# Patient Record
Sex: Female | Born: 1950 | Race: White | Hispanic: No | Marital: Married | Smoking: Former smoker
Health system: Southern US, Community
[De-identification: ages and names within clinical notes are randomized; demographics above are authoritative.]

## PROBLEM LIST (undated history)

## (undated) DIAGNOSIS — K219 Gastro-esophageal reflux disease without esophagitis: Secondary | ICD-10-CM

## (undated) DIAGNOSIS — K900 Celiac disease: Secondary | ICD-10-CM

## (undated) DIAGNOSIS — J479 Bronchiectasis, uncomplicated: Secondary | ICD-10-CM

## (undated) DIAGNOSIS — M810 Age-related osteoporosis without current pathological fracture: Secondary | ICD-10-CM

## (undated) DIAGNOSIS — K589 Irritable bowel syndrome without diarrhea: Secondary | ICD-10-CM

## (undated) DIAGNOSIS — T7840XA Allergy, unspecified, initial encounter: Secondary | ICD-10-CM

## (undated) HISTORY — DX: Irritable bowel syndrome, unspecified: K58.9

## (undated) HISTORY — PX: TONSILLECTOMY: SUR1361

## (undated) HISTORY — DX: Bronchiectasis, uncomplicated: J47.9

## (undated) HISTORY — PX: BREAST BIOPSY: SHX20

## (undated) HISTORY — DX: Age-related osteoporosis without current pathological fracture: M81.0

## (undated) HISTORY — DX: Celiac disease: K90.0

## (undated) HISTORY — DX: Allergy, unspecified, initial encounter: T78.40XA

## (undated) HISTORY — DX: Gastro-esophageal reflux disease without esophagitis: K21.9

---

## 1998-01-04 ENCOUNTER — Other Ambulatory Visit: Admission: RE | Admit: 1998-01-04 | Discharge: 1998-01-04 | Payer: Self-pay | Admitting: *Deleted

## 1998-01-09 ENCOUNTER — Other Ambulatory Visit: Admission: RE | Admit: 1998-01-09 | Discharge: 1998-01-09 | Payer: Self-pay | Admitting: *Deleted

## 1998-07-05 ENCOUNTER — Other Ambulatory Visit: Admission: RE | Admit: 1998-07-05 | Discharge: 1998-07-05 | Payer: Self-pay | Admitting: *Deleted

## 1999-05-29 ENCOUNTER — Other Ambulatory Visit: Admission: RE | Admit: 1999-05-29 | Discharge: 1999-05-29 | Payer: Self-pay | Admitting: *Deleted

## 2000-06-30 ENCOUNTER — Other Ambulatory Visit: Admission: RE | Admit: 2000-06-30 | Discharge: 2000-06-30 | Payer: Self-pay | Admitting: *Deleted

## 2002-07-14 ENCOUNTER — Other Ambulatory Visit: Admission: RE | Admit: 2002-07-14 | Discharge: 2002-07-14 | Payer: Self-pay | Admitting: *Deleted

## 2002-09-01 HISTORY — PX: COLONOSCOPY: SHX174

## 2010-08-02 ENCOUNTER — Ambulatory Visit: Payer: Self-pay | Admitting: Family Medicine

## 2010-08-04 ENCOUNTER — Ambulatory Visit: Payer: Self-pay | Admitting: Internal Medicine

## 2010-10-16 ENCOUNTER — Ambulatory Visit: Payer: Self-pay | Admitting: Internal Medicine

## 2010-10-17 ENCOUNTER — Ambulatory Visit: Payer: Self-pay | Admitting: Internal Medicine

## 2010-11-23 ENCOUNTER — Ambulatory Visit: Payer: Self-pay | Admitting: Internal Medicine

## 2010-11-24 ENCOUNTER — Emergency Department: Payer: Self-pay | Admitting: Internal Medicine

## 2013-04-01 ENCOUNTER — Ambulatory Visit: Payer: Self-pay | Admitting: Internal Medicine

## 2013-12-05 ENCOUNTER — Emergency Department: Payer: Self-pay | Admitting: Emergency Medicine

## 2013-12-05 LAB — URINALYSIS, COMPLETE
Bacteria: NONE SEEN
Bilirubin,UR: NEGATIVE
Glucose,UR: NEGATIVE mg/dL (ref 0–75)
Leukocyte Esterase: NEGATIVE
Nitrite: NEGATIVE
PROTEIN: NEGATIVE
Ph: 7 (ref 4.5–8.0)
RBC,UR: 13 /HPF (ref 0–5)
Specific Gravity: 1.011 (ref 1.003–1.030)
Squamous Epithelial: <1
WBC UR: 2 /HPF (ref 0–5)

## 2013-12-05 LAB — CBC
HCT: 40.8 % (ref 35.0–47.0)
HGB: 13.7 g/dL (ref 12.0–16.0)
MCH: 30.6 pg (ref 26.0–34.0)
MCHC: 33.6 g/dL (ref 32.0–36.0)
MCV: 91 fL (ref 80–100)
Platelet: 159 10*3/uL (ref 150–440)
RBC: 4.47 10*6/uL (ref 3.80–5.20)
RDW: 12.8 % (ref 11.5–14.5)
WBC: 12.3 10*3/uL — AB (ref 3.6–11.0)

## 2013-12-05 LAB — COMPREHENSIVE METABOLIC PANEL
ALBUMIN: 3.4 g/dL (ref 3.4–5.0)
ANION GAP: 5 — AB (ref 7–16)
Alkaline Phosphatase: 66 U/L
BILIRUBIN TOTAL: 1.4 mg/dL — AB (ref 0.2–1.0)
BUN: 9 mg/dL (ref 7–18)
CREATININE: 0.82 mg/dL (ref 0.60–1.30)
Calcium, Total: 8.5 mg/dL (ref 8.5–10.1)
Chloride: 105 mmol/L (ref 98–107)
Co2: 28 mmol/L (ref 21–32)
Glucose: 111 mg/dL — ABNORMAL HIGH (ref 65–99)
Osmolality: 275 (ref 275–301)
Potassium: 3.6 mmol/L (ref 3.5–5.1)
SGOT(AST): 29 U/L (ref 15–37)
SGPT (ALT): 27 U/L (ref 12–78)
SODIUM: 138 mmol/L (ref 136–145)
TOTAL PROTEIN: 6.6 g/dL (ref 6.4–8.2)

## 2013-12-05 LAB — TROPONIN I: Troponin-I: <0.02 ng/mL

## 2013-12-13 ENCOUNTER — Emergency Department: Payer: Self-pay | Admitting: Emergency Medicine

## 2014-06-28 ENCOUNTER — Ambulatory Visit: Payer: Self-pay | Admitting: Internal Medicine

## 2014-07-05 ENCOUNTER — Ambulatory Visit: Payer: Self-pay | Admitting: Internal Medicine

## 2014-08-03 ENCOUNTER — Ambulatory Visit: Payer: Self-pay | Admitting: Specialist

## 2014-08-14 DIAGNOSIS — R053 Chronic cough: Secondary | ICD-10-CM | POA: Insufficient documentation

## 2014-08-14 DIAGNOSIS — R05 Cough: Secondary | ICD-10-CM | POA: Insufficient documentation

## 2014-09-18 ENCOUNTER — Encounter: Payer: Self-pay | Admitting: Podiatry

## 2014-09-18 ENCOUNTER — Ambulatory Visit (INDEPENDENT_AMBULATORY_CARE_PROVIDER_SITE_OTHER): Payer: BLUE CROSS/BLUE SHIELD | Admitting: Podiatry

## 2014-09-18 VITALS — BP 131/78 | HR 68 | Resp 16 | Ht 68.0 in | Wt 128.0 lb

## 2014-09-18 DIAGNOSIS — M2040 Other hammer toe(s) (acquired), unspecified foot: Secondary | ICD-10-CM

## 2014-09-18 DIAGNOSIS — M7750 Other enthesopathy of unspecified foot: Secondary | ICD-10-CM

## 2014-09-18 DIAGNOSIS — M778 Other enthesopathies, not elsewhere classified: Secondary | ICD-10-CM

## 2014-09-18 DIAGNOSIS — Q6650 Congenital pes planus, unspecified foot: Secondary | ICD-10-CM

## 2014-09-18 DIAGNOSIS — M779 Enthesopathy, unspecified: Secondary | ICD-10-CM

## 2014-09-18 NOTE — Progress Notes (Signed)
   Subjective:    Patient ID: Audrey Hayden, female    DOB: [birth year], 64 y.o.   MRN: [id number]  HPI Comments: Need a new pair of orthotics, have a pair since 2013 from the chiropractor, got them for my back. Not having any foot pain. Just lower back pain     Review of Systems  All other systems reviewed and are negative.      Objective:   Physical Exam: I have reviewed her past medical history medications allergy surgery social history and review of systems. Pulses are palpable bilateral. Neurologic sensorium is intact bilateral. Deep tendon reflexes are intact bilaterally muscle strength +5 over 5 dorsiflexion plantar flexors and inverters everters onto the musculature is intact. Orthopedic evaluation demonstrates mild pes planus bilateral with hammertoe deformities and plantar flexed second metatarsals bilateral. Pain on palpation and range of motion of the second metatarsophalangeal joint bilaterally.        Assessment & Plan:  Assessment: Plantar fasciitis hammertoe deformity with capsulitis second metatarsophalangeal joint bilateral.  Plan: She was scanned for set of orthotics will follow up with her with those come in.

## 2014-10-09 ENCOUNTER — Ambulatory Visit (INDEPENDENT_AMBULATORY_CARE_PROVIDER_SITE_OTHER): Payer: BLUE CROSS/BLUE SHIELD | Admitting: *Deleted

## 2014-10-09 DIAGNOSIS — M778 Other enthesopathies, not elsewhere classified: Secondary | ICD-10-CM

## 2014-10-09 DIAGNOSIS — M7750 Other enthesopathy of unspecified foot: Secondary | ICD-10-CM

## 2014-10-09 DIAGNOSIS — Q6650 Congenital pes planus, unspecified foot: Secondary | ICD-10-CM

## 2014-10-09 DIAGNOSIS — M779 Enthesopathy, unspecified: Secondary | ICD-10-CM

## 2014-10-09 NOTE — Patient Instructions (Signed)

## 2014-10-16 ENCOUNTER — Ambulatory Visit: Payer: Self-pay | Admitting: Unknown Physician Specialty

## 2014-10-16 LAB — HM COLONOSCOPY

## 2014-11-06 ENCOUNTER — Ambulatory Visit: Payer: BLUE CROSS/BLUE SHIELD | Admitting: Podiatry

## 2014-11-08 DIAGNOSIS — Z8719 Personal history of other diseases of the digestive system: Secondary | ICD-10-CM | POA: Insufficient documentation

## 2014-11-13 DIAGNOSIS — IMO0001 Reserved for inherently not codable concepts without codable children: Secondary | ICD-10-CM | POA: Insufficient documentation

## 2014-12-25 LAB — SURGICAL PATHOLOGY

## 2015-02-16 ENCOUNTER — Other Ambulatory Visit: Payer: Self-pay | Admitting: Obstetrics and Gynecology

## 2015-02-16 DIAGNOSIS — N63 Unspecified lump in unspecified breast: Secondary | ICD-10-CM

## 2015-02-19 ENCOUNTER — Other Ambulatory Visit: Payer: Self-pay | Admitting: Obstetrics and Gynecology

## 2015-02-19 DIAGNOSIS — N6315 Unspecified lump in the right breast, overlapping quadrants: Secondary | ICD-10-CM

## 2015-02-19 DIAGNOSIS — N6310 Unspecified lump in the right breast, unspecified quadrant: Principal | ICD-10-CM

## 2015-02-23 ENCOUNTER — Ambulatory Visit
Admission: RE | Admit: 2015-02-23 | Discharge: 2015-02-23 | Disposition: A | Discharge status: Home / Self Care | Payer: BLUE CROSS/BLUE SHIELD | Source: Ambulatory Visit | Attending: Obstetrics and Gynecology | Admitting: Obstetrics and Gynecology

## 2015-02-23 DIAGNOSIS — R928 Other abnormal and inconclusive findings on diagnostic imaging of breast: Secondary | ICD-10-CM | POA: Insufficient documentation

## 2015-02-23 DIAGNOSIS — N6315 Unspecified lump in the right breast, overlapping quadrants: Secondary | ICD-10-CM

## 2015-02-23 DIAGNOSIS — N63 Unspecified lump in unspecified breast: Secondary | ICD-10-CM

## 2015-02-23 DIAGNOSIS — N6310 Unspecified lump in the right breast, unspecified quadrant: Secondary | ICD-10-CM

## 2015-06-19 ENCOUNTER — Other Ambulatory Visit: Payer: Self-pay | Admitting: Internal Medicine

## 2015-06-19 ENCOUNTER — Encounter: Payer: Self-pay | Admitting: Internal Medicine

## 2015-06-19 DIAGNOSIS — R87611 Atypical squamous cells cannot exclude high grade squamous intraepithelial lesion on cytologic smear of cervix (ASC-H): Secondary | ICD-10-CM

## 2015-06-19 DIAGNOSIS — E7889 Other lipoprotein metabolism disorders: Secondary | ICD-10-CM

## 2015-06-19 DIAGNOSIS — J3089 Other allergic rhinitis: Secondary | ICD-10-CM | POA: Insufficient documentation

## 2015-06-19 DIAGNOSIS — R87619 Unspecified abnormal cytological findings in specimens from cervix uteri: Secondary | ICD-10-CM | POA: Insufficient documentation

## 2015-06-19 DIAGNOSIS — M810 Age-related osteoporosis without current pathological fracture: Secondary | ICD-10-CM

## 2015-06-19 DIAGNOSIS — I83893 Varicose veins of bilateral lower extremities with other complications: Secondary | ICD-10-CM | POA: Insufficient documentation

## 2015-11-14 ENCOUNTER — Ambulatory Visit
Admission: RE | Admit: 2015-11-14 | Discharge: 2015-11-14 | Disposition: A | Discharge status: Home / Self Care | Payer: Medicare Other | Source: Ambulatory Visit | Attending: Family Medicine | Admitting: Family Medicine

## 2015-11-14 ENCOUNTER — Encounter: Payer: Self-pay | Admitting: Family Medicine

## 2015-11-14 ENCOUNTER — Telehealth: Payer: Self-pay

## 2015-11-14 ENCOUNTER — Ambulatory Visit (INDEPENDENT_AMBULATORY_CARE_PROVIDER_SITE_OTHER): Payer: Medicare Other | Admitting: Family Medicine

## 2015-11-14 VITALS — BP 112/64 | HR 78 | Temp 98.4°F | Ht 68.0 in | Wt 135.0 lb

## 2015-11-14 DIAGNOSIS — J0100 Acute maxillary sinusitis, unspecified: Secondary | ICD-10-CM | POA: Diagnosis not present

## 2015-11-14 DIAGNOSIS — R059 Cough, unspecified: Secondary | ICD-10-CM

## 2015-11-14 DIAGNOSIS — R05 Cough: Secondary | ICD-10-CM | POA: Insufficient documentation

## 2015-11-14 MED ORDER — DOXYCYCLINE HYCLATE 100 MG PO TABS: 100.0000 mg | ORAL_TABLET | Freq: Two times a day (BID) | ORAL | Status: DC

## 2015-11-14 MED ORDER — BENZONATATE 100 MG PO CAPS: 100.0000 mg | ORAL_CAPSULE | Freq: Two times a day (BID) | ORAL | Status: DC | PRN

## 2015-11-14 NOTE — Progress Notes (Signed)
Name: Audrey Hayden   MRN: [id number]    DOB: 06-28-1951   Date:11/14/2015       Progress Note  Subjective  Chief Complaint  Chief Complaint  Patient presents with  . Cough    Patient states that cough has been constant since Sunday, she states that she has been having nose running. States that she is not sleeping well due to cough. Patient reports husband is an Therapist, sports and heard "Scratching" in Right lower lobe.    Cough This is a new problem. The current episode started in the past 7 days. The problem has been gradually worsening. The cough is productive of blood-tinged sputum. Associated symptoms include chills, ear congestion, headaches, hemoptysis, nasal congestion, postnasal drip, rhinorrhea and wheezing. Pertinent negatives include no chest pain, ear pain, fever, heartburn, myalgias, rash, sore throat, shortness of breath, sweats or weight loss. There is no history of asthma, bronchiectasis, bronchitis, COPD, emphysema, environmental allergies or pneumonia.  Sinusitis This is a new problem. The problem has been gradually worsening since onset. There has been no fever. Associated symptoms include chills, congestion, coughing, headaches and sinus pressure. Pertinent negatives include no ear pain, neck pain, shortness of breath or sore throat. (Maxillary)    No problem-specific assessment & plan notes found for this encounter.   Past Medical History  Diagnosis Date  . Allergy   . Osteoporosis     Past Surgical History  Procedure Laterality Date  . Breast biopsy Left     neg  . Tonsillectomy      Family History  Problem Relation Age of Onset  . CAD Father   . Hypertension Mother     Social History   Social History  . Marital Status: Married    Spouse Name: N/A  . Number of Children: N/A  . Years of Education: N/A   Occupational History  . Not on file.   Social History Main Topics  . Smoking status: Former Research scientist (life sciences)  . Smokeless tobacco: Not on file  . Alcohol  Use: No  . Drug Use: No  . Sexual Activity: Not on file   Other Topics Concern  . Not on file   Social History Narrative    Allergies  Allergen Reactions  . Amoxicillin Other (See Comments)  . Cefdinir     Other reaction(s): Other (See Comments) Bright red face and ankle swelling: possible reaction to the medicine.   . Clarithromycin Other (See Comments)    Causes her to faint.  . Codeine Other (See Comments)    Causes her GI upset.  Marland Kitchen Hydromorphone Hcl Other (See Comments)  . Meperidine Other (See Comments)  . Moxifloxacin Other (See Comments)  . Nsaids     Other reaction(s): Unknown  . Omeprazole Other (See Comments)    Ears ringing with more than 20mg /day  . Other Other (See Comments)    All profins cause her to faint. All the mycins cause her to faint.  . Prednisone Other (See Comments)    Bright red face and ankle swelling: possible reaction to the medicine.  . Pseudoephedrine Hcl     Other reaction(s): Syncope  . Quinolones Other (See Comments)  . Ranitidine     Other reaction(s): Other (See Comments) SE noted with high dosage Dk urine and stools, HA  . Aspirin Hives and Rash  . Montelukast Sodium Palpitations     Review of Systems  Constitutional: Positive for chills. Negative for fever, weight loss and malaise/fatigue.  HENT: Positive for congestion,  postnasal drip, rhinorrhea and sinus pressure. Negative for ear discharge, ear pain and sore throat.   Eyes: Negative for blurred vision.  Respiratory: Positive for cough, hemoptysis and wheezing. Negative for sputum production and shortness of breath.   Cardiovascular: Negative for chest pain, palpitations and leg swelling.  Gastrointestinal: Negative for heartburn, nausea, abdominal pain, diarrhea, constipation, blood in stool and melena.  Genitourinary: Negative for dysuria, urgency, frequency and hematuria.  Musculoskeletal: Negative for myalgias, back pain, joint pain and neck pain.  Skin: Negative for  rash.  Neurological: Positive for headaches. Negative for dizziness, tingling, sensory change and focal weakness.  Endo/Heme/Allergies: Negative for environmental allergies and polydipsia. Does not bruise/bleed easily.  Psychiatric/Behavioral: Negative for depression and suicidal ideas. The patient is not nervous/anxious and does not have insomnia.      Objective  Filed Vitals:   11/14/15 1043  BP: 112/64  Pulse: 78  Temp: 98.4 F (36.9 C)  TempSrc: Oral  Height: 5\' 8"  (1.727 m)  Weight: 135 lb (61.236 kg)  SpO2: 97%    Physical Exam  Constitutional: She is well-developed, well-nourished, and in no distress. No distress.  HENT:  Head: Normocephalic and atraumatic.  Right Ear: External ear normal.  Left Ear: External ear normal.  Nose: Right sinus exhibits maxillary sinus tenderness. Left sinus exhibits maxillary sinus tenderness.  Mouth/Throat: Posterior oropharyngeal erythema present.  Eyes: Conjunctivae and EOM are normal. Pupils are equal, round, and reactive to light. Right eye exhibits no discharge. Left eye exhibits no discharge.  Neck: Normal range of motion. Neck supple. No JVD present. No thyromegaly present.  Cardiovascular: Normal rate, regular rhythm, normal heart sounds and intact distal pulses.  Exam reveals no gallop and no friction rub.   No murmur heard. Pulmonary/Chest: Effort normal. No accessory muscle usage. No tachypnea. No respiratory distress. She has no wheezes. She has rhonchi in the right lower field. She has rales in the right lower field.  Abdominal: Soft. Bowel sounds are normal. She exhibits no mass. There is no tenderness. There is no guarding.  Musculoskeletal: Normal range of motion. She exhibits no edema.  Lymphadenopathy:    She has no cervical adenopathy.  Neurological: She is alert. She has normal reflexes.  Skin: Skin is warm and dry. She is not diaphoretic.  Psychiatric: Mood and affect normal.  Nursing note and vitals  reviewed.     Assessment & Plan  Problem List Items Addressed This Visit    None    Visit Diagnoses    Acute maxillary sinusitis, recurrence not specified    -  Primary    Relevant Medications    doxycycline (VIBRA-TABS) 100 MG tablet    benzonatate (TESSALON) 100 MG capsule    Cough        Relevant Orders    DG Chest 2 View         Dr. Otilio Miu [facility]  11/14/2015

## 2015-11-14 NOTE — Telephone Encounter (Signed)
Spoke with patient. Patient advised of all results and verbalized understanding. Will call back with any future questions or concerns. MAH  

## 2015-11-14 NOTE — Telephone Encounter (Signed)
-----   Message from Juline Patch, MD sent at 11/14/2015  4:21 PM EDT ----- No cardiopulmonary disease

## 2015-11-14 NOTE — Telephone Encounter (Signed)
Left message for patient to call back  

## 2016-02-11 ENCOUNTER — Ambulatory Visit (INDEPENDENT_AMBULATORY_CARE_PROVIDER_SITE_OTHER): Payer: Medicare Other | Admitting: Podiatry

## 2016-02-11 ENCOUNTER — Encounter: Payer: Self-pay | Admitting: Podiatry

## 2016-02-11 VITALS — BP 123/76 | HR 74 | Resp 12

## 2016-02-11 DIAGNOSIS — M722 Plantar fascial fibromatosis: Secondary | ICD-10-CM

## 2016-02-11 DIAGNOSIS — Q6650 Congenital pes planus, unspecified foot: Secondary | ICD-10-CM

## 2016-02-11 NOTE — Progress Notes (Signed)
She presents today for chief complaint of pain to the feet. She states that recently she feels that she has had a weight loss which resulted in a change in structure of her foot which is made her previous orthotics less effective. No change in her past medical history medications or allergies.  Objective: Vital signs are stable alert and oriented 3. Pulses are palpable. No reproducible pain on palpation today.  Assessment: History of plantar fascitis.  Plan: We will have her back for scan of the orthotics Friday.

## 2016-02-15 ENCOUNTER — Ambulatory Visit (INDEPENDENT_AMBULATORY_CARE_PROVIDER_SITE_OTHER): Payer: Medicare Other | Admitting: *Deleted

## 2016-02-15 DIAGNOSIS — M722 Plantar fascial fibromatosis: Secondary | ICD-10-CM | POA: Diagnosis not present

## 2016-02-15 DIAGNOSIS — Q6650 Congenital pes planus, unspecified foot: Secondary | ICD-10-CM

## 2016-02-15 NOTE — Progress Notes (Signed)
Scanned pt for orthotics today

## 2016-03-07 ENCOUNTER — Ambulatory Visit: Payer: Medicare Other

## 2016-04-04 ENCOUNTER — Encounter: Payer: Self-pay | Admitting: *Deleted

## 2016-06-02 DIAGNOSIS — Z23 Encounter for immunization: Secondary | ICD-10-CM | POA: Diagnosis not present

## 2017-03-20 ENCOUNTER — Other Ambulatory Visit: Payer: Self-pay

## 2017-04-20 ENCOUNTER — Ambulatory Visit (INDEPENDENT_AMBULATORY_CARE_PROVIDER_SITE_OTHER): Payer: Medicare Other

## 2017-04-20 ENCOUNTER — Other Ambulatory Visit: Payer: Self-pay | Admitting: Internal Medicine

## 2017-04-20 VITALS — BP 118/68 | HR 70 | Temp 98.3°F | Resp 16 | Ht 68.0 in | Wt 127.4 lb

## 2017-04-20 DIAGNOSIS — Z1211 Encounter for screening for malignant neoplasm of colon: Secondary | ICD-10-CM

## 2017-04-20 DIAGNOSIS — Z0000 Encounter for general adult medical examination without abnormal findings: Secondary | ICD-10-CM | POA: Diagnosis not present

## 2017-04-20 DIAGNOSIS — Z1231 Encounter for screening mammogram for malignant neoplasm of breast: Secondary | ICD-10-CM

## 2017-04-20 NOTE — Progress Notes (Signed)
Subjective:   Audrey Hayden is a 66 y.o. female who presents for Medicare Annual (Subsequent) preventive examination.  Review of Systems:  Cardiac Risk Factors include: advanced age (>59men, >21 women)     Objective:     Vitals: BP 118/68 (BP Location: Left Arm, Patient Position: Sitting)   Pulse 70   Temp 98.3 F (36.8 C)   Resp 16   Ht 5\' 8"  (1.727 m)   Wt 127 lb 6.4 oz (57.8 kg)   BMI 19.37 kg/m   Body mass index is 19.37 kg/m.   Tobacco History  Smoking Status  . Former Smoker  . Quit date: 04/02/1975  Smokeless Tobacco  . Never Used     Counseling given: Not Answered   Past Medical History:  Diagnosis Date  . Allergy   . Osteoporosis    Past Surgical History:  Procedure Laterality Date  . BREAST BIOPSY Left    neg  . TONSILLECTOMY     Family History  Problem Relation Age of Onset  . Hypertension Mother   . CAD Father    History  Sexual Activity  . Sexual activity: Not on file    Outpatient Encounter Prescriptions as of 04/20/2017  Medication Sig  . B Complex-C (B-COMPLEX WITH VITAMIN C) tablet Take 1 tablet by mouth daily.  . Calcium-Phosphorus-Vitamin D (CITRACAL +D3 PO) Take by mouth 2 (two) times daily.  . cetirizine (ZYRTEC) 10 MG tablet Take by mouth.  . Magnesium Citrate 100 MG TABS Take 100 mg by mouth 2 (two) times daily.  . Manganese 10 MG TABS Take 10 mg by mouth daily.  . Misc Natural Products (OSTEO BI-FLEX ADV TRIPLE ST PO) Take by mouth.  . Multiple Vitamin (MULTI-VITAMINS) TABS Take 1 tablet by mouth daily.  Marland Kitchen PANCREATIN PO Take 200 mg by mouth 2 (two) times daily.  Marland Kitchen QUERCETIN PO Take 500 mg by mouth 2 (two) times daily.  . Saccharomyces boulardii (PROBIOTIC) 250 MG CAPS Take 1 tablet by mouth daily.  . vitamin C (ASCORBIC ACID) 500 MG tablet Take 500 mg by mouth daily.  . [DISCONTINUED] benzonatate (TESSALON) 100 MG capsule Take 1 capsule (100 mg total) by mouth 2 (two) times daily as needed for cough.  . [DISCONTINUED]  Calcium-Vitamin D 600-200 MG-UNIT tablet Take 1 tablet by mouth daily.  . [DISCONTINUED] doxycycline (VIBRA-TABS) 100 MG tablet Take 1 tablet (100 mg total) by mouth 2 (two) times daily.  . [DISCONTINUED] esomeprazole (NEXIUM) 20 MG capsule Take 1 capsule by mouth daily.   No facility-administered encounter medications on file as of 04/20/2017.     Activities of Daily Living In your present state of health, do you have any difficulty performing the following activities: 04/20/2017  Hearing? N  Vision? N  Difficulty concentrating or making decisions? N  Walking or climbing stairs? N  Dressing or bathing? N  Doing errands, shopping? N  Preparing Food and eating ? N  Using the Toilet? N  In the past six months, have you accidently leaked urine? N  Do you have problems with loss of bowel control? N  Managing your Medications? N  Managing your Finances? N  Housekeeping or managing your Housekeeping? N  Some recent data might be hidden    Patient Care Team: Glean Hess, MD as PCP - General (Family Medicine) Benjaman Kindler, MD as Consulting Physician (Obstetrics and Gynecology)    Assessment:     Exercise Activities and Dietary recommendations Current Exercise Habits: Home exercise routine,  Type of exercise: walking;stretching, Time (Minutes): 45, Frequency (Times/Week): 7, Weekly Exercise (Minutes/Week): 315, Intensity: Mild, Exercise limited by: None identified  Goals    None     Fall Risk Fall Risk  04/20/2017 11/14/2015  Falls in the past year? No No   Depression Screen PHQ 2/9 Scores 04/20/2017 11/14/2015  PHQ - 2 Score 0 0     Cognitive Function     6CIT Screen 04/20/2017  What Year? 0 points  What month? 0 points  What time? 0 points  Count back from 20 0 points  Months in reverse 0 points  Repeat phrase 0 points  Total Score 0    Immunization History  Administered Date(s) Administered  . Influenza-Unspecified 07/06/2015   Screening Tests Health  Maintenance  Topic Date Due  . INFLUENZA VACCINE  04/01/2017  . MAMMOGRAM  07/02/2017 (Originally 02/22/2017)  . Hepatitis C Screening  08/01/2017 (Originally [birth year])  . PNA vac Low Risk Adult (1 of 2 - PCV13) 08/01/2017 (Originally 07/28/2015)  . TETANUS/TDAP  05/02/2018 (Originally 07/27/1969)  . COLONOSCOPY  10/16/2024  . DEXA SCAN  Completed      Plan:     I have personally reviewed and addressed the Medicare Annual Wellness questionnaire and have noted the following in the patient's chart:  A. Medical and social history B. Use of alcohol, tobacco or illicit drugs  C. Current medications and supplements D. Functional ability and status E.  Nutritional status F.  Physical activity G. Advance directives H. List of other physicians I.  Hospitalizations, surgeries, and ER visits in previous 12 months J.  [place] such as hearing and vision if needed, cognitive and depression L. Referrals and appointments   In addition, I have reviewed and discussed with patient certain preventive protocols, quality metrics, and best practice recommendations. A written personalized care plan for preventive services as well as general preventive health recommendations were provided to patient.   Signed,  Tyler Aas, LPN Nurse Health Advisor   MD Recommendations:

## 2017-04-20 NOTE — Patient Instructions (Signed)
Audrey Hayden , Thank you for taking time to come for your Medicare Wellness Visit. I appreciate your ongoing commitment to your health goals. Please review the following plan we discussed and let me know if I can assist you in the future.   Screening recommendations/referrals: Colonoscopy: completed 10/16/2014 Mammogram: completed 02/23/2015- due now, call to schedule  Bone Density: completed 10/17/2010 Recommended yearly ophthalmology/optometry visit for glaucoma screening and checkup Recommended yearly dental visit for hygiene and checkup  Vaccinations: Influenza vaccine: due 05/2017 Pneumococcal vaccine: due now Tdap vaccine: due, check with your insurance company for coverage Shingles vaccine: due, check with your insurance company for coverage  Advanced directives: Please bring a copy of your health care power of attorney and living will to the office at your convenience.  Conditions/risks identified: none   Next appointment:  Follow up in one year for your annual wellness exam.   Preventive Care 65 Years and Older, Female Preventive care refers to lifestyle choices and visits with your health care provider that can promote health and wellness. What does preventive care include?  A yearly physical exam. This is also called an annual well check.  Dental exams once or twice a year.  Routine eye exams. Ask your health care provider how often you should have your eyes checked.  Personal lifestyle choices, including:  Daily care of your teeth and gums.  Regular physical activity.  Eating a healthy diet.  Avoiding tobacco and drug use.  Limiting alcohol use.  Practicing safe sex.  Taking low-dose aspirin every day.  Taking vitamin and mineral supplements as recommended by your health care provider. What happens during an annual well check? The services and screenings done by your health care provider during your annual well check will depend on your age, overall health,  lifestyle risk factors, and family history of disease. Counseling  Your health care provider may ask you questions about your:  Alcohol use.  Tobacco use.  Drug use.  Emotional well-being.  Home and relationship well-being.  Sexual activity.  Eating habits.  History of falls.  Memory and ability to understand (cognition).  Work and work Statistician.  Reproductive health. Screening  You may have the following tests or measurements:  Height, weight, and BMI.  Blood pressure.  Lipid and cholesterol levels. These may be checked every 5 years, or more frequently if you are over 66 years old.  Skin check.  Lung cancer screening. You may have this screening every year starting at age 16 if you have a 30-pack-year history of smoking and currently smoke or have quit within the past 15 years.  Fecal occult blood test (FOBT) of the stool. You may have this test every year starting at age 22.  Flexible sigmoidoscopy or colonoscopy. You may have a sigmoidoscopy every 5 years or a colonoscopy every 10 years starting at age 55.  Hepatitis C blood test.  Hepatitis B blood test.  Sexually transmitted disease (STD) testing.  Diabetes screening. This is done by checking your blood sugar (glucose) after you have not eaten for a while (fasting). You may have this done every 1-3 years.  Bone density scan. This is done to screen for osteoporosis. You may have this done starting at age 54.  Mammogram. This may be done every 1-2 years. Talk to your health care provider about how often you should have regular mammograms. Talk with your health care provider about your test results, treatment options, and if necessary, the need for more tests. Vaccines  Your health care provider may recommend certain vaccines, such as:  Influenza vaccine. This is recommended every year.  Tetanus, diphtheria, and acellular pertussis (Tdap, Td) vaccine. You may need a Td booster every 10 years.  Zoster  vaccine. You may need this after age 66.  Pneumococcal 13-valent conjugate (PCV13) vaccine. One dose is recommended after age 21.  Pneumococcal polysaccharide (PPSV23) vaccine. One dose is recommended after age 70. Talk to your health care provider about which screenings and vaccines you need and how often you need them. This information is not intended to replace advice given to you by your health care provider. Make sure you discuss any questions you have with your health care provider. Document Released: 09/14/2015 Document Revised: 05/07/2016 Document Reviewed: 06/19/2015 Elsevier Interactive Patient Education  2017 [place] Prevention in the Home Falls can cause injuries. They can happen to people of all ages. There are many things you can do to make your home safe and to help prevent falls. What can I do on the outside of my home?  Regularly fix the edges of walkways and driveways and fix any cracks.  Remove anything that might make you trip as you walk through a door, such as a raised step or threshold.  Trim any bushes or trees on the path to your home.  Use bright outdoor lighting.  Clear any walking paths of anything that might make someone trip, such as rocks or tools.  Regularly check to see if handrails are loose or broken. Make sure that both sides of any steps have handrails.  Any raised decks and porches should have guardrails on the edges.  Have any leaves, snow, or ice cleared regularly.  Use sand or salt on walking paths during winter.  Clean up any spills in your garage right away. This includes oil or grease spills. What can I do in the bathroom?  Use night lights.  Install grab bars by the toilet and in the tub and shower. Do not use towel bars as grab bars.  Use non-skid mats or decals in the tub or shower.  If you need to sit down in the shower, use a plastic, non-slip stool.  Keep the floor dry. Clean up any water that spills on the  floor as soon as it happens.  Remove soap buildup in the tub or shower regularly.  Attach bath mats securely with double-sided non-slip rug tape.  Do not have throw rugs and other things on the floor that can make you trip. What can I do in the bedroom?  Use night lights.  Make sure that you have a light by your bed that is easy to reach.  Do not use any sheets or blankets that are too big for your bed. They should not hang down onto the floor.  Have a firm chair that has side arms. You can use this for support while you get dressed.  Do not have throw rugs and other things on the floor that can make you trip. What can I do in the kitchen?  Clean up any spills right away.  Avoid walking on wet floors.  Keep items that you use a lot in easy-to-reach places.  If you need to reach something above you, use a strong step stool that has a grab bar.  Keep electrical cords out of the way.  Do not use floor polish or wax that makes floors slippery. If you must use wax, use non-skid floor wax.  Do  not have throw rugs and other things on the floor that can make you trip. What can I do with my stairs?  Do not leave any items on the stairs.  Make sure that there are handrails on both sides of the stairs and use them. Fix handrails that are broken or loose. Make sure that handrails are as long as the stairways.  Check any carpeting to make sure that it is firmly attached to the stairs. Fix any carpet that is loose or worn.  Avoid having throw rugs at the top or bottom of the stairs. If you do have throw rugs, attach them to the floor with carpet tape.  Make sure that you have a light switch at the top of the stairs and the bottom of the stairs. If you do not have them, ask someone to add them for you. What else can I do to help prevent falls?  Wear shoes that:  Do not have high heels.  Have rubber bottoms.  Are comfortable and fit you well.  Are closed at the toe. Do not wear  sandals.  If you use a stepladder:  Make sure that it is fully opened. Do not climb a closed stepladder.  Make sure that both sides of the stepladder are locked into place.  Ask someone to hold it for you, if possible.  Clearly mark and make sure that you can see:  Any grab bars or handrails.  First and last steps.  Where the edge of each step is.  Use tools that help you move around (mobility aids) if they are needed. These include:  Canes.  Walkers.  Scooters.  Crutches.  Turn on the lights when you go into a dark area. Replace any light bulbs as soon as they burn out.  Set up your furniture so you have a clear path. Avoid moving your furniture around.  If any of your floors are uneven, fix them.  If there are any pets around you, be aware of where they are.  Review your medicines with your doctor. Some medicines can make you feel dizzy. This can increase your chance of falling. Ask your doctor what other things that you can do to help prevent falls. This information is not intended to replace advice given to you by your health care provider. Make sure you discuss any questions you have with your health care provider. Document Released: 06/14/2009 Document Revised: 01/24/2016 Document Reviewed: 09/22/2014 Elsevier Interactive Patient Education  2017 [employer].

## 2017-05-14 ENCOUNTER — Other Ambulatory Visit: Payer: Self-pay | Admitting: Obstetrics and Gynecology

## 2017-05-14 DIAGNOSIS — Z0000 Encounter for general adult medical examination without abnormal findings: Secondary | ICD-10-CM | POA: Diagnosis not present

## 2017-05-14 DIAGNOSIS — Z01419 Encounter for gynecological examination (general) (routine) without abnormal findings: Secondary | ICD-10-CM | POA: Diagnosis not present

## 2017-05-14 DIAGNOSIS — Z1231 Encounter for screening mammogram for malignant neoplasm of breast: Secondary | ICD-10-CM

## 2017-06-10 ENCOUNTER — Ambulatory Visit
Admission: RE | Admit: 2017-06-10 | Discharge: 2017-06-10 | Disposition: A | Discharge status: Home / Self Care | Payer: Medicare Other | Source: Ambulatory Visit | Attending: Internal Medicine | Admitting: Internal Medicine

## 2017-06-10 ENCOUNTER — Ambulatory Visit (INDEPENDENT_AMBULATORY_CARE_PROVIDER_SITE_OTHER): Payer: Medicare Other | Admitting: Internal Medicine

## 2017-06-10 ENCOUNTER — Encounter: Payer: Self-pay | Admitting: Internal Medicine

## 2017-06-10 ENCOUNTER — Other Ambulatory Visit: Payer: Self-pay | Admitting: Internal Medicine

## 2017-06-10 VITALS — BP 116/64 | HR 60 | Ht 68.0 in | Wt 128.0 lb

## 2017-06-10 DIAGNOSIS — M810 Age-related osteoporosis without current pathological fracture: Secondary | ICD-10-CM | POA: Diagnosis not present

## 2017-06-10 DIAGNOSIS — M4854XA Collapsed vertebra, not elsewhere classified, thoracic region, initial encounter for fracture: Secondary | ICD-10-CM | POA: Diagnosis not present

## 2017-06-10 DIAGNOSIS — M546 Pain in thoracic spine: Secondary | ICD-10-CM | POA: Diagnosis not present

## 2017-06-10 NOTE — Progress Notes (Signed)
Date:  06/10/2017   Name:  Audrey Hayden   DOB:  26-Sep-1950   MRN:  [id number]   Chief Complaint: Back Pain (Hurt back on 05/10/2017. Reached in the back seat of the car to get water without turning body. Thought would get better. Pain is located in upper back and directly on spine. Cannot wear bra at times because elastic bothers her. Pain spreads accross back and having stinging sensation.  )  Back Pain  This is a new problem. The current episode started 1 to 4 weeks ago. The problem occurs constantly. The problem is unchanged. The pain is present in the thoracic spine. The pain is moderate. The symptoms are aggravated by twisting, coughing and bending. Associated symptoms include numbness (tingling). Pertinent negatives include no chest pain, fever or weakness. Risk factors include history of osteoporosis. She has tried analgesics and heat for the symptoms.  She is able to sleep at night with no pain. Feels pretty good in the morning but if she moves around things began to become more uncomfortable. It does not hurt to breathe or move. She is able do her usual household activities without difficulty. She has a diagnosis of osteoporosis, last bone density done in 2012. She was treated with bisphosphonates but did not tolerate them so discontinued.    Review of Systems  Constitutional: Negative for chills, fatigue and fever.  Respiratory: Negative for chest tightness, shortness of breath and wheezing.   Cardiovascular: Negative for chest pain and palpitations.  Musculoskeletal: Positive for back pain.  Neurological: Positive for numbness (tingling). Negative for dizziness and weakness.    Patient Active Problem List   Diagnosis Date Noted  . Environmental and seasonal allergies 06/19/2015  . 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 Osteoporosis 06/19/2015  . Abnormal Pap smear of cervix 06/19/2015  . Elevated HDL 06/19/2015  . Can't get food down 11/13/2014  .  History of gastritis 11/08/2014  . Cough, persistent 08/14/2014    Prior to Admission medications   Medication Sig Start Date End Date Taking? Authorizing Provider  B Complex-C (B-COMPLEX WITH VITAMIN C) tablet Take 1 tablet by mouth 2 (two) times daily.    Yes [provider]  Calcium-Phosphorus-Vitamin D (CITRACAL +D3 PO) Take by mouth 2 (two) times daily.   Yes [provider]  cetirizine (ZYRTEC) 10 MG tablet Take by mouth.   Yes [provider]  Magnesium Citrate 100 MG TABS Take 100 mg by mouth 2 (two) times daily.   Yes [provider]  Manganese 10 MG TABS Take 10 mg by mouth daily.   Yes [provider]  Misc Natural Products (OSTEO BI-FLEX ADV TRIPLE ST PO) Take by mouth.   Yes [provider]  Multiple Vitamin (MULTI-VITAMINS) TABS Take 1 tablet by mouth daily.   Yes [provider]  PANCREATIN PO Take 200 mg by mouth 2 (two) times daily.   Yes [provider]  QUERCETIN PO Take 500 mg by mouth 2 (two) times daily.   Yes [provider]  Saccharomyces boulardii (PROBIOTIC) 250 MG CAPS Take 1 tablet by mouth daily.   Yes [provider]  vitamin C (ASCORBIC ACID) 500 MG tablet Take 500 mg by mouth daily.   Yes [provider]    Allergies  Allergen Reactions  . Amoxicillin Other (See Comments)  . Cefdinir     Other reaction(s): Other (See Comments) Bright red face and ankle swelling: possible reaction to  the medicine.   . Clarithromycin Other (See Comments)    Causes her to faint.  . Codeine Other (See Comments)    Causes her GI upset.  Marland Kitchen Hydromorphone Hcl Other (See Comments)  . Meperidine Other (See Comments)  . Moxifloxacin Other (See Comments)  . Nsaids     Other reaction(s): Unknown  . Omeprazole Other (See Comments)    Ears ringing with more than 20mg /day  . Other Other (See Comments)    All profins cause her to faint. All the mycins cause her to faint.  .  Prednisone Other (See Comments)    Bright red face and ankle swelling: possible reaction to the medicine.  . Pseudoephedrine Hcl     Other reaction(s): Syncope  . Quinolones Other (See Comments)  . Ranitidine     Other reaction(s): Other (See Comments) SE noted with high dosage Dk urine and stools, HA  . Aspirin Hives and Rash  . Montelukast Sodium Palpitations    Past Surgical History:  Procedure Laterality Date  . BREAST BIOPSY Left    neg  . TONSILLECTOMY      Social History  Substance Use Topics  . Smoking status: Former Smoker    Quit date: 04/02/1975  . Smokeless tobacco: Never Used  . Alcohol use No     Medication list has been reviewed and updated.  PHQ 2/9 Scores 04/20/2017 11/14/2015  PHQ - 2 Score 0 0    Physical Exam  Constitutional: She is oriented to person, place, and time. She appears well-developed. No distress.  HENT:  Head: Normocephalic and atraumatic.  Neck: Normal range of motion. Neck supple. No thyromegaly present.  Cardiovascular: Normal rate, regular rhythm and normal heart sounds.   Pulmonary/Chest: Effort normal and breath sounds normal. No respiratory distress.  Musculoskeletal: She exhibits no edema.       Cervical back: Normal.       Thoracic back: She exhibits bony tenderness. She exhibits no spasm.  Neurological: She is alert and oriented to person, place, and time.  Skin: Skin is warm and dry. No rash noted.  Psychiatric: She has a normal mood and affect. Her behavior is normal. Thought content normal.  Nursing note and vitals reviewed.   BP 116/64   Pulse 60   Ht 5\' 8"  (1.727 m)   Wt 128 lb (58.1 kg)   SpO2 100%   BMI 19.46 kg/m   Assessment and Plan: 1. Acute midline thoracic back pain Suspect compression fracture Continue tylenol as needed with heat Consider kyphoplasty if pain is persistent - DG Thoracic Spine 4V; Future  2. Osteoporosis, unspecified osteoporosis type, unspecified pathological fracture presence Did  not tolerated oral bisphosphonates - DG Bone Density; Future   No orders of the defined types were placed in this encounter.   Partially dictated using Editor, commissioning. Any errors are unintentional.  Halina Maidens, MD [facility]  06/10/2017

## 2017-06-10 NOTE — Patient Instructions (Signed)
Spinal Compression Fracture A spinal compression fracture is a collapse of the bones that form the spine (vertebrae). With this type of fracture, the vertebrae become squashed (compressed) into a wedge shape. Most compression fractures happen in the middle or lower part of the spine. What are the causes? This condition may be caused by:  Thinning and loss of density in the bones (osteoporosis). This is the most common cause.  A fall.  A car or motorcycle accident.  Cancer.  Trauma, such as a heavy, direct hit to the head.  What increases the risk? You may be at greater risk for a spinal compression fracture if you:  Are 31 years old or older.  Have osteoporosis.  Have certain types of cancer, including: ? Multiple myeloma. ? Lymphoma. ? Prostate cancer. ? Lung cancer. ? Breast cancer.  What are the signs or symptoms? Symptoms of this condition include:  Severe pain.  Pain that gets worse over time.  Pain that is worse when you stand, walk, sit, or bend.  Sudden pain that is so bad that it is hard for you to move.  Bending or humping of the spine.  Gradual loss of height.  Numbness, tingling, or weakness in the back and legs.  Trouble walking.  Your symptoms will depend on the cause of the fracture and how quickly it develops. For example, fractures that are caused by osteoporosis can cause few symptoms, no symptoms, or symptoms that develop slowly over time. How is this diagnosed? This condition may be diagnosed based on symptoms, medical history, and a physical exam. During the physical exam, your health care provider may tap along the length of your spine to check for tenderness. Tests may be done to confirm the diagnosis. They may include:  A bone density test to check for osteoporosis.  Imaging tests, such as a spine X-ray, a CT scan, or MRI.  How is this treated? Treatment for this condition depends on the cause and severity of the condition.Some  fractures, such as those that are caused by osteoporosis, may heal on their own with supportive care. This may include:  Pain medicine.  Rest.  A back brace.  Physical therapy exercises.  Medicine that reduces bone pain.  Calcium and vitamin D supplements.  Fractures that cause the back to become misshapen, cause nerve pain or weakness, or do not respond to other treatment may be treated with a surgical procedure, such as:  Vertebroplasty. In this procedure, bone cement is injected into the collapsed vertebrae to stabilize them.  Balloon kyphoplasty. In this procedure, the collapsed vertebrae are expanded with a balloon and then bone cement is injected into them.  Spinal fusion. In this procedure, the collapsed vertebrae are connected (fused) to normal vertebrae.  Follow these instructions at home: General instructions  Take medicines only as directed by your health care provider.  Do not drive or operate heavy machinery while taking pain medicine.  If directed, apply ice to the injured area: ? Put ice in a plastic bag. ? Place a towel between your skin and the bag. ? Leave the ice on for 30 minutes every two hours at first. Then apply the ice as needed.  Wear your neck brace or back brace as directed by your health care provider.  Do not drink alcohol. Alcohol can interfere with your treatment.  Keep all follow-up visits as directed by your health care provider. This is important. It can help to prevent permanent injury, disability, and long-lasting (chronic) pain.  Activity  Stay in bed (on bed rest) only as directed by your health care provider. Being on bed rest for too long can make your condition worse.  Return to your normal activities as directed by your health care provider. Ask what activities are safe for you.  Do exercises to improve motion and strength in your back (physical therapy), as recommended by your health care provider.  Exercise regularly as  directed by your health care provider. Contact a health care provider if:  You have a fever.  You develop a cough that makes your pain worse.  Your pain medicine is not helping.  Your pain does not get better over time.  You cannot return to your normal activities as planned or expected. Get help right away if:  Your pain is very bad and it suddenly gets worse.  You are unable to move any body part (paralysis) that is below the level of your injury.  You have numbness, tingling, or weakness in any body part that is below the level of your injury.  You cannot control your bladder or bowels. This information is not intended to replace advice given to you by your health care provider. Make sure you discuss any questions you have with your health care provider. Document Released: 08/18/2005 Document Revised: 04/15/2016 Document Reviewed: 08/22/2014 Elsevier Interactive Patient Education  [employer].

## 2017-06-16 ENCOUNTER — Telehealth: Payer: Self-pay

## 2017-06-16 ENCOUNTER — Other Ambulatory Visit: Payer: Self-pay | Admitting: Internal Medicine

## 2017-06-16 DIAGNOSIS — Z23 Encounter for immunization: Secondary | ICD-10-CM | POA: Diagnosis not present

## 2017-06-16 DIAGNOSIS — M546 Pain in thoracic spine: Secondary | ICD-10-CM

## 2017-06-16 NOTE — Telephone Encounter (Signed)
Called patient and informed her MRI is scheduled for 06/30/2017 At 10:15AM here in [place]. Needs to Arrive at 9:45AM.

## 2017-06-16 NOTE — Telephone Encounter (Signed)
I have ordered an MRI of thoracic spine.

## 2017-06-16 NOTE — Telephone Encounter (Signed)
Patient informed of MRI order placed. Told someone will contact her with scheduling.

## 2017-06-16 NOTE — Telephone Encounter (Signed)
Patient called stating back pain is getting no better. Called to see if she could get a cervical, thoracic, and lumbar MRI. Her son is a doctor and she stated he wanted to know if she could have this ordered. Please Advise.

## 2017-06-26 ENCOUNTER — Ambulatory Visit
Admission: RE | Admit: 2017-06-26 | Discharge: 2017-06-26 | Disposition: A | Discharge status: Home / Self Care | Payer: Medicare Other | Source: Ambulatory Visit | Attending: Internal Medicine | Admitting: Internal Medicine

## 2017-06-26 DIAGNOSIS — M5134 Other intervertebral disc degeneration, thoracic region: Secondary | ICD-10-CM | POA: Diagnosis not present

## 2017-06-26 DIAGNOSIS — M47814 Spondylosis without myelopathy or radiculopathy, thoracic region: Secondary | ICD-10-CM | POA: Diagnosis not present

## 2017-06-26 DIAGNOSIS — D759 Disease of blood and blood-forming organs, unspecified: Secondary | ICD-10-CM | POA: Diagnosis not present

## 2017-06-26 DIAGNOSIS — M438X4 Other specified deforming dorsopathies, thoracic region: Secondary | ICD-10-CM | POA: Diagnosis not present

## 2017-06-26 DIAGNOSIS — M546 Pain in thoracic spine: Secondary | ICD-10-CM | POA: Diagnosis not present

## 2017-06-29 ENCOUNTER — Other Ambulatory Visit: Payer: Self-pay | Admitting: Internal Medicine

## 2017-06-29 ENCOUNTER — Ambulatory Visit
Admission: RE | Admit: 2017-06-29 | Discharge: 2017-06-29 | Disposition: A | Discharge status: Home / Self Care | Payer: Medicare Other | Source: Ambulatory Visit | Attending: Internal Medicine | Admitting: Internal Medicine

## 2017-06-29 DIAGNOSIS — Z780 Asymptomatic menopausal state: Secondary | ICD-10-CM | POA: Diagnosis not present

## 2017-06-29 DIAGNOSIS — R937 Abnormal findings on diagnostic imaging of other parts of musculoskeletal system: Secondary | ICD-10-CM

## 2017-06-29 DIAGNOSIS — M810 Age-related osteoporosis without current pathological fracture: Secondary | ICD-10-CM | POA: Insufficient documentation

## 2017-06-29 DIAGNOSIS — E2839 Other primary ovarian failure: Secondary | ICD-10-CM | POA: Diagnosis not present

## 2017-06-29 NOTE — Progress Notes (Signed)
Patient informed of MRI. Agrees to see Neurosurgery for consultation.

## 2017-06-30 ENCOUNTER — Ambulatory Visit: Admission: RE | Admit: 2017-06-30 | Payer: Medicare Other | Source: Ambulatory Visit

## 2017-07-08 DIAGNOSIS — Z0000 Encounter for general adult medical examination without abnormal findings: Secondary | ICD-10-CM | POA: Diagnosis not present

## 2017-07-09 DIAGNOSIS — M4850XA Collapsed vertebra, not elsewhere classified, site unspecified, initial encounter for fracture: Secondary | ICD-10-CM | POA: Diagnosis not present

## 2017-07-09 DIAGNOSIS — M899 Disorder of bone, unspecified: Secondary | ICD-10-CM | POA: Diagnosis not present

## 2017-07-09 DIAGNOSIS — S22060A Wedge compression fracture of T7-T8 vertebra, initial encounter for closed fracture: Secondary | ICD-10-CM | POA: Diagnosis not present

## 2017-07-10 ENCOUNTER — Ambulatory Visit
Admission: RE | Admit: 2017-07-10 | Discharge: 2017-07-10 | Disposition: A | Discharge status: Home / Self Care | Payer: Medicare Other | Source: Ambulatory Visit | Attending: Obstetrics and Gynecology | Admitting: Obstetrics and Gynecology

## 2017-07-10 DIAGNOSIS — Z1231 Encounter for screening mammogram for malignant neoplasm of breast: Secondary | ICD-10-CM | POA: Insufficient documentation

## 2017-07-15 ENCOUNTER — Ambulatory Visit (INDEPENDENT_AMBULATORY_CARE_PROVIDER_SITE_OTHER): Payer: Medicare Other | Admitting: Internal Medicine

## 2017-07-15 ENCOUNTER — Encounter: Payer: Self-pay | Admitting: Internal Medicine

## 2017-07-15 VITALS — BP 120/72 | HR 68 | Ht 68.0 in | Wt 128.0 lb

## 2017-07-15 DIAGNOSIS — M8000XD Age-related osteoporosis with current pathological fracture, unspecified site, subsequent encounter for fracture with routine healing: Secondary | ICD-10-CM | POA: Diagnosis not present

## 2017-07-15 MED ORDER — ALENDRONATE SODIUM 70 MG PO TABS: 70.0000 mg | ORAL_TABLET | ORAL | 11 refills | Status: DC

## 2017-07-15 NOTE — Patient Instructions (Addendum)
Calcium 1200 mg per day Vitamin D 800 IU per day Plan to repeat DEXA in 2 years

## 2017-07-15 NOTE — Progress Notes (Signed)
Date:  07/15/2017   Name:  Audrey Hayden   DOB:  21-Mar-1951   MRN:  [id number]   Chief Complaint: Osteoporosis (Wants to discuss possibility to be put on bone medicine. ) Recent thoracic compression fracture and bone density showing osteoporosis.  She takes calcium and vitamin D daily. She took Evista in the past but had sinus congestion and severe hot flashes. She has never taken Fosamax.  She does not have symptomatic reflux currently but had issues in the past.    Review of Systems  Constitutional: Negative for chills, fatigue and fever.  Respiratory: Negative for chest tightness and shortness of breath.   Cardiovascular: Negative for chest pain.  Gastrointestinal: Negative for abdominal pain, blood in stool, constipation and diarrhea.  Musculoskeletal: Positive for back pain.  Neurological: Negative for dizziness and headaches.    Patient Active Problem List   Diagnosis Date Noted  . Environmental and seasonal allergies 06/19/2015  . 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 Osteoporosis 06/19/2015  . Abnormal Pap smear of cervix 06/19/2015  . Elevated HDL 06/19/2015  . Can't get food down 11/13/2014  . History of gastritis 11/08/2014  . Cough, persistent 08/14/2014    Prior to Admission medications   Medication Sig Start Date End Date Taking? Authorizing Provider  B Complex-C (B-COMPLEX WITH VITAMIN C) tablet Take 1 tablet by mouth 2 (two) times daily.    Yes [provider]  Calcium-Phosphorus-Vitamin D (CITRACAL +D3 PO) Take by mouth 2 (two) times daily.   Yes [provider]  cetirizine (ZYRTEC) 10 MG tablet Take by mouth.   Yes [provider]  Magnesium Citrate 100 MG TABS Take 100 mg by mouth 2 (two) times daily.   Yes [provider]  Manganese 10 MG TABS Take 10 mg by mouth daily.   Yes [provider]  Misc Natural Products (OSTEO BI-FLEX ADV TRIPLE ST PO) Take by mouth.   Yes [provider]  Multiple Vitamin (MULTI-VITAMINS) TABS Take 1 tablet by mouth daily.   Yes [provider]  Omega-3 Fatty Acids (FISH OIL) 1000 MG CAPS Take by mouth.   Yes [provider]  PANCREATIN PO Take 200 mg by mouth 2 (two) times daily.   Yes [provider]  QUERCETIN PO Take 500 mg by mouth 2 (two) times daily.   Yes [provider]  Saccharomyces boulardii (PROBIOTIC) 250 MG CAPS Take 1 tablet by mouth daily.   Yes [provider]  vitamin C (ASCORBIC ACID) 500 MG tablet Take 500 mg by mouth daily.   Yes [provider]    Allergies  Allergen Reactions  . Amoxicillin Other (See Comments)  . Cefdinir     Other reaction(s): Other (See Comments) Bright red face and ankle swelling: possible reaction to the medicine.   . Clarithromycin Other (See Comments)    Causes her to faint.  . Codeine Other (See Comments)    Causes her GI upset.  Marland Kitchen Hydromorphone Hcl Other (See Comments)  . Meperidine Other (See Comments)  . Moxifloxacin Other (See Comments)  . Nsaids     Other reaction(s): Unknown  . Omeprazole Other (See Comments)    Ears ringing with more than 20mg /day  . Other Other (See Comments)    All profins cause her to faint. All the mycins cause her to faint.  . Prednisone Other (See Comments)    Bright red face and ankle swelling: possible reaction to the  medicine.  . Pseudoephedrine Hcl     Other reaction(s): Syncope  . Quinolones Other (See Comments)  . Ranitidine     Other reaction(s): Other (See Comments) SE noted with high dosage Dk urine and stools, HA  . Aspirin Hives and Rash  . Montelukast Sodium Palpitations    Past Surgical History:  Procedure Laterality Date  . BREAST BIOPSY Left    neg  . TONSILLECTOMY      Social History   Tobacco Use  . Smoking status: Former Smoker    Last attempt to quit: 04/02/1975    Years since quitting: 42.3  . Smokeless tobacco: Never Used  Substance Use Topics  . Alcohol  use: No    Alcohol/week: 0.0 oz  . Drug use: No     Medication list has been reviewed and updated.  PHQ 2/9 Scores 04/20/2017 11/14/2015  PHQ - 2 Score 0 0    Physical Exam  Constitutional: She is oriented to person, place, and time. She appears well-developed. No distress.  HENT:  Head: Normocephalic and atraumatic.  Cardiovascular: Normal rate, regular rhythm and normal heart sounds.  Pulmonary/Chest: Effort normal and breath sounds normal. No respiratory distress.  Musculoskeletal: Normal range of motion.  Neurological: She is alert and oriented to person, place, and time.  Skin: Skin is warm and dry. No rash noted.  Psychiatric: She has a normal mood and affect. Her behavior is normal. Thought content normal.  Nursing note and vitals reviewed.   BP 120/72   Pulse 68   Ht 5\' 8"  (1.727 m)   Wt 128 lb (58.1 kg)   SpO2 99%   BMI 19.46 kg/m   Assessment and Plan: 1. Age-related osteoporosis with current pathological fracture with routine healing, subsequent encounter Continue calcium and vitamin - alendronate (FOSAMAX) 70 MG tablet; Take 1 tablet (70 mg total) every 7 (seven) days by mouth. Take with a full glass of water on an empty stomach.  Dispense: 4 tablet; Refill: 11   Meds ordered this encounter  Medications  . alendronate (FOSAMAX) 70 MG tablet    Sig: Take 1 tablet (70 mg total) every 7 (seven) days by mouth. Take with a full glass of water on an empty stomach.    Dispense:  4 tablet    Refill:  11    Partially dictated using Editor, commissioning. Any errors are unintentional.  Halina Maidens, MD [facility]  07/15/2017

## 2017-08-06 DIAGNOSIS — M4850XA Collapsed vertebra, not elsewhere classified, site unspecified, initial encounter for fracture: Secondary | ICD-10-CM | POA: Diagnosis not present

## 2017-08-06 DIAGNOSIS — S22060A Wedge compression fracture of T7-T8 vertebra, initial encounter for closed fracture: Secondary | ICD-10-CM | POA: Diagnosis not present

## 2017-08-07 DIAGNOSIS — Z1211 Encounter for screening for malignant neoplasm of colon: Secondary | ICD-10-CM | POA: Diagnosis not present

## 2017-08-18 ENCOUNTER — Telehealth: Payer: Self-pay

## 2017-08-18 NOTE — Telephone Encounter (Signed)
Patient left VM- stating she has been taking Fosomax for 4 weeks now. Taking it as directed and following all instructions but having major heartburn from the medication. Informed pt it is okay to stop the medication per Dr Army Melia since she is having these affects from it.  Patient stated she would like Vit D levels checked- informed her Dr Army Melia does not typically do labs without an office visit to discuss first. She verbalized understanding and stated she will schedule appt.

## 2017-08-21 ENCOUNTER — Other Ambulatory Visit: Payer: Self-pay | Admitting: Internal Medicine

## 2017-08-21 ENCOUNTER — Ambulatory Visit (INDEPENDENT_AMBULATORY_CARE_PROVIDER_SITE_OTHER): Payer: Medicare Other | Admitting: Internal Medicine

## 2017-08-21 ENCOUNTER — Encounter: Payer: Self-pay | Admitting: Internal Medicine

## 2017-08-21 VITALS — BP 98/60 | HR 69 | Ht 68.0 in | Wt 128.0 lb

## 2017-08-21 DIAGNOSIS — E559 Vitamin D deficiency, unspecified: Secondary | ICD-10-CM

## 2017-08-21 DIAGNOSIS — M8000XD Age-related osteoporosis with current pathological fracture, unspecified site, subsequent encounter for fracture with routine healing: Secondary | ICD-10-CM | POA: Diagnosis not present

## 2017-08-21 NOTE — Patient Instructions (Signed)
Consider  IV Reclast, Prolia - self injection and IV Boniva

## 2017-08-21 NOTE — Progress Notes (Signed)
Date:  08/21/2017   Name:  Audrey Hayden   DOB:  July 05, 1951   MRN:  [id number]   Chief Complaint: Labs Only (Wants Vitamin D levels checked. ) and Osteoporosis (Took fosomax for 4 weeks and acid reflux was too strong to continue taking. Did not want to have to nexium again.)  Osteoporosis - started Fosamax last month but could not tolerate it due to reflux, cough and some difficulty swallowing.  She will consider alternative therapy.  Meanwhile, continue calcium and vitamin D daily.  Will check vitamin D level.  Review of Systems  Constitutional: Negative for chills, fatigue and fever.  Respiratory: Negative for chest tightness and shortness of breath.   Cardiovascular: Negative for chest pain and palpitations.  Musculoskeletal: Positive for back pain.    Patient Active Problem List   Diagnosis Date Noted  . Environmental and seasonal allergies 06/19/2015  . 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 Osteoporosis 06/19/2015  . Abnormal Pap smear of cervix 06/19/2015  . Elevated HDL 06/19/2015  . History of gastritis 11/08/2014  . Cough, persistent 08/14/2014    Prior to Admission medications   Medication Sig Start Date End Date Taking? Authorizing Provider  B Complex-C (B-COMPLEX WITH VITAMIN C) tablet Take 1 tablet by mouth 2 (two) times daily.    Yes [provider]  Calcium-Phosphorus-Vitamin D (CITRACAL +D3 PO) Take by mouth 2 (two) times daily.   Yes [provider]  cetirizine (ZYRTEC) 10 MG tablet Take by mouth.   Yes [provider]  Magnesium Citrate 100 MG TABS Take 100 mg by mouth 2 (two) times daily.   Yes [provider]  Misc Natural Products (OSTEO BI-FLEX ADV TRIPLE ST PO) Take by mouth.   Yes [provider]  Multiple Vitamin (MULTI-VITAMINS) TABS Take 1 tablet by mouth daily.   Yes [provider]  Omega-3 Fatty Acids (FISH OIL) 1000 MG CAPS Take by mouth.   Yes [provider]    PANCREATIN PO Take 200 mg by mouth 2 (two) times daily.   Yes [provider]  QUERCETIN PO Take 500 mg by mouth 2 (two) times daily.   Yes [provider]  Saccharomyces boulardii (PROBIOTIC) 250 MG CAPS Take 1 tablet by mouth daily.   Yes [provider]  vitamin C (ASCORBIC ACID) 500 MG tablet Take 500 mg by mouth daily.   Yes [provider]    Allergies  Allergen Reactions  . Amoxicillin Other (See Comments)  . Cefdinir     Other reaction(s): Other (See Comments) Bright red face and ankle swelling: possible reaction to the medicine.   . Clarithromycin Other (See Comments)    Causes her to faint.  . Codeine Other (See Comments)    Causes her GI upset.  Marland Kitchen Hydromorphone Hcl Other (See Comments)  . Meperidine Other (See Comments)  . Moxifloxacin Other (See Comments)  . Nsaids     Other reaction(s): Unknown  . Omeprazole Other (See Comments)    Ears ringing with more than 20mg /day  . Other Other (See Comments)    All profins cause her to faint. All the mycins cause her to faint.  . Prednisone Other (See Comments)    Bright red face and ankle swelling: possible reaction to the medicine.  . Pseudoephedrine Hcl     Other reaction(s): Syncope  . Quinolones Other (See Comments)  . Ranitidine     Other reaction(s): Other (See Comments) SE noted  with high dosage Dk urine and stools, HA  . Aspirin Hives and Rash  . Montelukast Sodium Palpitations    Past Surgical History:  Procedure Laterality Date  . BREAST BIOPSY Left    neg  . TONSILLECTOMY      Social History   Tobacco Use  . Smoking status: Former Smoker    Last attempt to quit: 04/02/1975    Years since quitting: 42.4  . Smokeless tobacco: Never Used  Substance Use Topics  . Alcohol use: No    Alcohol/week: 0.0 oz  . Drug use: No     Medication list has been reviewed and updated.  PHQ 2/9 Scores 04/20/2017 11/14/2015  PHQ - 2 Score 0 0    Physical Exam  Constitutional:  She is oriented to person, place, and time. She appears well-developed. No distress.  HENT:  Head: Normocephalic and atraumatic.  Cardiovascular: Normal rate, regular rhythm and normal heart sounds.  Pulmonary/Chest: Effort normal and breath sounds normal. No respiratory distress. She has no wheezes.  Musculoskeletal: She exhibits no edema.  Neurological: She is alert and oriented to person, place, and time.  Skin: Skin is warm and dry. No rash noted.  Psychiatric: She has a normal mood and affect. Her behavior is normal. Thought content normal.  Nursing note and vitals reviewed.   BP 98/60   Pulse 69   Ht 5\' 8"  (1.727 m)   Wt 128 lb (58.1 kg)   SpO2 98%   BMI 19.46 kg/m   Assessment and Plan: 1. Age-related osteoporosis with current pathological fracture with routine healing, subsequent encounter Will order Vitamin D level Continue current supplement 1000 IU daily  2. Vitamin D deficiency disease    No orders of the defined types were placed in this encounter.   Partially dictated using Editor, commissioning. Any errors are unintentional.  Halina Maidens, MD [facility]  08/21/2017

## 2017-08-22 LAB — VITAMIN D 25 HYDROXY (VIT D DEFICIENCY, FRACTURES): VIT D 25 HYDROXY: 63.4 ng/mL (ref 30.0–100.0)

## 2017-08-26 ENCOUNTER — Telehealth: Payer: Self-pay

## 2017-08-26 NOTE — Telephone Encounter (Signed)
Patient informed of normal vit D levels on recent lab- informed to continue same supplements.

## 2017-08-28 ENCOUNTER — Other Ambulatory Visit: Payer: Self-pay | Admitting: Neurosurgery

## 2017-08-28 DIAGNOSIS — M899 Disorder of bone, unspecified: Secondary | ICD-10-CM

## 2017-09-10 ENCOUNTER — Ambulatory Visit (INDEPENDENT_AMBULATORY_CARE_PROVIDER_SITE_OTHER): Payer: Medicare Other | Admitting: Internal Medicine

## 2017-09-10 ENCOUNTER — Encounter: Payer: Self-pay | Admitting: Internal Medicine

## 2017-09-10 VITALS — BP 104/62 | HR 78 | Temp 98.2°F | Ht 68.0 in | Wt 128.0 lb

## 2017-09-10 DIAGNOSIS — J019 Acute sinusitis, unspecified: Secondary | ICD-10-CM | POA: Diagnosis not present

## 2017-09-10 MED ORDER — DOXYCYCLINE HYCLATE 100 MG PO TABS
100.0000 mg | ORAL_TABLET | Freq: Two times a day (BID) | ORAL | 0 refills | Status: DC
Start: 2017-09-10 — End: 2017-09-14

## 2017-09-10 NOTE — Progress Notes (Signed)
Date:  09/10/2017   Name:  Audrey Hayden   DOB:  December 10, 1950   MRN:  [id number]   Chief Complaint: Cough (Started 3 days ago- coughing up brown mucous. Now its clear. Facial congestion. Felt colder than usual like has a fever. Extreme fatigue yesterday. Sore throat- red in back of throat and hurts to swallow. )  Sinusitis  This is a new problem. The current episode started in the past 7 days. The problem has been gradually worsening since onset. There has been no fever. The pain is moderate. Associated symptoms include congestion, coughing, diaphoresis, headaches, sinus pressure and a sore throat.     Review of Systems  Constitutional: Positive for diaphoresis.  HENT: Positive for congestion, sinus pressure, sore throat and voice change. Negative for trouble swallowing.   Eyes: Negative for visual disturbance.  Respiratory: Positive for cough.   Cardiovascular: Negative for chest pain and palpitations.  Neurological: Positive for headaches. Negative for dizziness, tremors and numbness.    Patient Active Problem List   Diagnosis Date Noted  . Environmental and seasonal allergies 06/19/2015  . 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 Osteoporosis 06/19/2015  . Abnormal Pap smear of cervix 06/19/2015  . Elevated HDL 06/19/2015  . History of gastritis 11/08/2014  . Cough, persistent 08/14/2014    Prior to Admission medications   Medication Sig Start Date End Date Taking? Authorizing Provider  B Complex-C (B-COMPLEX WITH VITAMIN C) tablet Take 1 tablet by mouth 2 (two) times daily.    Yes [provider]  Calcium-Phosphorus-Vitamin D (CITRACAL +D3 PO) Take by mouth 2 (two) times daily.   Yes [provider]  Magnesium Citrate 100 MG TABS Take 100 mg by mouth 2 (two) times daily.   Yes [provider]  Misc Natural Products (OSTEO BI-FLEX ADV TRIPLE ST PO) Take by mouth.   Yes [provider]  Multiple Vitamin (MULTI-VITAMINS)  TABS Take 1 tablet by mouth daily.   Yes [provider]  Omega-3 Fatty Acids (FISH OIL) 1000 MG CAPS Take by mouth.   Yes [provider]  PANCREATIN PO Take 200 mg by mouth 2 (two) times daily.   Yes [provider]  QUERCETIN PO Take 500 mg by mouth 2 (two) times daily.   Yes [provider]  Saccharomyces boulardii (PROBIOTIC) 250 MG CAPS Take 1 tablet by mouth daily.   Yes [provider]  vitamin C (ASCORBIC ACID) 500 MG tablet Take 500 mg by mouth daily.   Yes [provider]    Allergies  Allergen Reactions  . Amoxicillin Other (See Comments)  . Cefdinir     Other reaction(s): Other (See Comments) Bright red face and ankle swelling: possible reaction to the medicine.   . Clarithromycin Other (See Comments)    Causes her to faint.  . Codeine Other (See Comments)    Causes her GI upset.  Marland Kitchen Hydromorphone Hcl Other (See Comments)  . Meperidine Other (See Comments)  . Moxifloxacin Other (See Comments)  . Nsaids     Other reaction(s): Unknown  . Omeprazole Other (See Comments)    Ears ringing with more than 20mg /day  . Other Other (See Comments)    All profins cause her to faint. All the mycins cause her to faint.  . Prednisone Other (See Comments)    Bright red face and ankle swelling: possible reaction to the medicine.  . Pseudoephedrine Hcl     Other reaction(s): Syncope  .  Quinolones Other (See Comments)  . Ranitidine     Other reaction(s): Other (See Comments) SE noted with high dosage Dk urine and stools, HA  . Aspirin Hives and Rash  . Montelukast Sodium Palpitations    Past Surgical History:  Procedure Laterality Date  . BREAST BIOPSY Left    neg  . TONSILLECTOMY      Social History   Tobacco Use  . Smoking status: Former Smoker    Last attempt to quit: 04/02/1975    Years since quitting: 42.4  . Smokeless tobacco: Never Used  Substance Use Topics  . Alcohol use: No    Alcohol/week: 0.0 oz  . Drug  use: No     Medication list has been reviewed and updated.  PHQ 2/9 Scores 04/20/2017 11/14/2015  PHQ - 2 Score 0 0    Physical Exam  Constitutional: She is oriented to person, place, and time. She appears well-developed and well-nourished.  HENT:  Right Ear: External ear and ear canal normal. Tympanic membrane is not erythematous and not retracted.  Left Ear: External ear and ear canal normal. Tympanic membrane is not erythematous and not retracted.  Nose: Right sinus exhibits maxillary sinus tenderness. Right sinus exhibits no frontal sinus tenderness. Left sinus exhibits maxillary sinus tenderness. Left sinus exhibits no frontal sinus tenderness.  Mouth/Throat: Uvula is midline and mucous membranes are normal. No oral lesions. Posterior oropharyngeal erythema present. No oropharyngeal exudate or posterior oropharyngeal edema.  Cardiovascular: Normal rate, regular rhythm and normal heart sounds.  Pulmonary/Chest: Breath sounds normal. She has no wheezes. She has no rales.  Lymphadenopathy:    She has no cervical adenopathy.  Neurological: She is alert and oriented to person, place, and time.    BP 104/62   Pulse 78   Temp 98.2 F (36.8 C) (Oral)   Ht 5\' 8"  (1.727 m)   Wt 128 lb (58.1 kg)   SpO2 100%   BMI 19.46 kg/m   Assessment and Plan: 1. Acute non-recurrent sinusitis, unspecified location Mucinex and fluids - doxycycline (VIBRA-TABS) 100 MG tablet; Take 1 tablet (100 mg total) by mouth 2 (two) times daily for 10 days.  Dispense: 20 tablet; Refill: 0   Meds ordered this encounter  Medications  . doxycycline (VIBRA-TABS) 100 MG tablet    Sig: Take 1 tablet (100 mg total) by mouth 2 (two) times daily for 10 days.    Dispense:  20 tablet    Refill:  0    Partially dictated using Editor, commissioning. Any errors are unintentional.  Halina Maidens, MD [facility]  09/10/2017

## 2017-09-14 ENCOUNTER — Other Ambulatory Visit: Payer: Self-pay

## 2017-09-14 ENCOUNTER — Ambulatory Visit
Admission: RE | Admit: 2017-09-14 | Discharge: 2017-09-14 | Disposition: A | Discharge status: Home / Self Care | Payer: Medicare Other | Source: Ambulatory Visit | Attending: Internal Medicine | Admitting: Internal Medicine

## 2017-09-14 ENCOUNTER — Other Ambulatory Visit
Admission: RE | Admit: 2017-09-14 | Discharge: 2017-09-14 | Disposition: A | Discharge status: Home / Self Care | Payer: Medicare Other | Source: Ambulatory Visit | Attending: Internal Medicine | Admitting: Internal Medicine

## 2017-09-14 ENCOUNTER — Ambulatory Visit
Admission: EM | Admit: 2017-09-14 | Discharge: 2017-09-14 | Disposition: A | Discharge status: Home / Self Care | Payer: Medicare Other | Attending: Family Medicine | Admitting: Family Medicine

## 2017-09-14 ENCOUNTER — Ambulatory Visit (INDEPENDENT_AMBULATORY_CARE_PROVIDER_SITE_OTHER): Payer: Medicare Other | Admitting: Internal Medicine

## 2017-09-14 ENCOUNTER — Encounter: Payer: Self-pay | Admitting: Internal Medicine

## 2017-09-14 VITALS — BP 120/76 | HR 93 | Temp 97.6°F | Ht 68.0 in | Wt 128.0 lb

## 2017-09-14 DIAGNOSIS — J189 Pneumonia, unspecified organism: Secondary | ICD-10-CM

## 2017-09-14 DIAGNOSIS — M810 Age-related osteoporosis without current pathological fracture: Secondary | ICD-10-CM | POA: Diagnosis not present

## 2017-09-14 DIAGNOSIS — Z880 Allergy status to penicillin: Secondary | ICD-10-CM | POA: Diagnosis not present

## 2017-09-14 DIAGNOSIS — Z8249 Family history of ischemic heart disease and other diseases of the circulatory system: Secondary | ICD-10-CM | POA: Insufficient documentation

## 2017-09-14 DIAGNOSIS — E871 Hypo-osmolality and hyponatremia: Secondary | ICD-10-CM | POA: Diagnosis not present

## 2017-09-14 DIAGNOSIS — Z79899 Other long term (current) drug therapy: Secondary | ICD-10-CM | POA: Insufficient documentation

## 2017-09-14 DIAGNOSIS — Z87891 Personal history of nicotine dependence: Secondary | ICD-10-CM | POA: Insufficient documentation

## 2017-09-14 DIAGNOSIS — Z885 Allergy status to narcotic agent status: Secondary | ICD-10-CM | POA: Diagnosis not present

## 2017-09-14 DIAGNOSIS — J440 Chronic obstructive pulmonary disease with acute lower respiratory infection: Secondary | ICD-10-CM | POA: Insufficient documentation

## 2017-09-14 DIAGNOSIS — Z886 Allergy status to analgesic agent status: Secondary | ICD-10-CM | POA: Insufficient documentation

## 2017-09-14 DIAGNOSIS — J181 Lobar pneumonia, unspecified organism: Secondary | ICD-10-CM

## 2017-09-14 DIAGNOSIS — R059 Cough, unspecified: Secondary | ICD-10-CM

## 2017-09-14 DIAGNOSIS — Z888 Allergy status to other drugs, medicaments and biological substances status: Secondary | ICD-10-CM | POA: Insufficient documentation

## 2017-09-14 DIAGNOSIS — R05 Cough: Secondary | ICD-10-CM

## 2017-09-14 DIAGNOSIS — Z9889 Other specified postprocedural states: Secondary | ICD-10-CM | POA: Insufficient documentation

## 2017-09-14 DIAGNOSIS — R0602 Shortness of breath: Secondary | ICD-10-CM | POA: Diagnosis not present

## 2017-09-14 DIAGNOSIS — R5383 Other fatigue: Secondary | ICD-10-CM

## 2017-09-14 DIAGNOSIS — J0100 Acute maxillary sinusitis, unspecified: Secondary | ICD-10-CM

## 2017-09-14 LAB — CBC WITH DIFFERENTIAL/PLATELET
BASOS ABS: 0 10*3/uL (ref 0–0.1)
BASOS PCT: 0 %
EOS ABS: 0.1 10*3/uL (ref 0–0.7)
Eosinophils Relative: 1 %
HEMATOCRIT: 36.6 % (ref 35.0–47.0)
Hemoglobin: 12.6 g/dL (ref 12.0–16.0)
Lymphocytes Relative: 11 %
Lymphs Abs: 0.6 10*3/uL — ABNORMAL LOW (ref 1.0–3.6)
MCH: 31.3 pg (ref 26.0–34.0)
MCHC: 34.5 g/dL (ref 32.0–36.0)
MCV: 90.6 fL (ref 80.0–100.0)
MONO ABS: 0.5 10*3/uL (ref 0.2–0.9)
Monocytes Relative: 9 %
NEUTROS ABS: 4.3 10*3/uL (ref 1.4–6.5)
NEUTROS PCT: 79 %
Platelets: 219 10*3/uL (ref 150–440)
RBC: 4.04 MIL/uL (ref 3.80–5.20)
RDW: 12.3 % (ref 11.5–14.5)
WBC: 5.5 10*3/uL (ref 3.6–11.0)

## 2017-09-14 LAB — POC URINALYSIS WITH MICROSCOPIC (NON AUTO)MANUAL RESULT
BACTERIA UA: 0
Bilirubin, UA: NEGATIVE
Crystals: 0
EPITHELIAL CELLS, URINE PER MICROSCOPY: 0
Glucose, UA: NEGATIVE
KETONES UA: NEGATIVE
Leukocytes, UA: NEGATIVE
MUCUS UA: 0
Nitrite, UA: NEGATIVE
PH UA: 6.5 (ref 5.0–8.0)
Protein, UA: NEGATIVE
RBC: 0 M/uL — AB (ref 4.04–5.48)
Spec Grav, UA: 1.01 (ref 1.010–1.025)
Urobilinogen, UA: 0.2 E.U./dL
WBC CASTS UA: 0

## 2017-09-14 LAB — COMPREHENSIVE METABOLIC PANEL
ALT: 36 U/L (ref 14–54)
AST: 31 U/L (ref 15–41)
Albumin: 3.5 g/dL (ref 3.5–5.0)
Alkaline Phosphatase: 76 U/L (ref 38–126)
Anion gap: 9 (ref 5–15)
BILIRUBIN TOTAL: 1.1 mg/dL (ref 0.3–1.2)
BUN: 7 mg/dL (ref 6–20)
CALCIUM: 8.4 mg/dL — AB (ref 8.9–10.3)
CO2: 26 mmol/L (ref 22–32)
CREATININE: 0.64 mg/dL (ref 0.44–1.00)
Chloride: 92 mmol/L — ABNORMAL LOW (ref 101–111)
GFR calc non Af Amer: >60 mL/min (ref >60)
Glucose, Bld: 113 mg/dL — ABNORMAL HIGH (ref 65–99)
Potassium: 3.9 mmol/L (ref 3.5–5.1)
SODIUM: 127 mmol/L — AB (ref 135–145)
Total Protein: 6.9 g/dL (ref 6.5–8.1)

## 2017-09-14 MED ORDER — LEVOFLOXACIN 500 MG PO TABS
500.0000 mg | ORAL_TABLET | Freq: Once | ORAL | Status: AC
  Administered 2017-09-14: 500 mg via ORAL

## 2017-09-14 MED ORDER — LEVOFLOXACIN 500 MG PO TABS: 500.0000 mg | ORAL_TABLET | Freq: Every day | ORAL | 0 refills | Status: DC

## 2017-09-14 NOTE — ED Provider Notes (Signed)
[facility]    CSN: [id number] Arrival date & time: 09/14/17  1045     History   Chief Complaint Chief Complaint  Patient presents with  . Shortness of Breath    HPI Audrey Hayden is a 67 y.o. female.   67 yo female with a c/o shortness of breath for the past week. Seen by PCP 5 days ago and started on doxycycline. Patient states that she has not improved and is not tolerating the doxycycline well. Saw her PCP again today who ordered blood tests and chest x-ray. Patient came in to urgent care because she was "scared" and felt she needed to seek urgent treatment.    The history is provided by the patient.  Shortness of Breath    Past Medical History:  Diagnosis Date  . Allergy   . Osteoporosis     Patient Active Problem List   Diagnosis Date Noted  . Environmental and seasonal allergies 06/19/2015  . 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 Osteoporosis 06/19/2015  . Abnormal Pap smear of cervix 06/19/2015  . Elevated HDL 06/19/2015  . History of gastritis 11/08/2014  . Cough, persistent 08/14/2014    Past Surgical History:  Procedure Laterality Date  . BREAST BIOPSY Left    neg  . TONSILLECTOMY      OB History    No data available       Home Medications    Prior to Admission medications   Medication Sig Start Date End Date Taking? Authorizing Provider  B Complex-C (B-COMPLEX WITH VITAMIN C) tablet Take 1 tablet by mouth 2 (two) times daily.    Yes [provider]  Calcium-Phosphorus-Vitamin D (CITRACAL +D3 PO) Take by mouth 2 (two) times daily.   Yes [provider]  Magnesium Citrate 100 MG TABS Take 100 mg by mouth 2 (two) times daily.   Yes [provider]  Misc Natural Products (OSTEO BI-FLEX ADV TRIPLE ST PO) Take by mouth.   Yes [provider]  Multiple Vitamin (MULTI-VITAMINS) TABS Take 1 tablet by mouth daily.   Yes [provider]  Omega-3 Fatty Acids (FISH OIL) 1000 MG  CAPS Take by mouth.   Yes [provider]  PANCREATIN PO Take 200 mg by mouth 2 (two) times daily.   Yes [provider]  QUERCETIN PO Take 500 mg by mouth 2 (two) times daily.   Yes [provider]  Saccharomyces boulardii (PROBIOTIC) 250 MG CAPS Take 1 tablet by mouth daily.   Yes [provider]  vitamin C (ASCORBIC ACID) 500 MG tablet Take 500 mg by mouth daily.   Yes [provider]  levofloxacin (LEVAQUIN) 500 MG tablet Take 1 tablet (500 mg total) by mouth daily. 09/14/17   Norval Gable, MD    Family History Family History  Problem Relation Age of Onset  . Hypertension Mother   . CAD Father     Social History Social History   Tobacco Use  . Smoking status: Former Smoker    Last attempt to quit: 04/02/1975    Years since quitting: 42.4  . Smokeless tobacco: Never Used  Substance Use Topics  . Alcohol use: No    Alcohol/week: 0.0 oz  . Drug use: No     Allergies   Amoxicillin; Cefdinir; Clarithromycin; Codeine; Hydromorphone hcl; Meperidine; Moxifloxacin; Nsaids; Omeprazole; Other; Prednisone; Pseudoephedrine hcl; Ranitidine; Aspirin; Doxycycline; and Montelukast sodium   Review of Systems Review of Systems  Respiratory: Positive for  shortness of breath.      Physical Exam Triage Vital Signs ED Triage Vitals  Enc Vitals Group     BP 09/14/17 1058 (!) 145/77     Pulse Rate 09/14/17 1058 82     Resp 09/14/17 1058 18     Temp 09/14/17 1058 97.6 F (36.4 C)     Temp Source 09/14/17 1058 Oral     SpO2 09/14/17 1058 100 %     Weight 09/14/17 1056 128 lb (58.1 kg)     Height 09/14/17 1056 5\' 8"  (1.727 m)     Head Circumference --      Peak Flow --      Pain Score 09/14/17 1057 0     Pain Loc --      Pain Edu? --      Excl. in [place]? --    No data found.  Updated Vital Signs BP (!) 145/77 (BP Location: Left Arm)   Pulse 82   Temp 97.6 F (36.4 C) (Oral)   Resp 18   Ht 5\' 8"  (1.727 m)   Wt 128 lb (58.1 kg)    SpO2 100% Comment: post and during walking  BMI 19.46 kg/m   O2 sat at rest: 100% O2 sat ambulatory: 100%   Visual Acuity Right Eye Distance:   Left Eye Distance:   Bilateral Distance:    Right Eye Near:   Left Eye Near:    Bilateral Near:     Physical Exam  Constitutional: She appears well-developed and well-nourished. No distress.  HENT:  Head: Normocephalic and atraumatic.  Right Ear: Tympanic membrane, external ear and ear canal normal.  Left Ear: Tympanic membrane, external ear and ear canal normal.  Nose: No mucosal edema, rhinorrhea, nose lacerations, sinus tenderness, nasal deformity, septal deviation or nasal septal hematoma. No epistaxis.  No foreign bodies. Right sinus exhibits no maxillary sinus tenderness and no frontal sinus tenderness. Left sinus exhibits no maxillary sinus tenderness and no frontal sinus tenderness.  Mouth/Throat: Uvula is midline, oropharynx is clear and moist and mucous membranes are normal. No oropharyngeal exudate.  Eyes: Conjunctivae and EOM are normal. Pupils are equal, round, and reactive to light. Right eye exhibits no discharge. Left eye exhibits no discharge. No scleral icterus.  Neck: Normal range of motion. Neck supple. No thyromegaly present.  Cardiovascular: Normal rate, regular rhythm and normal heart sounds.  Pulmonary/Chest: Effort normal. No stridor. No respiratory distress. She has no wheezes. She has rales (left lower lung).  Lymphadenopathy:    She has no cervical adenopathy.  Skin: She is not diaphoretic.  Psychiatric: Her mood appears anxious.  Anxious appearing  Nursing note and vitals reviewed.    UC Treatments / Results  Labs (all labs ordered are listed, but only abnormal results are displayed) Labs Reviewed - No data to display  EKG  EKG Interpretation None       Radiology Dg Chest 2 View  Result Date: 09/14/2017 CLINICAL DATA:  Severe fatigue, shortness of breath, productive cough, and fever for the  past 5 days. History of previous episodes of pneumonia. Former smoker. EXAM: CHEST  2 VIEW COMPARISON:  Thoracic spine x-ray of June 10, 2017 FINDINGS: The lungs are hyperinflated with hemidiaphragm flattening. There an infiltrate in the lingula. There is a trace of pleural fluid on the left. The right lung is clear. The heart and pulmonary vascularity are normal. There is mild multilevel degenerative disc disease of the thoracic spine. IMPRESSION: COPD. Acute lingular pneumonia. Possible  involvement of the anterior aspect of the left lower lobe as well. Followup PA and lateral chest X-ray is recommended in 3-4 weeks following trial of antibiotic therapy to ensure resolution and exclude underlying malignancy. Electronically Signed   By: David  Martinique M.D.   On: 09/14/2017 10:52    Procedures ED EKG Date/Time: 09/14/2017 12:36 PM Performed by: Norval Gable, MD Authorized by: Norval Gable, MD   ECG reviewed by ED Physician in the absence of a cardiologist: yes   Previous ECG:    Previous ECG:  Unavailable Interpretation:    Interpretation: normal   Rate:    ECG rate:  79   ECG rate assessment: normal   Rhythm:    Rhythm: sinus rhythm   Ectopy:    Ectopy: none   QRS:    QRS axis:  Normal   QRS intervals:  Normal Conduction:    Conduction: normal   ST segments:    ST segments:  Normal T waves:    T waves: normal     (including critical care time)  Medications Ordered in UC Medications  levofloxacin (LEVAQUIN) tablet 500 mg (500 mg Oral Given 09/14/17 1142)     Initial Impression / Assessment and Plan / UC Course  I have reviewed the triage vital signs and the nursing notes.  Pertinent labs & imaging results that were available during my care of the patient were reviewed by me and considered in my medical decision making (see chart for details).      Final Clinical Impressions(s) / UC Diagnoses   Final diagnoses:  Community acquired pneumonia of left lower lobe of  lung ([place])  Hyponatremia    ED Discharge Orders        Ordered    levofloxacin (LEVAQUIN) 500 MG tablet  Daily     09/14/17 1157     1. Labs/x-ray results and diagnosis reviewed with patient and husband 2. Patient given Levaquin 500mg  po x 1 in clinic 3. rx as per orders above; reviewed possible side effects, interactions, risks and benefits  3. Recommend supportive treatment with rest, fluids 4. Follow up with PCP tomorrow 5. Follow-up prn if symptoms worsen or don't improve  Controlled Substance Prescriptions [place] Controlled Substance Registry consulted? Not Applicable   Norval Gable, MD 09/14/17 1239

## 2017-09-14 NOTE — ED Triage Notes (Signed)
Patient complains of shortness of breath and feels like it hard to catch her breath. Patient has been very fatigued and has been seeing Dr. Army Melia over the last week and today for possible pneumonia. Patient was here having an x-ray done and felt like she needed to seek Emergency treatment.

## 2017-09-14 NOTE — Discharge Instructions (Signed)
Rest, fluids (oral electrolyte fluids replacement) Follow up with Primary Care Provider tomorrow

## 2017-09-14 NOTE — Progress Notes (Signed)
Date:  09/14/2017   Name:  Audrey Hayden   DOB:  Jan 02, 1951   MRN:  [id number]   Chief Complaint: Fatigue HPI Seen last week with sinusitis and started on Doxycycline which she has taken in the past with no side effects.  She began to have headache, persistent back pain and worsening fatigue over the weekend.  She stopped doxy and the HA is resolved. However, she continues to be very fatigued and she has now noticed very dark urine and is concerned about kidney and liver side effects to tylenol that she has been taking regularly. Tylenol 500 mg 4 times per day. She has also had low grade fever, continues non-productive cough and sore joints.   Review of Systems  Constitutional: Positive for fatigue. Negative for fever.  HENT: Positive for congestion, sinus pressure and tinnitus. Negative for sore throat and trouble swallowing.   Respiratory: Positive for cough. Negative for chest tightness, shortness of breath and wheezing.   Cardiovascular: Negative for chest pain and palpitations.  Gastrointestinal: Negative for abdominal pain and blood in stool.  Genitourinary: Negative for difficulty urinating, dysuria and hematuria.  Musculoskeletal: Positive for arthralgias and back pain.  Allergic/Immunologic: Negative for environmental allergies.  Neurological: Negative for dizziness.    Patient Active Problem List   Diagnosis Date Noted  . Environmental and seasonal allergies 06/19/2015  . 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 Osteoporosis 06/19/2015  . Abnormal Pap smear of cervix 06/19/2015  . Elevated HDL 06/19/2015  . History of gastritis 11/08/2014  . Cough, persistent 08/14/2014    Prior to Admission medications   Medication Sig Start Date End Date Taking? Authorizing Provider  B Complex-C (B-COMPLEX WITH VITAMIN C) tablet Take 1 tablet by mouth 2 (two) times daily.     [provider]  Calcium-Phosphorus-Vitamin D (CITRACAL +D3 PO) Take by mouth 2  (two) times daily.    [provider]  Magnesium Citrate 100 MG TABS Take 100 mg by mouth 2 (two) times daily.    [provider]  Misc Natural Products (OSTEO BI-FLEX ADV TRIPLE ST PO) Take by mouth.    [provider]  Multiple Vitamin (MULTI-VITAMINS) TABS Take 1 tablet by mouth daily.    [provider]  Omega-3 Fatty Acids (FISH OIL) 1000 MG CAPS Take by mouth.    [provider]  PANCREATIN PO Take 200 mg by mouth 2 (two) times daily.    [provider]  QUERCETIN PO Take 500 mg by mouth 2 (two) times daily.    [provider]  Saccharomyces boulardii (PROBIOTIC) 250 MG CAPS Take 1 tablet by mouth daily.    [provider]  vitamin C (ASCORBIC ACID) 500 MG tablet Take 500 mg by mouth daily.    [provider]    Allergies  Allergen Reactions  . Amoxicillin Other (See Comments)  . Cefdinir     Other reaction(s): Other (See Comments) Bright red face and ankle swelling: possible reaction to the medicine.   . Clarithromycin Other (See Comments)    Causes her to faint.  . Codeine Other (See Comments)    Causes her GI upset.  Marland Kitchen Hydromorphone Hcl Other (See Comments)  . Meperidine Other (See Comments)  . Moxifloxacin Other (See Comments)  . Nsaids     Other reaction(s): Unknown  . Omeprazole Other (See Comments)    Ears ringing with more than 20mg /day  . Other Other (See Comments)  All profins cause her to faint. All the mycins cause her to faint.  . Prednisone Other (See Comments)    Bright red face and ankle swelling: possible reaction to the medicine.  . Pseudoephedrine Hcl     Other reaction(s): Syncope  . Quinolones Other (See Comments)  . Ranitidine     Other reaction(s): Other (See Comments) SE noted with high dosage Dk urine and stools, HA  . Aspirin Hives and Rash  . Doxycycline Other (See Comments)    Headache. "Pressure on the brain''  . Montelukast Sodium Palpitations    Past  Surgical History:  Procedure Laterality Date  . BREAST BIOPSY Left    neg  . TONSILLECTOMY      Social History   Tobacco Use  . Smoking status: Former Smoker    Last attempt to quit: 04/02/1975    Years since quitting: 42.4  . Smokeless tobacco: Never Used  Substance Use Topics  . Alcohol use: No    Alcohol/week: 0.0 oz  . Drug use: No     Medication list has been reviewed and updated.  PHQ 2/9 Scores 04/20/2017 11/14/2015  PHQ - 2 Score 0 0    Physical Exam  Constitutional: She is oriented to person, place, and time. Vital signs are normal. She appears well-developed. She has a sickly appearance. No distress.  HENT:  Head: Normocephalic and atraumatic.  Nose: Right sinus exhibits maxillary sinus tenderness. Right sinus exhibits no frontal sinus tenderness. Left sinus exhibits maxillary sinus tenderness. Left sinus exhibits no frontal sinus tenderness.  Mouth/Throat: No posterior oropharyngeal edema or posterior oropharyngeal erythema.  Neck: Normal range of motion. Neck supple.  Cardiovascular: Normal rate, regular rhythm and normal heart sounds.  Pulmonary/Chest: Effort normal. No respiratory distress (bronchial BS throughout). She has no wheezes.  Abdominal: Soft. Bowel sounds are normal. She exhibits no distension and no mass. There is tenderness. There is no rebound and no guarding.  Musculoskeletal: Normal range of motion. She exhibits no edema.  Neurological: She is alert and oriented to person, place, and time.  Skin: Skin is warm and dry. No rash noted. She is not diaphoretic.  Psychiatric: She has a normal mood and affect. Her behavior is normal. Thought content normal.  Nursing note and vitals reviewed.   BP 120/76   Pulse 93   Temp 97.6 F (36.4 C) (Oral)   Ht 5\' 8"  (1.727 m)   Wt 128 lb (58.1 kg)   SpO2 100%   BMI 19.46 kg/m   Assessment and Plan: 1. Acute maxillary sinusitis, recurrence not specified Remain off doxycycline - CBC with  Differential/Platelet  2. Fatigue, unspecified type - Comprehensive metabolic panel - POC urinalysis w microscopic (non auto)  3. Cough Concern for CAP Continue fluids and rest - DG Chest 2 View; Future   No orders of the defined types were placed in this encounter.   Partially dictated using Editor, commissioning. Any errors are unintentional.  Halina Maidens, MD [facility]  09/14/2017

## 2017-09-17 ENCOUNTER — Telehealth: Payer: Self-pay | Admitting: Emergency Medicine

## 2017-09-17 NOTE — Telephone Encounter (Signed)
Called to follow up after patient's recent visit. Left message to call with any concerns or questions.

## 2017-09-18 ENCOUNTER — Telehealth: Payer: Self-pay | Admitting: *Deleted

## 2017-09-18 NOTE — Telephone Encounter (Signed)
Patient called requesting information on whether she can take OTC Nexium while taking Levaquin that was prescribed to her by Dr Zenda Alpers on 09/14/17. Patient did report some resolution of symptoms since her visit. After conferring with a provider about taking Nexium with Levaquin it was determined that there is no known contraindication to taking the two medications together. I informed the patient that it would be ok to take OTC Nexium with the Levaquin. Patient confirmed understanding of information.

## 2017-09-25 ENCOUNTER — Other Ambulatory Visit: Payer: Self-pay

## 2017-09-25 ENCOUNTER — Telehealth: Payer: Self-pay

## 2017-09-25 DIAGNOSIS — J189 Pneumonia, unspecified organism: Secondary | ICD-10-CM

## 2017-09-25 NOTE — Telephone Encounter (Signed)
Patient called about getting pneumonia shot. Informed her since she just had pneumonia she will need a chest Xray in 4 weeks. Ordered the Xray and informed her to have this done and just walk in downstairs at imaging in a month. If the chest xray come back clear then we will schedule her for her first pneumovax shot in one month from xray.   She verbalized understanding.

## 2017-10-09 ENCOUNTER — Ambulatory Visit
Admission: RE | Admit: 2017-10-09 | Discharge: 2017-10-09 | Disposition: A | Discharge status: Home / Self Care | Payer: Medicare Other | Source: Ambulatory Visit | Attending: Neurosurgery | Admitting: Neurosurgery

## 2017-10-09 DIAGNOSIS — S299XXA Unspecified injury of thorax, initial encounter: Secondary | ICD-10-CM | POA: Diagnosis not present

## 2017-10-09 DIAGNOSIS — M5134 Other intervertebral disc degeneration, thoracic region: Secondary | ICD-10-CM | POA: Insufficient documentation

## 2017-10-09 DIAGNOSIS — M899 Disorder of bone, unspecified: Secondary | ICD-10-CM | POA: Diagnosis not present

## 2017-10-09 DIAGNOSIS — M4854XA Collapsed vertebra, not elsewhere classified, thoracic region, initial encounter for fracture: Secondary | ICD-10-CM | POA: Diagnosis not present

## 2017-10-09 DIAGNOSIS — M546 Pain in thoracic spine: Secondary | ICD-10-CM | POA: Diagnosis not present

## 2017-10-14 ENCOUNTER — Other Ambulatory Visit: Payer: Self-pay

## 2017-10-14 DIAGNOSIS — R5383 Other fatigue: Secondary | ICD-10-CM

## 2017-10-15 DIAGNOSIS — M4850XA Collapsed vertebra, not elsewhere classified, site unspecified, initial encounter for fracture: Secondary | ICD-10-CM | POA: Diagnosis not present

## 2017-10-15 DIAGNOSIS — S22060A Wedge compression fracture of T7-T8 vertebra, initial encounter for closed fracture: Secondary | ICD-10-CM | POA: Diagnosis not present

## 2017-10-15 DIAGNOSIS — M899 Disorder of bone, unspecified: Secondary | ICD-10-CM | POA: Diagnosis not present

## 2017-10-15 LAB — TSH: TSH: 2.44 u[IU]/mL (ref 0.450–4.500)

## 2017-10-23 ENCOUNTER — Ambulatory Visit
Admission: RE | Admit: 2017-10-23 | Discharge: 2017-10-23 | Disposition: A | Discharge status: Home / Self Care | Payer: Medicare Other | Source: Ambulatory Visit | Attending: Internal Medicine | Admitting: Internal Medicine

## 2017-10-23 DIAGNOSIS — J189 Pneumonia, unspecified organism: Secondary | ICD-10-CM | POA: Insufficient documentation

## 2017-11-04 ENCOUNTER — Encounter: Payer: Self-pay | Admitting: Internal Medicine

## 2017-11-04 ENCOUNTER — Ambulatory Visit (INDEPENDENT_AMBULATORY_CARE_PROVIDER_SITE_OTHER): Payer: Medicare Other | Admitting: Internal Medicine

## 2017-11-04 VITALS — BP 124/64 | HR 89 | Ht 68.0 in | Wt 121.2 lb

## 2017-11-04 DIAGNOSIS — R634 Abnormal weight loss: Secondary | ICD-10-CM | POA: Diagnosis not present

## 2017-11-04 NOTE — Progress Notes (Signed)
Date:  11/04/2017   Name:  Audrey Hayden   DOB:  02-Feb-1951   MRN:  [id number]   Chief Complaint: Weight Loss (Noticed weight loss about 2 weeks ago. Concerned of this. Hungry every 2 hours but still losing weight. having anxiety issues with concern of weight loss. Also, having issues with being too cold, and then too hot. GAD7- 50 )  Lab Results  Component Value Date   TSH 2.440 10/14/2017   She has been more anxious since her vertebral fracture and pneumonia.  She also has noticed that her appetite has been up and down.   She added Mag and Lysine with some improvement in her anxiety. She has been exercising more.  Her diet is limited - she describes herself as very discriminating- no dairy, very little fat.  She has not tried to increase her protein or fat intake. She does not want to take medication for anxiety but is considering counseling.   Review of Systems  Constitutional: Positive for unexpected weight change. Negative for chills, fatigue and fever.  Respiratory: Negative for cough, chest tightness, shortness of breath and wheezing.   Cardiovascular: Negative for chest pain, palpitations and leg swelling.  Gastrointestinal: Negative for nausea and vomiting.  Genitourinary: Negative for dysuria.  Musculoskeletal: Positive for back pain.  Skin: Negative for color change and rash.  Psychiatric/Behavioral: Negative for sleep disturbance. The patient is nervous/anxious.     Patient Active Problem List   Diagnosis Date Noted  . Environmental and seasonal allergies 06/19/2015  . 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 Osteoporosis 06/19/2015  . Abnormal Pap smear of cervix 06/19/2015  . Elevated HDL 06/19/2015  . History of gastritis 11/08/2014  . Cough, persistent 08/14/2014    Prior to Admission medications   Medication Sig Start Date End Date Taking? Authorizing Provider  B Complex-C (B-COMPLEX WITH VITAMIN C) tablet Take 1 tablet by mouth 2 (two)  times daily.    Yes [provider]  Calcium-Phosphorus-Vitamin D (CITRACAL +D3 PO) Take by mouth 2 (two) times daily.   Yes [provider]  Magnesium Citrate 100 MG TABS Take 250 mg by mouth daily.    Yes [provider]  PANCREATIN PO Take 200 mg by mouth 2 (two) times daily.   Yes [provider]  QUERCETIN PO Take 500 mg by mouth 2 (two) times daily.   Yes [provider]  Saccharomyces boulardii (PROBIOTIC) 250 MG CAPS Take 1 tablet by mouth daily.   Yes [provider]    Allergies  Allergen Reactions  . Amoxicillin Other (See Comments)  . Cefdinir     Other reaction(s): Other (See Comments) Bright red face and ankle swelling: possible reaction to the medicine.   . Clarithromycin Other (See Comments)    Causes her to faint.  . Codeine Other (See Comments)    Causes her GI upset.  Marland Kitchen Hydromorphone Hcl Other (See Comments)  . Meperidine Other (See Comments)  . Moxifloxacin Other (See Comments)    Dizziness   . Nsaids     Other reaction(s): Unknown  . Omeprazole Other (See Comments)    Ears ringing with more than 20mg /day  . Other Other (See Comments)    All profins cause her to faint. All the mycins cause her to faint.  . Prednisone Other (See Comments)    Bright red face and ankle swelling: possible reaction to the medicine.  . Pseudoephedrine Hcl     Other  reaction(s): Syncope  . Ranitidine     Other reaction(s): Other (See Comments) SE noted with high dosage Dk urine and stools, HA  . Aspirin Hives and Rash  . Doxycycline Other (See Comments)    Headache. "Pressure on the brain''  . Montelukast Sodium Palpitations    Past Surgical History:  Procedure Laterality Date  . BREAST BIOPSY Left    neg  . TONSILLECTOMY      Social History   Tobacco Use  . Smoking status: Former Smoker    Last attempt to quit: 04/02/1975    Years since quitting: 42.6  . Smokeless tobacco: Never Used  Substance Use Topics  .  Alcohol use: No    Alcohol/week: 0.0 oz  . Drug use: No     Medication list has been reviewed and updated.  PHQ 2/9 Scores 04/20/2017 11/14/2015  PHQ - 2 Score 0 0    Physical Exam  Constitutional: She is oriented to person, place, and time. She appears well-developed. No distress.  HENT:  Head: Normocephalic and atraumatic.  Neck: Normal range of motion. Neck supple. No thyromegaly present.  Cardiovascular: Normal rate, regular rhythm and normal heart sounds.  Pulmonary/Chest: Effort normal and breath sounds normal. No respiratory distress. She has no wheezes. She has no rales.  Abdominal: Soft. Bowel sounds are normal.  Musculoskeletal: Normal range of motion. She exhibits no edema or tenderness.  Neurological: She is alert and oriented to person, place, and time.  Skin: Skin is warm and dry. No rash noted.  Psychiatric: She has a normal mood and affect. Her behavior is normal. Thought content normal.  Nursing note and vitals reviewed.  Wt Readings from Last 3 Encounters:  11/04/17 121 lb 3.2 oz (55 kg)  09/14/17 128 lb (58.1 kg)  09/14/17 128 lb (58.1 kg)    BP 124/64   Pulse 89   Ht 5\' 8"  (1.727 m)   Wt 121 lb 3.2 oz (55 kg)   SpO2 100%   BMI 18.43 kg/m   Assessment and Plan: 1. Weight loss, unintentional Pt is counseled on high protein supplements and foods Eat three meals and three snacks per day Resume Lysine as this helped with anxiety Consider counseling for other issues contributing to anxiety Recheck in one month   No orders of the defined types were placed in this encounter.   Partially dictated using Editor, commissioning. Any errors are unintentional.  Halina Maidens, MD [facility]  11/04/2017

## 2017-11-04 NOTE — Patient Instructions (Signed)
Resume the Lysine supplements

## 2017-11-09 ENCOUNTER — Ambulatory Visit (INDEPENDENT_AMBULATORY_CARE_PROVIDER_SITE_OTHER): Payer: Medicare Other

## 2017-11-09 DIAGNOSIS — Z23 Encounter for immunization: Secondary | ICD-10-CM | POA: Diagnosis not present

## 2017-11-13 ENCOUNTER — Ambulatory Visit
Admission: EM | Admit: 2017-11-13 | Discharge: 2017-11-13 | Disposition: A | Discharge status: Home / Self Care | Payer: Medicare Other | Attending: Family Medicine | Admitting: Family Medicine

## 2017-11-13 ENCOUNTER — Other Ambulatory Visit: Payer: Self-pay

## 2017-11-13 DIAGNOSIS — F419 Anxiety disorder, unspecified: Secondary | ICD-10-CM | POA: Diagnosis not present

## 2017-11-13 MED ORDER — QUETIAPINE FUMARATE 50 MG PO TABS: 50.0000 mg | ORAL_TABLET | Freq: Every day | ORAL | 0 refills | Status: DC

## 2017-11-13 NOTE — ED Triage Notes (Signed)
Patient complains of anxiety. Patient states that she is finding herself having to eat every 2 hours because she is anxious about losing weight. Patient has been not able to sleep. Patient states that has been getting up every 2 hours at night to eat. Patient states that she breaks in to a sweat if she doesn't ear every 2 hours.

## 2017-11-13 NOTE — ED Provider Notes (Signed)
[facility]  CSN: [id number] Arrival date & time: 11/13/17  1304  History   Chief Complaint Chief Complaint  Patient presents with  . Anxiety   HPI  67 year old female presents with anxiety, weight loss.  Patient states that this has been going on for the past 2 months.  She has recently seen her primary for this.  Patient reports that she is anxious.  She states that she is very concerned about weight loss.  She states that she has went from 128-119.  She states that she feels overwhelmed.  She is eating every 2 hours.  She is even waking up every 2 hours to eat at night.  She states that if she does not eat every 2 hours, she feels weak and gets hot and sweaty.  She feels like her sugar drops.  Her husband states that she is very labile.  Her moods are up and down.  She is quite obsessive about her weight loss and eating/preparing meals.  Her husband states that she has a history of bipolar disorder which was treated with Depakote.  She is not on any current medication for this.  She has not seen a psychiatrist in years.  Patient is very anxious.  She states that she is very overwhelmed.  No reports of fever.  No reports of abdominal pain.  No other complaints or concerns at this time.  Past Medical History:  Diagnosis Date  . Allergy   . Osteoporosis    Patient Active Problem List   Diagnosis Date Noted  . Environmental and seasonal allergies 06/19/2015  . 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 Osteoporosis 06/19/2015  . Abnormal Pap smear of cervix 06/19/2015  . Elevated HDL 06/19/2015  . History of gastritis 11/08/2014  . Cough, persistent 08/14/2014    Past Surgical History:  Procedure Laterality Date  . BREAST BIOPSY Left    neg  . TONSILLECTOMY      OB History    No data available       Home Medications    Prior to Admission medications   Medication Sig Start Date End Date Taking? Authorizing Provider  B Complex-C (B-COMPLEX WITH  VITAMIN C) tablet Take 1 tablet by mouth 2 (two) times daily.    Yes [provider]  Calcium-Phosphorus-Vitamin D (CITRACAL +D3 PO) Take by mouth 2 (two) times daily.   Yes [provider]  Magnesium Citrate 100 MG TABS Take 250 mg by mouth daily.    Yes [provider]  PANCREATIN PO Take 200 mg by mouth 2 (two) times daily.   Yes [provider]  QUERCETIN PO Take 500 mg by mouth 2 (two) times daily.   Yes [provider]  Saccharomyces boulardii (PROBIOTIC) 250 MG CAPS Take 1 tablet by mouth daily.   Yes [provider]  QUEtiapine (SEROQUEL) 50 MG tablet Take 1 tablet (50 mg total) by mouth at bedtime. 11/13/17   Coral Spikes, DO    Family History Family History  Problem Relation Age of Onset  . Hypertension Mother   . CAD Father     Social History Social History   Tobacco Use  . Smoking status: Former Smoker    Last attempt to quit: 04/02/1975    Years since quitting: 42.6  . Smokeless tobacco: Never Used  Substance Use Topics  . Alcohol use: No    Alcohol/week: 0.0 oz  . Drug use: No     Allergies  Amoxicillin; Cefdinir; Clarithromycin; Codeine; Hydromorphone hcl; Meperidine; Moxifloxacin; Nsaids; Omeprazole; Other; Prednisone; Pseudoephedrine hcl; Ranitidine; Aspirin; Doxycycline; and Montelukast sodium   Review of Systems Review of Systems  Constitutional: Positive for appetite change and unexpected weight change.  Psychiatric/Behavioral: Positive for sleep disturbance. The patient is nervous/anxious.    Physical Exam Triage Vital Signs ED Triage Vitals  Enc Vitals Group     BP 11/13/17 1332 128/66     Pulse Rate 11/13/17 1332 86     Resp 11/13/17 1332 18     Temp 11/13/17 1332 98.2 F (36.8 C)     Temp Source 11/13/17 1332 Oral     SpO2 11/13/17 1332 100 %     Weight 11/13/17 1328 119 lb (54 kg)     Height 11/13/17 1328 5\' 8"  (1.727 m)     Head Circumference --      Peak Flow --      Pain Score  11/13/17 1327 0     Pain Loc --      Pain Edu? --      Excl. in [place]? --    Updated Vital Signs BP 128/66 (BP Location: Left Arm)   Pulse 86   Temp 98.2 F (36.8 C) (Oral)   Resp 18   Ht 5\' 8"  (1.727 m)   Wt 119 lb (54 kg)   SpO2 100%   BMI 18.09 kg/m     Physical Exam  Constitutional: She is oriented to person, place, and time. She appears well-developed. No distress.  HENT:  Head: Normocephalic and atraumatic.  Cardiovascular: Normal rate and regular rhythm.  Pulmonary/Chest: Effort normal and breath sounds normal.  Neurological: She is alert and oriented to person, place, and time.  Psychiatric:  Very anxious and perseverative.  Her behavior here is normal.  Her behavior at home is quite odd.  Nursing note and vitals reviewed.  UC Treatments / Results  Labs (all labs ordered are listed, but only abnormal results are displayed) Labs Reviewed - No data to display  EKG  EKG Interpretation None       Radiology No results found.  Procedures Procedures (including critical care time)  Medications Ordered in UC Medications - No data to display   Initial Impression / Assessment and Plan / UC Course  I have reviewed the triage vital signs and the nursing notes.  Pertinent labs & imaging results that were available during my care of the patient were reviewed by me and considered in my medical decision making (see chart for details).     67 year old female presents with weight loss, anxiety.  Patient appears to have obsessive-compulsive thoughts/fixation regarding weight loss and the need to eat every 2 hours.  Given her history of bipolar disorder, she may be in a slightly manic state.  We had a lengthy discussion about the lack of evidence that she needs to eat every 2 hours.  I am placing her on Seroquel.  I have advised her to follow-up with her primary care physician as well as psychiatry.  Final Clinical Impressions(s) / UC Diagnoses   Final diagnoses:    Anxiety    ED Discharge Orders        Ordered    QUEtiapine (SEROQUEL) 50 MG tablet  Daily at bedtime     11/13/17 1405     Controlled Substance Prescriptions [place] Controlled Substance Registry consulted? Not Applicable   Coral Spikes, DO 11/13/17 1425

## 2017-11-13 NOTE — Discharge Instructions (Signed)
Medication as prescribed.  Take care  Dr. Goldie Dimmer  

## 2017-11-19 ENCOUNTER — Other Ambulatory Visit: Payer: Self-pay | Admitting: Internal Medicine

## 2017-11-19 DIAGNOSIS — R634 Abnormal weight loss: Secondary | ICD-10-CM

## 2017-11-19 DIAGNOSIS — D539 Nutritional anemia, unspecified: Secondary | ICD-10-CM

## 2017-11-20 LAB — VITAMIN B12: Vitamin B-12: 1302 pg/mL — ABNORMAL HIGH (ref 232–1245)

## 2017-11-20 LAB — THYROID PANEL WITH TSH
Free Thyroxine Index: 1.6 (ref 1.2–4.9)
T3 UPTAKE RATIO: 25 % (ref 24–39)
T4, Total: 6.3 ug/dL (ref 4.5–12.0)
TSH: 2.13 u[IU]/mL (ref 0.450–4.500)

## 2017-12-09 ENCOUNTER — Encounter: Payer: Self-pay | Admitting: Internal Medicine

## 2017-12-09 ENCOUNTER — Ambulatory Visit (INDEPENDENT_AMBULATORY_CARE_PROVIDER_SITE_OTHER): Payer: Medicare Other | Admitting: Internal Medicine

## 2017-12-09 VITALS — BP 122/70 | HR 71 | Ht 68.0 in | Wt 120.6 lb

## 2017-12-09 DIAGNOSIS — G8929 Other chronic pain: Secondary | ICD-10-CM | POA: Diagnosis not present

## 2017-12-09 DIAGNOSIS — F419 Anxiety disorder, unspecified: Secondary | ICD-10-CM | POA: Diagnosis not present

## 2017-12-09 DIAGNOSIS — M546 Pain in thoracic spine: Secondary | ICD-10-CM | POA: Diagnosis not present

## 2017-12-09 MED ORDER — QUETIAPINE FUMARATE 25 MG PO TABS: 25.0000 mg | ORAL_TABLET | Freq: Every day | ORAL | 1 refills | Status: DC

## 2017-12-09 NOTE — Progress Notes (Signed)
Date:  12/09/2017   Name:  Audrey Hayden   DOB:  07-Jun-1951   MRN:  [id number]   Chief Complaint: Anxiety (States feeling better. Working on gaining weight. Walking everyday- working on diet.) and Back Pain (Hurt back in september. Feels like she is experiencing some inflammation in back. )  Anxiety  Presents for follow-up visit. Symptoms include excessive worry and nervous/anxious behavior. Patient reports no chest pain, dizziness, nausea, palpitations, shortness of breath or suicidal ideas. Symptoms occur most days. The severity of symptoms is moderate.   Compliance with medications: started taking seroquel from UC but got dizzy so she cut the dose in half (seroquel 25 mg)  Back Pain  This is a chronic problem. The current episode started more than 1 month ago (6 months ago had pain near right shoulder blade after picking up a heavy box). The problem occurs intermittently. The problem has been waxing and waning since onset. The pain is present in the thoracic spine. The quality of the pain is described as aching. The pain does not radiate. The pain is mild. The symptoms are aggravated by twisting. Pertinent negatives include no abdominal pain, chest pain, fever, numbness or weakness. Risk factors: hx of T8 compression fracture.   MRI Thoracic spine 10/2017: IMPRESSION: Unchanged appearance of the thoracic spine.  No new abnormality.  Mild, mild, remote T8 compression fracture.  Lesion in the right aspect of the T12 vertebral body and pedicle is most compatible with an atypical hemangioma and unchanged.  Mild degenerative disc disease without central canal or foraminal stenosis.  Wt Readings from Last 3 Encounters:  12/09/17 120 lb 9.6 oz (54.7 kg)  11/13/17 119 lb (54 kg)  11/04/17 121 lb 3.2 oz (55 kg)     Review of Systems  Constitutional: Positive for appetite change (improved after stopping multiple supplements). Negative for chills, fatigue and fever.    Respiratory: Negative for chest tightness and shortness of breath.   Cardiovascular: Negative for chest pain and palpitations.  Gastrointestinal: Negative for abdominal pain, constipation, diarrhea, nausea and vomiting.  Musculoskeletal: Positive for back pain. Negative for neck stiffness.  Neurological: Negative for dizziness, tremors, weakness and numbness.  Hematological: Negative for adenopathy.  Psychiatric/Behavioral: Positive for dysphoric mood. Negative for self-injury, sleep disturbance and suicidal ideas. The patient is nervous/anxious.     Patient Active Problem List   Diagnosis Date Noted  . Environmental and seasonal allergies 06/19/2015  . 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 Osteoporosis 06/19/2015  . Abnormal Pap smear of cervix 06/19/2015  . Elevated HDL 06/19/2015  . History of gastritis 11/08/2014  . Cough, persistent 08/14/2014    Prior to Admission medications   Medication Sig Start Date End Date Taking? Authorizing Provider  PANCREATIN PO Take 200 mg by mouth 2 (two) times daily.   Yes [provider]  QUEtiapine (SEROQUEL) 50 MG tablet Take 1 tablet (50 mg total) by mouth at bedtime. Patient taking differently: Take 25 mg by mouth at bedtime.  11/13/17  Yes Coral Spikes, DO    Allergies  Allergen Reactions  . Amoxicillin Other (See Comments)  . Cefdinir     Other reaction(s): Other (See Comments) Bright red face and ankle swelling: possible reaction to the medicine.   . Clarithromycin Other (See Comments)    Causes her to faint.  . Codeine Other (See Comments)    Causes her GI upset.  Marland Kitchen Hydromorphone Hcl Other (See Comments)  . Meperidine Other (  See Comments)  . Moxifloxacin Other (See Comments)    Dizziness   . Nsaids     Other reaction(s): Unknown  . Omeprazole Other (See Comments)    Ears ringing with more than 20mg /day  . Other Other (See Comments)    All profins cause her to faint. All the mycins cause her to faint.   . Prednisone Other (See Comments)    Bright red face and ankle swelling: possible reaction to the medicine.  . Pseudoephedrine Hcl     Other reaction(s): Syncope  . Ranitidine     Other reaction(s): Other (See Comments) SE noted with high dosage Dk urine and stools, HA  . Aspirin Hives and Rash  . Doxycycline Other (See Comments)    Headache. "Pressure on the brain''  . Montelukast Sodium Palpitations    Past Surgical History:  Procedure Laterality Date  . BREAST BIOPSY Left    neg  . TONSILLECTOMY      Social History   Tobacco Use  . Smoking status: Former Smoker    Last attempt to quit: 04/02/1975    Years since quitting: 42.7  . Smokeless tobacco: Never Used  Substance Use Topics  . Alcohol use: No    Alcohol/week: 0.0 oz  . Drug use: No     Medication list has been reviewed and updated.  PHQ 2/9 Scores 04/20/2017 11/14/2015  PHQ - 2 Score 0 0    Physical Exam  Constitutional: She is oriented to person, place, and time. She appears well-developed. No distress.  HENT:  Head: Normocephalic and atraumatic.  Neck: Normal range of motion. Neck supple.  Cardiovascular: Normal rate, regular rhythm and normal heart sounds.  Pulmonary/Chest: Effort normal and breath sounds normal. No respiratory distress. She has no wheezes.  Musculoskeletal: Normal range of motion.       Arms: Neurological: She is alert and oriented to person, place, and time. She has normal strength and normal reflexes.  Skin: Skin is warm and dry. No rash noted.  Psychiatric: She has a normal mood and affect. Her speech is normal and behavior is normal. Thought content normal. Cognition and memory are normal.  Nursing note and vitals reviewed.   BP 122/70   Pulse 71   Ht 5\' 8"  (1.727 m)   Wt 120 lb 9.6 oz (54.7 kg)   SpO2 100%   BMI 18.34 kg/m   Assessment and Plan: 1. Anxiety Improved with seroquel Recommend Psych evaluation and counseling Weight loss has halted - continue to pursue  healthy diet - Ambulatory referral to Psychiatry - QUEtiapine (SEROQUEL) 25 MG tablet; Take 1 tablet (25 mg total) by mouth at bedtime.  Dispense: 30 tablet; Refill: 1  2. Chronic left-sided thoracic back pain Continue heat or ice and tylenol Reassured by MRI of thoracic spine 2/219    Meds ordered this encounter  Medications  . QUEtiapine (SEROQUEL) 25 MG tablet    Sig: Take 1 tablet (25 mg total) by mouth at bedtime.    Dispense:  30 tablet    Refill:  1    Partially dictated using Editor, commissioning. Any errors are unintentional.  Halina Maidens, MD [facility]  12/09/2017

## 2018-01-04 ENCOUNTER — Telehealth: Payer: Self-pay

## 2018-01-04 NOTE — Telephone Encounter (Signed)
Patient requesting to taper off of Seroquel due to possible neuropathy and Restless Leg. Needs schedule for Taper.

## 2018-01-04 NOTE — Telephone Encounter (Signed)
She can taper and discontinue to Seroquel as follows: 1/2 tablet daily for one week then 1/2 tablet every other day for one week then stop. She should have heard from Dr. Nicolasa Ducking (psych) - please keep that appointment.

## 2018-01-11 ENCOUNTER — Ambulatory Visit (INDEPENDENT_AMBULATORY_CARE_PROVIDER_SITE_OTHER): Payer: Medicare Other | Admitting: Internal Medicine

## 2018-01-11 ENCOUNTER — Encounter: Payer: Self-pay | Admitting: Internal Medicine

## 2018-01-11 VITALS — BP 132/71 | HR 79 | Resp 16 | Ht 68.0 in | Wt 120.3 lb

## 2018-01-11 DIAGNOSIS — E162 Hypoglycemia, unspecified: Secondary | ICD-10-CM

## 2018-01-11 DIAGNOSIS — F419 Anxiety disorder, unspecified: Secondary | ICD-10-CM

## 2018-01-11 DIAGNOSIS — R636 Underweight: Secondary | ICD-10-CM | POA: Diagnosis not present

## 2018-01-11 DIAGNOSIS — E079 Disorder of thyroid, unspecified: Secondary | ICD-10-CM

## 2018-01-11 NOTE — Patient Instructions (Addendum)
Get the store brand glucometer and supplies and check BS whenever you feel the symptoms coming on.   You might also check it when you feel well to see what the baseline is.

## 2018-01-11 NOTE — Progress Notes (Signed)
Date:  01/11/2018   Name:  Audrey Hayden   DOB:  06/05/1951   MRN:  [id number]   Chief Complaint: Weight Check (feels she has hyperthyroidism with lack of gaining weight. Is adding protein and more meats every 2 hours to try to gain weight. Protein adding energy to her day but after chores and walking she is exhausted and eats agina. ) and Hypoglycemia (feels her blood sugar is dropping fast now and then and could be related to medications. Also faints on aspirin and thinks this is salicytic acid issue and drops Blood Sugar if eats pears. )  Underweight - pt had normal thyroid function tests.  Weight is unchanged despite eating regularly.  Pt also concerned about possible low blood sugar.  She has not been able to check her BS as she has no meter. If she eats anything sweet or fruit, she breaks out in a sweat, feels nauseated and almost faint.  She has started taking Cromium which has helped. She read that Levaquin can raise BS - she took this in January.  She is eating complete healthy meals and is very frustrated that she has not gained.  She feels that she really has thyroid disease and would like to be referred to Endocrinology.  Her latest thyroid panel was normal but she feels that there is still the possibility of a problem.  Anxiety - she is tapering off Seroquel.  It did not help her anxiety and she thinks it may be making her other sx worse.  She does have an appt with Psych on 02/18/18.   Review of Systems  Constitutional: Negative for chills, fatigue, fever and unexpected weight change.  Respiratory: Negative for cough, chest tightness, shortness of breath and wheezing.   Cardiovascular: Negative for chest pain and palpitations.  Gastrointestinal: Negative for abdominal pain, blood in stool and constipation.  Musculoskeletal: Negative for arthralgias.  Skin: Negative for color change and rash.  Neurological: Positive for weakness and light-headedness (after eating sweets -  thinks it is low blood sugar). Negative for dizziness, tremors and headaches.  Psychiatric/Behavioral: Negative for sleep disturbance.    Patient Active Problem List   Diagnosis Date Noted  . Chronic left-sided thoracic back pain 12/09/2017  . Anxiety 12/09/2017  . Environmental and seasonal allergies 06/19/2015  . 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 Osteoporosis 06/19/2015  . Abnormal Pap smear of cervix 06/19/2015  . Elevated HDL 06/19/2015  . History of gastritis 11/08/2014  . Cough, persistent 08/14/2014    Prior to Admission medications   Medication Sig Start Date End Date Taking? Authorizing Provider  Chromium 200 MCG TABS Take 1 tablet by mouth.   Yes [provider]  PANCREATIN PO Take 200 mg by mouth 3 (three) times daily.    Yes [provider]  QUEtiapine (SEROQUEL) 25 MG tablet Take 1 tablet (25 mg total) by mouth at bedtime. Patient taking differently: Take 25 mg by mouth at bedtime. Tapering off - 12/09/17  Yes Glean Hess, MD    Allergies  Allergen Reactions  . Amoxicillin Other (See Comments)  . Cefdinir     Other reaction(s): Other (See Comments) Bright red face and ankle swelling: possible reaction to the medicine.   . Clarithromycin Other (See Comments)    Causes her to faint.  . Codeine Other (See Comments)    Causes her GI upset.  Marland Kitchen Hydromorphone Hcl Other (See Comments)  . Meperidine Other (See Comments)  .  Moxifloxacin Other (See Comments)    Dizziness   . Nsaids     Other reaction(s): Unknown  . Omeprazole Other (See Comments)    Ears ringing with more than 20mg /day  . Other Other (See Comments)    All profins cause her to faint. All the mycins cause her to faint.  . Prednisone Other (See Comments)    Bright red face and ankle swelling: possible reaction to the medicine.  . Pseudoephedrine Hcl     Other reaction(s): Syncope  . Ranitidine     Other reaction(s): Other (See Comments) SE noted with high  dosage Dk urine and stools, HA  . Salicylates   . Aspirin Hives and Rash  . Doxycycline Other (See Comments)    Headache. "Pressure on the brain''  . Montelukast Sodium Palpitations    Past Surgical History:  Procedure Laterality Date  . BREAST BIOPSY Left    neg  . TONSILLECTOMY      Social History   Tobacco Use  . Smoking status: Former Smoker    Last attempt to quit: 04/02/1975    Years since quitting: 42.8  . Smokeless tobacco: Never Used  Substance Use Topics  . Alcohol use: No    Alcohol/week: 0.0 oz  . Drug use: No     Medication list has been reviewed and updated.  PHQ 2/9 Scores 04/20/2017 11/14/2015  PHQ - 2 Score 0 0   Wt Readings from Last 3 Encounters:  01/11/18 120 lb 4.8 oz (54.6 kg)  12/09/17 120 lb 9.6 oz (54.7 kg)  11/13/17 119 lb (54 kg)    Physical Exam  Constitutional: She is oriented to person, place, and time. She appears well-developed. No distress.  HENT:  Head: Normocephalic and atraumatic.  Neck: Normal range of motion. Neck supple.  Cardiovascular: Normal rate, regular rhythm and normal heart sounds.  Pulmonary/Chest: Effort normal and breath sounds normal. No respiratory distress.  Abdominal: Soft. Bowel sounds are normal. She exhibits no mass. There is no tenderness. There is no guarding.  Musculoskeletal: Normal range of motion.  Neurological: She is alert and oriented to person, place, and time.  Skin: Skin is warm and dry. No rash noted.  Psychiatric: Her behavior is normal. Thought content normal. Her mood appears anxious. Her affect is not angry and not inappropriate.  Nursing note and vitals reviewed.   BP 132/71   Pulse 79   Resp 16   Ht 5\' 8"  (1.727 m)   Wt 120 lb 4.8 oz (54.6 kg)   SpO2 99%   BMI 18.29 kg/m   Assessment and Plan: 1. Underweight Possible low blood sugar disorder vs other pathology - Ambulatory referral to Endocrinology  2. Hypoglycemic disorder Encourage pt to check BS when sx occur and take  readings to Endo appt - Ambulatory referral to Endocrinology  3. Anxiety Taper off seroquel; see Psych as planned  4. Thyroid dysfunction Normal thyroid panel but pt wants specialist opinion - Ambulatory referral to Endocrinology   No orders of the defined types were placed in this encounter.   Partially dictated using Editor, commissioning. Any errors are unintentional.  Halina Maidens, MD [facility]  01/11/2018

## 2018-01-18 ENCOUNTER — Ambulatory Visit: Payer: Medicare Other | Admitting: Internal Medicine

## 2018-02-01 ENCOUNTER — Telehealth: Payer: Self-pay

## 2018-02-01 NOTE — Telephone Encounter (Signed)
Patient called stating she is no longer seeing Dr. Nicolasa Ducking. She had an OV to see him on 02/18/2018 but cx appt. Patient state she is currently seeing a counselor who she has been seeing for 2 weeks and will manage with them.

## 2018-02-05 ENCOUNTER — Ambulatory Visit
Admission: RE | Admit: 2018-02-05 | Payer: Medicare Other | Source: Ambulatory Visit | Admitting: Unknown Physician Specialty

## 2018-02-05 ENCOUNTER — Encounter: Admission: RE | Payer: Self-pay | Source: Ambulatory Visit

## 2018-02-05 SURGERY — COLONOSCOPY WITH PROPOFOL
Anesthesia: General

## 2018-04-14 DIAGNOSIS — Z87898 Personal history of other specified conditions: Secondary | ICD-10-CM | POA: Diagnosis not present

## 2018-04-14 DIAGNOSIS — E162 Hypoglycemia, unspecified: Secondary | ICD-10-CM | POA: Diagnosis not present

## 2018-04-26 ENCOUNTER — Ambulatory Visit (INDEPENDENT_AMBULATORY_CARE_PROVIDER_SITE_OTHER): Payer: Medicare Other

## 2018-04-26 VITALS — BP 110/60 | HR 70 | Temp 98.3°F | Resp 12 | Ht 68.0 in | Wt 131.8 lb

## 2018-04-26 DIAGNOSIS — Z0000 Encounter for general adult medical examination without abnormal findings: Secondary | ICD-10-CM | POA: Diagnosis not present

## 2018-04-26 DIAGNOSIS — Z1231 Encounter for screening mammogram for malignant neoplasm of breast: Secondary | ICD-10-CM | POA: Diagnosis not present

## 2018-04-26 DIAGNOSIS — Z1239 Encounter for other screening for malignant neoplasm of breast: Secondary | ICD-10-CM

## 2018-04-26 NOTE — Progress Notes (Signed)
Subjective:   Audrey Hayden is a 67 y.o. female who presents for Medicare Annual (Subsequent) preventive examination.  Review of Systems:  N/A Cardiac Risk Factors include: advanced age (>24men, >77 women);sedentary lifestyle     Objective:     Vitals: BP 110/60 (BP Location: Right Arm, Patient Position: Sitting, Cuff Size: Normal)   Pulse 70   Temp 98.3 F (36.8 C) (Oral)   Resp 12   Ht 5\' 8"  (1.727 m)   Wt 131 lb 12.8 oz (59.8 kg)   SpO2 98%   BMI 20.04 kg/m   Body mass index is 20.04 kg/m.  Advanced Directives 04/26/2018 11/13/2017 09/14/2017 04/20/2017 11/14/2015  Does Patient Have a Medical Advance Directive? No Yes No Yes Yes  Type of Advance Directive - Living will;Healthcare Power of [place];Living will Living will;Healthcare Power of [place] in Chart? - - - No - copy requested -  Would patient like information on creating a medical advance directive? Yes (MAU/Ambulatory/Procedural Areas - Information given) - - - -    Tobacco Social History   Tobacco Use  Smoking Status Former Smoker  . Packs/day: 1.50  . Years: 3.00  . Pack years: 4.50  . Types: Cigarettes  . Last attempt to quit: 04/02/1975  . Years since quitting: 43.0  Smokeless Tobacco Never Used  Tobacco Comment   smoking cessation materials not required     Counseling given: No Comment: smoking cessation materials not required  Clinical Intake:  Pre-visit preparation completed: Yes  Pain : No/denies pain   BMI - recorded: 20.04 Nutritional Status: BMI of 19-24  Normal Nutritional Risks: None Diabetes: No  How often do you need to have someone help you when you read instructions, pamphlets, or other written materials from your doctor or pharmacy?: 1 - Never  Interpreter Needed?: No  Information entered by :: AEversole, LPN  Past Medical History:  Diagnosis Date  . Allergy   . Osteoporosis    Past Surgical  History:  Procedure Laterality Date  . BREAST BIOPSY Left    neg  . TONSILLECTOMY     Family History  Problem Relation Age of Onset  . Hypertension Mother   . CAD Father   . Heart attack Father    Social History   Socioeconomic History  . Marital status: Married    Spouse name: Not on file  . Number of children: 2  . Years of education: Not on file  . Highest education level: Bachelor's degree (e.g., BA, AB, BS)  Occupational History  . Occupation: Retired  Scientific laboratory technician  . Financial resource strain: Not hard at all  . Food insecurity:    Worry: Never true    Inability: Never true  . Transportation needs:    Medical: No    Non-medical: No  Tobacco Use  . Smoking status: Former Smoker    Packs/day: 1.50    Years: 3.00    Pack years: 4.50    Types: Cigarettes    Last attempt to quit: 04/02/1975    Years since quitting: 43.0  . Smokeless tobacco: Never Used  . Tobacco comment: smoking cessation materials not required  Substance and Sexual Activity  . Alcohol use: No    Alcohol/week: 0.0 standard drinks  . Drug use: No  . Sexual activity: Not Currently  Lifestyle  . Physical activity:    Days per week: 7 days    Minutes per session: 40  min  . Stress: Not at all  Relationships  . Social connections:    Talks on phone: Patient refused    Gets together: Patient refused    Attends religious service: Patient refused    Active member of club or organization: Patient refused    Attends meetings of clubs or organizations: Patient refused    Relationship status: Married  Other Topics Concern  . Not on file  Social History Narrative  . Not on file    Outpatient Encounter Medications as of 04/26/2018  Medication Sig  . Bacillus Coagulans-Inulin (PROBIOTIC FORMULA PO) Take 1 tablet by mouth every evening.  . Calcium-Phosphorus-Vitamin D (CITRACAL +D3 PO) Take 2 tablets by mouth 2 (two) times daily.  . Magnesium 250 MG TABS Take 1 tablet by mouth daily.  . MULTIPLE  VITAMINS PO Take 1 tablet by mouth 2 (two) times daily.  Marland Kitchen PANCREATIN PO Take 1,300 mg by mouth 3 (three) times daily.   . Quercetin 250 MG TABS Take 500 mg by mouth 2 (two) times daily.  . vitamin C (ASCORBIC ACID) 500 MG tablet Take 500 mg by mouth daily.  . Chromium 200 MCG TABS Take 1 tablet by mouth.  . QUEtiapine (SEROQUEL) 25 MG tablet Take 1 tablet (25 mg total) by mouth at bedtime. (Patient taking differently: Take 25 mg by mouth at bedtime. Tapering off -)   No facility-administered encounter medications on file as of 04/26/2018.     Activities of Daily Living In your present state of health, do you have any difficulty performing the following activities: 04/26/2018  Hearing? N  Comment denies hearing aids  Vision? N  Comment wears eyeglasses  Difficulty concentrating or making decisions? N  Walking or climbing stairs? N  Dressing or bathing? N  Doing errands, shopping? N  Preparing Food and eating ? N  Comment denies dentures  Using the Toilet? N  In the past six months, have you accidently leaked urine? N  Do you have problems with loss of bowel control? N  Managing your Medications? N  Managing your Finances? N  Housekeeping or managing your Housekeeping? N  Some recent data might be hidden    Patient Care Team: Glean Hess, MD as PCP - General (Family Medicine) Benjaman Kindler, MD as Consulting Physician (Obstetrics and Gynecology)    Assessment:   This is a routine wellness examination for [place].  Exercise Activities and Dietary recommendations Current Exercise Habits: Structured exercise class, Type of exercise: walking, Time (Minutes): 40, Frequency (Times/Week): 7, Weekly Exercise (Minutes/Week): 280, Intensity: Mild, Exercise limited by: None identified  Goals    . DIET - INCREASE LEAN PROTEINS     Recommend to drink 1-2 protein shakes per day    . DIET - INCREASE WATER INTAKE     Recommend to drink at least 6-8 8oz glasses of water per day.         Fall Risk Fall Risk  04/26/2018 04/20/2017 03/20/2017 11/14/2015  Falls in the past year? Yes No No No  Comment wave knocker her down - Emmi Telephone Survey: data to providers prior to load -  Number falls in past yr: 1 - - -  Injury with Fall? No - - -  Risk for fall due to : Impaired vision - - -  Risk for fall due to: Comment wears eyeglasses - - -  Follow up Falls evaluation completed;Education provided;Falls prevention discussed - - -   FALL RISK PREVENTION PERTAINING TO HOME: Is your  home free of loose throw rugs in walkways, pet beds, electrical cords, etc? Yes Is there adequate lighting in your home to reduce risk of falls?  Yes Are there stairs in or around your home WITH handrails? No stairs  ASSISTIVE DEVICES UTILIZED TO PREVENT FALLS: Use of a cane, walker or w/c? No Grab bars in the bathroom? Yes  Shower chair or a place to sit while bathing? Yes An elevated toilet seat or a handicapped toilet? No  Timed Get Up and Go Performed: Yes. Pt ambulated 10 feet within 7 sec. Gait stead-fast and without the use of an assistive device. No intervention required at this time. Fall risk prevention has been discussed.  Community Resource Referral:  Pt declined my offer to send [employer] Referral to Care Guide for an elevated toilet seat.  Depression Screen PHQ 2/9 Scores 04/26/2018 04/20/2017 11/14/2015  PHQ - 2 Score 0 0 0  PHQ- 9 Score 0 - -     Cognitive Function     6CIT Screen 04/26/2018 04/20/2017  What Year? 0 points 0 points  What month? 0 points 0 points  What time? 0 points 0 points  Count back from 20 0 points 0 points  Months in reverse 0 points 0 points  Repeat phrase 0 points 0 points  Total Score 0 0    Immunization History  Administered Date(s) Administered  . Influenza-Unspecified 07/06/2015  . Pneumococcal Conjugate-13 11/09/2017    Qualifies for Shingles Vaccine? Yes. Due for Shingrix. Education has been provided regarding the importance  of this vaccine. Pt has been advised to call insurance company to determine out of pocket expense. Advised may also receive vaccine at local pharmacy or Health Dept. Verbalized acceptance and understanding.  Due for Tdap vaccine. Education has been provided regarding the importance of this vaccine. Advised may receive this vaccine at local pharmacy or Health Dept. Aware to provide a copy of the vaccination record if obtained from local pharmacy or Health Dept. Verbalized acceptance and understanding.   Screening Tests Health Maintenance  Topic Date Due  . INFLUENZA VACCINE  04/01/2018  . TETANUS/TDAP  05/02/2018 (Originally 07/27/1970)  . Hepatitis C Screening  04/27/2019 (Originally 1951-06-03)  . MAMMOGRAM  07/10/2018  . PNA vac Low Risk Adult (2 of 2 - PPSV23) 11/10/2018  . COLONOSCOPY  10/16/2024  . DEXA SCAN  Completed    Cancer Screenings: Lung: Low Dose CT Chest recommended if Age 70-80 years, 30 pack-year currently smoking OR have quit w/in 15years. Patient does not qualify. Breast:  Up to date on Mammogram? Yes. Completed 07/10/17. Repeat every year   Up to date of Bone Density/Dexa? Yes. Completed 06/29/17. Results reflect osteoporosis. Repeat every 2 years Colorectal: Completed 10/16/14. Repeat every 10 years  Additional Screenings: Hepatitis C Screening: Declined    Plan:  I have personally reviewed and addressed the Medicare Annual Wellness questionnaire and have noted the following in the patient's chart:  A. Medical and social history B. Use of alcohol, tobacco or illicit drugs  C. Current medications and supplements D. Functional ability and status E.  Nutritional status F.  Physical activity G. Advance directives H. List of other physicians I.  Hospitalizations, surgeries, and ER visits in previous 12 months J.  [place] such as hearing and vision if needed, cognitive and depression L. Referrals and appointments  In addition, I have reviewed and  discussed with patient certain preventive protocols, quality metrics, and best practice recommendations. A written personalized care plan for  preventive services as well as general preventive health recommendations were provided to patient.  Signed,  Aleatha Borer, LPN Nurse Health Advisor  MD Recommendations: Due for Shingrix. Education has been provided regarding the importance of this vaccine. Pt has been advised to call insurance company to determine out of pocket expense. Advised may also receive vaccine at local pharmacy or Health Dept. Verbalized acceptance and understanding.  Due for Tdap vaccine. Education has been provided regarding the importance of this vaccine. Advised may receive this vaccine at local pharmacy or Health Dept. Aware to provide a copy of the vaccination record if obtained from local pharmacy or Health Dept. Verbalized acceptance and understanding.

## 2018-04-26 NOTE — Patient Instructions (Addendum)
[name] , Thank you for taking time to come for your Medicare Wellness Visit. I appreciate your ongoing commitment to your health goals. Please review the following plan we discussed and let me know if I can assist you in the future.   Screening recommendations/referrals: Colorectal Screening: Up to date Mammogram: Up to date Bone Density: Up to date  Vision and Dental Exams: Recommended annual ophthalmology exams for early detection of glaucoma and other disorders of the eye Recommended annual dental exams for proper oral hygiene  Vaccinations: Influenza vaccine: Up to date Pneumococcal vaccine: Up to date Tdap vaccine: Declined. Please call your insurance company to determine your out of pocket expense. You may also receive this vaccine at your local pharmacy or Health Dept. Shingles vaccine: Please call your insurance company to determine your out of pocket expense for the Shingrix vaccine. You may also receive this vaccine at your local pharmacy or Health Dept.    Advanced directives: Please bring a copy of your POA (Power of Attorney) and/or Living Will to your next appointment.  Goals: Recommend to drink at least 6-8 8oz glasses of water per day.  Next appointment: Please schedule your Annual Wellness Visit with your Nurse Health Advisor in one year.  Preventive Care 22 Years and Older, Female Preventive care refers to lifestyle choices and visits with your health care provider that can promote health and wellness. What does preventive care include?  A yearly physical exam. This is also called an annual well check.  Dental exams once or twice a year.  Routine eye exams. Ask your health care provider how often you should have your eyes checked.  Personal lifestyle choices, including:  Daily care of your teeth and gums.  Regular physical activity.  Eating a healthy diet.  Avoiding tobacco and drug use.  Limiting alcohol use.  Practicing safe sex.  Taking low-dose  aspirin every day.  Taking vitamin and mineral supplements as recommended by your health care provider. What happens during an annual well check? The services and screenings done by your health care provider during your annual well check will depend on your age, overall health, lifestyle risk factors, and family history of disease. Counseling  Your health care provider may ask you questions about your:  Alcohol use.  Tobacco use.  Drug use.  Emotional well-being.  Home and relationship well-being.  Sexual activity.  Eating habits.  History of falls.  Memory and ability to understand (cognition).  Work and work Statistician.  Reproductive health. Screening  You may have the following tests or measurements:  Height, weight, and BMI.  Blood pressure.  Lipid and cholesterol levels. These may be checked every 5 years, or more frequently if you are over 68 years old.  Skin check.  Lung cancer screening. You may have this screening every year starting at age 68 if you have a 30-pack-year history of smoking and currently smoke or have quit within the past 15 years.  Fecal occult blood test (FOBT) of the stool. You may have this test every year starting at age 21.  Flexible sigmoidoscopy or colonoscopy. You may have a sigmoidoscopy every 5 years or a colonoscopy every 10 years starting at age 40.  Hepatitis C blood test.  Hepatitis B blood test.  Sexually transmitted disease (STD) testing.  Diabetes screening. This is done by checking your blood sugar (glucose) after you have not eaten for a while (fasting). You may have this done every 1-3 years.  Bone density scan. This  is done to screen for osteoporosis. You may have this done starting at age 68.  Mammogram. This may be done every 1-2 years. Talk to your health care provider about how often you should have regular mammograms. Talk with your health care provider about your test results, treatment options, and if  necessary, the need for more tests. Vaccines  Your health care provider may recommend certain vaccines, such as:  Influenza vaccine. This is recommended every year.  Tetanus, diphtheria, and acellular pertussis (Tdap, Td) vaccine. You may need a Td booster every 10 years.  Zoster vaccine. You may need this after age 68.  Pneumococcal 13-valent conjugate (PCV13) vaccine. One dose is recommended after age 68.  Pneumococcal polysaccharide (PPSV23) vaccine. One dose is recommended after age 22. Talk to your health care provider about which screenings and vaccines you need and how often you need them. This information is not intended to replace advice given to you by your health care provider. Make sure you discuss any questions you have with your health care provider. Document Released: 09/14/2015 Document Revised: 05/07/2016 Document Reviewed: 06/19/2015 Elsevier Interactive Patient Education  2017 [employer] Prevention in the Home Falls can cause injuries. They can happen to people of all ages. There are many things you can do to make your home safe and to help prevent falls. What can I do on the outside of my home?  Regularly fix the edges of walkways and driveways and fix any cracks.  Remove anything that might make you trip as you walk through a door, such as a raised step or threshold.  Trim any bushes or trees on the path to your home.  Use bright outdoor lighting.  Clear any walking paths of anything that might make someone trip, such as rocks or tools.  Regularly check to see if handrails are loose or broken. Make sure that both sides of any steps have handrails.  Any raised decks and porches should have guardrails on the edges.  Have any leaves, snow, or ice cleared regularly.  Use sand or salt on walking paths during winter.  Clean up any spills in your garage right away. This includes oil or grease spills. What can I do in the bathroom?  Use night  lights.  Install grab bars by the toilet and in the tub and shower. Do not use towel bars as grab bars.  Use non-skid mats or decals in the tub or shower.  If you need to sit down in the shower, use a plastic, non-slip stool.  Keep the floor dry. Clean up any water that spills on the floor as soon as it happens.  Remove soap buildup in the tub or shower regularly.  Attach bath mats securely with double-sided non-slip rug tape.  Do not have throw rugs and other things on the floor that can make you trip. What can I do in the bedroom?  Use night lights.  Make sure that you have a light by your bed that is easy to reach.  Do not use any sheets or blankets that are too big for your bed. They should not hang down onto the floor.  Have a firm chair that has side arms. You can use this for support while you get dressed.  Do not have throw rugs and other things on the floor that can make you trip. What can I do in the kitchen?  Clean up any spills right away.  Avoid walking on wet floors.  Keep items that you use a lot in easy-to-reach places.  If you need to reach something above you, use a strong step stool that has a grab bar.  Keep electrical cords out of the way.  Do not use floor polish or wax that makes floors slippery. If you must use wax, use non-skid floor wax.  Do not have throw rugs and other things on the floor that can make you trip. What can I do with my stairs?  Do not leave any items on the stairs.  Make sure that there are handrails on both sides of the stairs and use them. Fix handrails that are broken or loose. Make sure that handrails are as long as the stairways.  Check any carpeting to make sure that it is firmly attached to the stairs. Fix any carpet that is loose or worn.  Avoid having throw rugs at the top or bottom of the stairs. If you do have throw rugs, attach them to the floor with carpet tape.  Make sure that you have a light switch at the  top of the stairs and the bottom of the stairs. If you do not have them, ask someone to add them for you. What else can I do to help prevent falls?  Wear shoes that:  Do not have high heels.  Have rubber bottoms.  Are comfortable and fit you well.  Are closed at the toe. Do not wear sandals.  If you use a stepladder:  Make sure that it is fully opened. Do not climb a closed stepladder.  Make sure that both sides of the stepladder are locked into place.  Ask someone to hold it for you, if possible.  Clearly mark and make sure that you can see:  Any grab bars or handrails.  First and last steps.  Where the edge of each step is.  Use tools that help you move around (mobility aids) if they are needed. These include:  Canes.  Walkers.  Scooters.  Crutches.  Turn on the lights when you go into a dark area. Replace any light bulbs as soon as they burn out.  Set up your furniture so you have a clear path. Avoid moving your furniture around.  If any of your floors are uneven, fix them.  If there are any pets around you, be aware of where they are.  Review your medicines with your doctor. Some medicines can make you feel dizzy. This can increase your chance of falling. Ask your doctor what other things that you can do to help prevent falls. This information is not intended to replace advice given to you by your health care provider. Make sure you discuss any questions you have with your health care provider. Document Released: 06/14/2009 Document Revised: 01/24/2016 Document Reviewed: 09/22/2014 Elsevier Interactive Patient Education  2017 [employer].

## 2018-06-07 ENCOUNTER — Ambulatory Visit (INDEPENDENT_AMBULATORY_CARE_PROVIDER_SITE_OTHER): Payer: Medicare Other | Admitting: Internal Medicine

## 2018-06-07 ENCOUNTER — Ambulatory Visit
Admission: RE | Admit: 2018-06-07 | Discharge: 2018-06-07 | Disposition: A | Discharge status: Home / Self Care | Payer: Medicare Other | Source: Ambulatory Visit | Attending: Internal Medicine | Admitting: Internal Medicine

## 2018-06-07 ENCOUNTER — Encounter: Payer: Self-pay | Admitting: Internal Medicine

## 2018-06-07 VITALS — BP 112/60 | HR 72 | Temp 98.0°F | Ht 68.0 in | Wt 132.6 lb

## 2018-06-07 DIAGNOSIS — J189 Pneumonia, unspecified organism: Secondary | ICD-10-CM

## 2018-06-07 DIAGNOSIS — R509 Fever, unspecified: Secondary | ICD-10-CM | POA: Insufficient documentation

## 2018-06-07 DIAGNOSIS — R918 Other nonspecific abnormal finding of lung field: Secondary | ICD-10-CM | POA: Insufficient documentation

## 2018-06-07 DIAGNOSIS — R05 Cough: Secondary | ICD-10-CM | POA: Diagnosis not present

## 2018-06-07 MED ORDER — LEVOFLOXACIN 500 MG PO TABS: 500.0000 mg | ORAL_TABLET | Freq: Every day | ORAL | 0 refills | Status: DC

## 2018-06-07 NOTE — Patient Instructions (Signed)
Continue Mucinex and Zyrtec  Drink and eat regularly while taking antibiotics.

## 2018-06-07 NOTE — Progress Notes (Signed)
Date:  06/07/2018   Name:  Audrey Hayden   DOB:  October 15, 1950   MRN:  [id number]   Chief Complaint: Nasal Congestion (Cough with yellow mucous and blood. Congestion. Started Friday morning. Trying Zyrtec over the counter. Fever lastnight of 99.1. Husband heard rattling in lower back lungs. )  Sinusitis  This is a new problem. The current episode started in the past 7 days. The problem is unchanged. The maximum temperature recorded prior to her arrival was 100.4 - 100.9 F. The pain is mild. Associated symptoms include chills, congestion, coughing and sinus pressure. Pertinent negatives include no headaches or shortness of breath. Treatments tried: zyrtec.  Cough  This is a new problem. The current episode started yesterday. The problem has been unchanged. The problem occurs every few hours. The cough is productive of purulent sputum. Associated symptoms include chills, a fever and wheezing. Pertinent negatives include no chest pain, headaches, shortness of breath or sweats. The symptoms are aggravated by lying down and exercise. She has tried OTC cough suppressant for the symptoms. The treatment provided moderate relief.    Review of Systems  Constitutional: Positive for chills and fever.  HENT: Positive for congestion and sinus pressure.   Respiratory: Positive for cough and wheezing. Negative for shortness of breath.   Cardiovascular: Negative for chest pain and palpitations.  Neurological: Negative for dizziness and headaches.    Patient Active Problem List   Diagnosis Date Noted  . Chronic left-sided thoracic back pain 12/09/2017  . Anxiety 12/09/2017  . Environmental and seasonal allergies 06/19/2015  . 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 Osteoporosis 06/19/2015  . Abnormal Pap smear of cervix 06/19/2015  . Elevated HDL 06/19/2015  . History of gastritis 11/08/2014  . Cough, persistent 08/14/2014    Allergies  Allergen Reactions  . Amoxicillin Other  (See Comments)  . Cefdinir     Other reaction(s): Other (See Comments) Bright red face and ankle swelling: possible reaction to the medicine.   . Clarithromycin Other (See Comments)    Causes her to faint.  . Codeine Other (See Comments)    Causes her GI upset.  Marland Kitchen Hydromorphone Hcl Other (See Comments)  . Meperidine Other (See Comments)  . Moxifloxacin Other (See Comments)    Dizziness   . Nsaids     Other reaction(s): Unknown  . Omeprazole Other (See Comments)    Ears ringing with more than 20mg /day  . Other Other (See Comments)    All profins cause her to faint. All the mycins cause her to faint.  . Prednisone Other (See Comments)    Bright red face and ankle swelling: possible reaction to the medicine.  . Pseudoephedrine Hcl     Other reaction(s): Syncope  . Ranitidine     Other reaction(s): Other (See Comments) SE noted with high dosage Dk urine and stools, HA  . Salicylates   . Aspirin Hives and Rash  . Doxycycline Other (See Comments)    Headache. "Pressure on the brain''  . Montelukast Sodium Palpitations    Past Surgical History:  Procedure Laterality Date  . BREAST BIOPSY Left    neg  . TONSILLECTOMY      Social History   Tobacco Use  . Smoking status: Former Smoker    Packs/day: 1.50    Years: 3.00    Pack years: 4.50    Types: Cigarettes    Last attempt to quit: 04/02/1975    Years since quitting: 43.2  .  Smokeless tobacco: Never Used  . Tobacco comment: smoking cessation materials not required  Substance Use Topics  . Alcohol use: No    Alcohol/week: 0.0 standard drinks  . Drug use: No     Medication list has been reviewed and updated.  Current Meds  Medication Sig  . Bacillus Coagulans-Inulin (PROBIOTIC FORMULA PO) Take 1 tablet by mouth every evening.  . Calcium-Phosphorus-Vitamin D (CITRACAL +D3 PO) Take 2 tablets by mouth 2 (two) times daily.  . Chromium 200 MCG TABS Take 1 tablet by mouth.  . Magnesium 250 MG TABS Take 1 tablet by  mouth daily.  . MULTIPLE VITAMINS PO Take 1 tablet by mouth 2 (two) times daily.  Marland Kitchen PANCREATIN PO Take 1,300 mg by mouth 3 (three) times daily.   . Quercetin 250 MG TABS Take 500 mg by mouth 2 (two) times daily.  . QUEtiapine (SEROQUEL) 25 MG tablet Take 1 tablet (25 mg total) by mouth at bedtime. (Patient taking differently: Take 25 mg by mouth at bedtime. Tapering off -)  . vitamin C (ASCORBIC ACID) 500 MG tablet Take 500 mg by mouth daily.    PHQ 2/9 Scores 04/26/2018 04/20/2017 11/14/2015  PHQ - 2 Score 0 0 0  PHQ- 9 Score 0 - -    Physical Exam  Constitutional: She is oriented to person, place, and time. She appears well-developed. No distress.  HENT:  Head: Normocephalic and atraumatic.  Right Ear: Tympanic membrane and ear canal normal.  Left Ear: Tympanic membrane and ear canal normal.  Nose: Right sinus exhibits maxillary sinus tenderness. Right sinus exhibits no frontal sinus tenderness. Left sinus exhibits maxillary sinus tenderness. Left sinus exhibits no frontal sinus tenderness.  Mouth/Throat: No posterior oropharyngeal edema or posterior oropharyngeal erythema.  Cardiovascular: Normal rate, regular rhythm and normal heart sounds.  Pulmonary/Chest: Effort normal. No respiratory distress. She has wheezes in the left lower field. She has no rhonchi. She has no rales.  Musculoskeletal: Normal range of motion.  Neurological: She is alert and oriented to person, place, and time.  Skin: Skin is warm and dry. No rash noted.  Psychiatric: She has a normal mood and affect. Her behavior is normal. Thought content normal.  Nursing note and vitals reviewed.   BP 112/60 (BP Location: Right Arm, Patient Position: Sitting, Cuff Size: Normal)   Pulse 72   Temp 98 F (36.7 C) (Oral)   Ht 5\' 8"  (1.727 m)   Wt 132 lb 9.6 oz (60.1 kg)   SpO2 97%   BMI 20.16 kg/m   Assessment and Plan: 1. Community acquired pneumonia, unspecified laterality Intolerant of many antibiotics but she  thinks she can take Levofloxin Continue Mucinex and Zyrtec - DG Chest 2 View; Future - levofloxacin (LEVAQUIN) 500 MG tablet; Take 1 tablet (500 mg total) by mouth daily.  Dispense: 7 tablet; Refill: 0   Partially dictated using Editor, commissioning. Any errors are unintentional.  Halina Maidens, MD [facility]  06/07/2018

## 2018-06-07 NOTE — Progress Notes (Signed)
Patient informed. Will callback in 4 weeks to have CXR reordered.

## 2018-07-13 ENCOUNTER — Ambulatory Visit
Admission: RE | Admit: 2018-07-13 | Discharge: 2018-07-13 | Disposition: A | Discharge status: Home / Self Care | Payer: Medicare Other | Source: Ambulatory Visit | Attending: Internal Medicine | Admitting: Internal Medicine

## 2018-07-13 DIAGNOSIS — Z1239 Encounter for other screening for malignant neoplasm of breast: Secondary | ICD-10-CM

## 2018-07-13 DIAGNOSIS — Z1231 Encounter for screening mammogram for malignant neoplasm of breast: Secondary | ICD-10-CM | POA: Diagnosis not present

## 2018-07-16 ENCOUNTER — Telehealth: Payer: Self-pay | Admitting: Internal Medicine

## 2018-07-16 NOTE — Telephone Encounter (Signed)
For documentation purposes we need the patient to be scheduled before we order the follow up xray. Please schedule patient for next week.  Thank you.

## 2018-07-16 NOTE — Telephone Encounter (Signed)
Pt was told to come back in for an xray, follow up after pneumonia

## 2018-07-19 ENCOUNTER — Other Ambulatory Visit: Payer: Self-pay | Admitting: Internal Medicine

## 2018-07-19 ENCOUNTER — Telehealth: Payer: Self-pay

## 2018-07-19 DIAGNOSIS — J189 Pneumonia, unspecified organism: Secondary | ICD-10-CM

## 2018-07-19 NOTE — Telephone Encounter (Signed)
Called trying to reach the patient to inform. No answer. Left Vm informing Xray was ordered and she can come anytime this week to downstairs lab. Told to call back with any questions.

## 2018-07-19 NOTE — Telephone Encounter (Signed)
Order placed.  She can come anytime this week.

## 2018-07-19 NOTE — Telephone Encounter (Signed)
Patient called stating she had pneumonia a month ago and was told to come back around this time to have a repeat Xray to make sure it's cleared and gone. Wanted to know if we can place chest xray order for her to come here this week and have this repeated.  Please Advise.

## 2018-07-20 ENCOUNTER — Ambulatory Visit
Admission: RE | Admit: 2018-07-20 | Discharge: 2018-07-20 | Disposition: A | Discharge status: Home / Self Care | Payer: Medicare Other | Source: Ambulatory Visit | Attending: Internal Medicine | Admitting: Internal Medicine

## 2018-07-20 DIAGNOSIS — R05 Cough: Secondary | ICD-10-CM | POA: Diagnosis not present

## 2018-07-20 DIAGNOSIS — J189 Pneumonia, unspecified organism: Secondary | ICD-10-CM | POA: Insufficient documentation

## 2018-09-06 ENCOUNTER — Telehealth: Payer: Self-pay

## 2018-09-06 ENCOUNTER — Other Ambulatory Visit: Payer: Self-pay | Admitting: Internal Medicine

## 2018-09-06 DIAGNOSIS — J189 Pneumonia, unspecified organism: Secondary | ICD-10-CM

## 2018-09-06 NOTE — Telephone Encounter (Signed)
Ordered

## 2018-09-06 NOTE — Telephone Encounter (Signed)
Patient informed. Will go today.

## 2018-09-06 NOTE — Telephone Encounter (Signed)
Patient called and left VM stating its time to have her chest Xray re-done to make sure pneumonia has cleared. Would like to go today if possible to have rechecked. Needs order for recheck chest Xray to downstairs imaging.  Please Advise.

## 2018-09-07 ENCOUNTER — Ambulatory Visit
Admission: RE | Admit: 2018-09-07 | Discharge: 2018-09-07 | Disposition: A | Discharge status: Home / Self Care | Payer: Medicare Other | Source: Ambulatory Visit | Attending: Internal Medicine | Admitting: Internal Medicine

## 2018-09-07 ENCOUNTER — Ambulatory Visit
Admission: RE | Admit: 2018-09-07 | Discharge: 2018-09-07 | Disposition: A | Discharge status: Home / Self Care | Payer: Medicare Other | Attending: Internal Medicine | Admitting: Internal Medicine

## 2018-09-07 DIAGNOSIS — J189 Pneumonia, unspecified organism: Secondary | ICD-10-CM | POA: Diagnosis not present

## 2018-12-15 DIAGNOSIS — K210 Gastro-esophageal reflux disease with esophagitis: Secondary | ICD-10-CM | POA: Diagnosis not present

## 2018-12-15 DIAGNOSIS — K2970 Gastritis, unspecified, without bleeding: Secondary | ICD-10-CM | POA: Diagnosis not present

## 2018-12-15 DIAGNOSIS — R05 Cough: Secondary | ICD-10-CM | POA: Diagnosis not present

## 2018-12-31 HISTORY — PX: UPPER GI ENDOSCOPY: SHX6162

## 2019-01-20 DIAGNOSIS — Z1159 Encounter for screening for other viral diseases: Secondary | ICD-10-CM | POA: Diagnosis not present

## 2019-01-25 DIAGNOSIS — K2950 Unspecified chronic gastritis without bleeding: Secondary | ICD-10-CM | POA: Diagnosis not present

## 2019-01-25 DIAGNOSIS — K210 Gastro-esophageal reflux disease with esophagitis: Secondary | ICD-10-CM | POA: Diagnosis not present

## 2019-01-25 DIAGNOSIS — K2970 Gastritis, unspecified, without bleeding: Secondary | ICD-10-CM | POA: Diagnosis not present

## 2019-01-25 DIAGNOSIS — D131 Benign neoplasm of stomach: Secondary | ICD-10-CM | POA: Diagnosis not present

## 2019-01-25 DIAGNOSIS — K317 Polyp of stomach and duodenum: Secondary | ICD-10-CM | POA: Diagnosis not present

## 2019-02-08 DIAGNOSIS — Z87442 Personal history of urinary calculi: Secondary | ICD-10-CM | POA: Diagnosis not present

## 2019-02-08 DIAGNOSIS — R1032 Left lower quadrant pain: Secondary | ICD-10-CM | POA: Diagnosis not present

## 2019-02-08 DIAGNOSIS — R300 Dysuria: Secondary | ICD-10-CM | POA: Diagnosis not present

## 2019-02-08 DIAGNOSIS — R319 Hematuria, unspecified: Secondary | ICD-10-CM | POA: Diagnosis not present

## 2019-02-10 ENCOUNTER — Other Ambulatory Visit: Payer: Self-pay | Admitting: Student

## 2019-02-10 DIAGNOSIS — R1084 Generalized abdominal pain: Secondary | ICD-10-CM | POA: Diagnosis not present

## 2019-02-10 DIAGNOSIS — R634 Abnormal weight loss: Secondary | ICD-10-CM | POA: Diagnosis not present

## 2019-02-10 DIAGNOSIS — K210 Gastro-esophageal reflux disease with esophagitis: Secondary | ICD-10-CM | POA: Diagnosis not present

## 2019-02-10 DIAGNOSIS — R195 Other fecal abnormalities: Secondary | ICD-10-CM | POA: Diagnosis not present

## 2019-02-10 DIAGNOSIS — K30 Functional dyspepsia: Secondary | ICD-10-CM | POA: Diagnosis not present

## 2019-02-10 DIAGNOSIS — R140 Abdominal distension (gaseous): Secondary | ICD-10-CM

## 2019-02-10 DIAGNOSIS — R05 Cough: Secondary | ICD-10-CM | POA: Diagnosis not present

## 2019-02-10 DIAGNOSIS — K2950 Unspecified chronic gastritis without bleeding: Secondary | ICD-10-CM | POA: Diagnosis not present

## 2019-02-11 ENCOUNTER — Other Ambulatory Visit
Admission: RE | Admit: 2019-02-11 | Discharge: 2019-02-11 | Disposition: A | Discharge status: Home / Self Care | Payer: Medicare Other | Source: Ambulatory Visit | Attending: Student | Admitting: Student

## 2019-02-11 DIAGNOSIS — R195 Other fecal abnormalities: Secondary | ICD-10-CM | POA: Diagnosis not present

## 2019-02-11 LAB — GASTROINTESTINAL PANEL BY PCR, STOOL (REPLACES STOOL CULTURE)

## 2019-02-11 LAB — C DIFFICILE QUICK SCREEN W PCR REFLEX??
C Diff antigen: NEGATIVE
C Diff toxin: NEGATIVE

## 2019-02-11 LAB — C DIFFICILE QUICK SCREEN W PCR REFLEX: C Diff interpretation: NOT DETECTED

## 2019-02-17 LAB — PANCREATIC ELASTASE, FECAL: Pancreatic Elastase-1, Stool: >500 ug Elast./g (ref >200)

## 2019-02-17 LAB — CALPROTECTIN, FECAL: Calprotectin, Fecal: 19 ug/g (ref 0–120)

## 2019-02-21 ENCOUNTER — Encounter
Admission: RE | Admit: 2019-02-21 | Discharge: 2019-02-21 | Disposition: A | Discharge status: Home / Self Care | Payer: Medicare Other | Source: Ambulatory Visit | Attending: Student | Admitting: Student

## 2019-02-21 ENCOUNTER — Other Ambulatory Visit: Payer: Self-pay

## 2019-02-21 DIAGNOSIS — R140 Abdominal distension (gaseous): Secondary | ICD-10-CM | POA: Insufficient documentation

## 2019-02-21 MED ORDER — TECHNETIUM TC 99M SULFUR COLLOID
2.0000 | Freq: Once | INTRAVENOUS | Status: AC | PRN
  Administered 2019-02-21: 2.68 via ORAL

## 2019-02-25 ENCOUNTER — Encounter: Payer: Self-pay | Admitting: Internal Medicine

## 2019-02-25 ENCOUNTER — Ambulatory Visit (INDEPENDENT_AMBULATORY_CARE_PROVIDER_SITE_OTHER): Payer: Medicare Other | Admitting: Internal Medicine

## 2019-02-25 ENCOUNTER — Other Ambulatory Visit: Payer: Self-pay

## 2019-02-25 VITALS — BP 112/86 | HR 79 | Ht 68.0 in | Wt 125.6 lb

## 2019-02-25 DIAGNOSIS — I781 Nevus, non-neoplastic: Secondary | ICD-10-CM | POA: Insufficient documentation

## 2019-02-25 DIAGNOSIS — R310 Gross hematuria: Secondary | ICD-10-CM

## 2019-02-25 DIAGNOSIS — R1032 Left lower quadrant pain: Secondary | ICD-10-CM

## 2019-02-25 HISTORY — DX: Gross hematuria: R31.0

## 2019-02-25 LAB — POC URINALYSIS WITH MICROSCOPIC (NON AUTO)MANUAL RESULT
Bacteria, UA: 0
Bilirubin, UA: NEGATIVE
Epithelial cells, urine per micros: 0
Glucose, UA: NEGATIVE
Ketones, UA: NEGATIVE
Leukocytes, UA: NEGATIVE
Mucus, UA: 0
Nitrite, UA: NEGATIVE
Protein, UA: NEGATIVE
RBC: 2 M/uL — AB (ref 4.04–5.48)
Spec Grav, UA: 1.01 (ref 1.010–1.025)
Urobilinogen, UA: 0.2 E.U./dL
WBC Casts, UA: 0
pH, UA: 6.5 (ref 5.0–8.0)

## 2019-02-25 NOTE — Progress Notes (Signed)
Date:  02/25/2019   Name:  Audrey Hayden   DOB:  [birth year]   MRN:  [id number]   Chief Complaint: Hematuria (Dysuria, and hematuria. Started X 1.5 weeks. Urgency and not coming out. Bloated in stomach. Feels like not urinating enough. ) and Edema (Swelling in both ankles and discoloration in both ankles and legs. )  Hematuria This is a new problem. The current episode started 1 to 4 weeks ago. The problem is unchanged. She describes the hematuria as gross hematuria. She reports clotting at the end of her urine stream. She is experiencing no pain. She describes her urine color as light pink. Irritative symptoms include frequency. Associated symptoms include abdominal pain. Pertinent negatives include no chills, fever, flank pain, genital pain, inability to urinate, nausea or vomiting.  Abdominal Pain This is a recurrent problem. The pain is located in the LLQ. The pain is mild. The quality of the pain is cramping. The abdominal pain does not radiate. Associated symptoms include diarrhea (change in bowel color and consistency), frequency and hematuria. Pertinent negatives include no arthralgias, fever, headaches, nausea or vomiting. Prior workup: currently being worked up by GI.  GI eval for pathogens was negative.  Recently gastric emptying study was normal  Now being referred for manometry.  Review of Systems  Constitutional: Positive for unexpected weight change. Negative for chills, fatigue and fever.  Respiratory: Negative for chest tightness, shortness of breath and wheezing.   Cardiovascular: Negative for chest pain, palpitations and leg swelling.  Gastrointestinal: Positive for abdominal pain and diarrhea (change in bowel color and consistency). Negative for nausea and vomiting.  Genitourinary: Positive for frequency and hematuria. Negative for flank pain.  Musculoskeletal: Negative for arthralgias.  Skin: Positive for color change (around left ankle).  Neurological: Negative for  dizziness and headaches.  Hematological: Negative for adenopathy.  Psychiatric/Behavioral: Negative for sleep disturbance.    Patient Active Problem List   Diagnosis Date Noted  . Chronic left-sided thoracic back pain 12/09/2017  . Anxiety 12/09/2017  . Environmental and seasonal allergies 06/19/2015  . 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 Osteoporosis 06/19/2015  . Abnormal Pap smear of cervix 06/19/2015  . Elevated HDL 06/19/2015  . History of gastritis 11/08/2014  . Cough, persistent 08/14/2014    Allergies  Allergen Reactions  . Amoxicillin Other (See Comments)    Tingling around the mouth  . Cefdinir     Other reaction(s): Other (See Comments) Bright red face and ankle swelling: possible reaction to the medicine.   . Clarithromycin Other (See Comments)    Causes her to faint.  . Codeine Other (See Comments)    Causes her GI upset.  Marland Kitchen Hydromorphone Hcl Other (See Comments)  . Meperidine Other (See Comments)  . Moxifloxacin Other (See Comments)    Dizziness   . Nsaids     Other reaction(s): Unknown  . Omeprazole Other (See Comments)    Ears ringing with more than 20mg /day  . Other Other (See Comments)    All profins cause her to faint. All the mycins cause her to faint.  . Prednisone Other (See Comments)    Bright red face and ankle swelling: possible reaction to the medicine.  . Pseudoephedrine Hcl     Other reaction(s): Syncope  . Ranitidine     Other reaction(s): Other (See Comments) SE noted with high dosage Dk urine and stools, HA  . Salicylates   . Aspirin Hives and Rash  . Doxycycline Other (See  Comments)    Headache. "Pressure on the brain''  . Montelukast Sodium Palpitations    Past Surgical History:  Procedure Laterality Date  . BREAST BIOPSY Left    neg  . TONSILLECTOMY      Social History   Tobacco Use  . Smoking status: Former Smoker    Packs/day: 1.50    Years: 3.00    Pack years: 4.50    Types: Cigarettes    Quit  date: 04/02/1975    Years since quitting: 43.9  . Smokeless tobacco: Never Used  . Tobacco comment: smoking cessation materials not required  Substance Use Topics  . Alcohol use: No    Alcohol/week: 0.0 standard drinks  . Drug use: No     Medication list has been reviewed and updated.  Current Meds  Medication Sig  . b complex vitamins tablet Take 1 tablet by mouth 2 (two) times a day.  . Calcium-Phosphorus-Vitamin D (CITRACAL +D3 PO) Take 2 tablets by mouth 2 (two) times daily.  . cetirizine (ZYRTEC) 5 MG tablet Take 5 mg by mouth daily.  . Magnesium 250 MG TABS Take 1 tablet by mouth daily.  . MULTIPLE VITAMINS PO Take 1 tablet by mouth 2 (two) times daily.  . Quercetin 250 MG TABS Take 500 mg by mouth 2 (two) times daily.    PHQ 2/9 Scores 02/25/2019 04/26/2018 04/20/2017 11/14/2015  PHQ - 2 Score 0 0 0 0  PHQ- 9 Score - 0 - -    BP Readings from Last 3 Encounters:  02/25/19 112/86  06/07/18 112/60  04/26/18 110/60    Physical Exam Vitals signs and nursing note reviewed.  Constitutional:      General: She is not in acute distress.    Appearance: Normal appearance. She is well-developed.  HENT:     Head: Normocephalic and atraumatic.  Neck:     Musculoskeletal: Normal range of motion.  Cardiovascular:     Rate and Rhythm: Normal rate and regular rhythm.     Heart sounds: No murmur.  Pulmonary:     Effort: Pulmonary effort is normal. No respiratory distress.     Breath sounds: No wheezing or rhonchi.  Abdominal:     Tenderness: There is abdominal tenderness in the left lower quadrant. There is no right CVA tenderness, left CVA tenderness, guarding or rebound.     Hernia: No hernia is present.  Musculoskeletal: Normal range of motion.     Right lower leg: No edema.     Left lower leg: No edema.  Lymphadenopathy:     Cervical: No cervical adenopathy.  Skin:    General: Skin is warm and dry.     Capillary Refill: Capillary refill takes less than 2 seconds.      Findings: No rash.     Comments: Spider veins over both ankles and dorsum of feet L>R Mild pigment changes c/w venous insufficiency noted  Neurological:     Mental Status: She is alert and oriented to person, place, and time.  Psychiatric:        Behavior: Behavior normal.        Thought Content: Thought content normal.     Wt Readings from Last 3 Encounters:  02/25/19 125 lb 9.6 oz (57 kg)  06/07/18 132 lb 9.6 oz (60.1 kg)  04/26/18 131 lb 12.8 oz (59.8 kg)    BP 112/86   Pulse 79   Ht 5\' 8"  (1.727 m)   Wt 125 lb 9.6 oz (57 kg)  SpO2 98%   BMI 19.10 kg/m   Assessment and Plan: 1. Gross hematuria No evidence of infection Will recurrent sx will need Urology evaluation - POC urinalysis w microscopic (non auto) - Ambulatory referral to Urology  2. Left lower quadrant abdominal pain Work up underway by GI - encourage pt to follow through with recommended testing - Comprehensive metabolic panel - CBC with Differential/Platelet  3. Asymptomatic spider veins of both lower extremities Pt reassured   Partially dictated using Editor, commissioning. Any errors are unintentional.  Halina Maidens, MD [facility]  02/25/2019

## 2019-02-26 LAB — CBC WITH DIFFERENTIAL/PLATELET
Basophils Absolute: 0 10*3/uL (ref 0.0–0.2)
Basos: 0 %
EOS (ABSOLUTE): 0.1 10*3/uL (ref 0.0–0.4)
Eos: 1 %
Hematocrit: 42.2 % (ref 34.0–46.6)
Hemoglobin: 13.8 g/dL (ref 11.1–15.9)
Immature Grans (Abs): 0 10*3/uL (ref 0.0–0.1)
Immature Granulocytes: 0 %
Lymphocytes Absolute: 1.4 10*3/uL (ref 0.7–3.1)
Lymphs: 16 %
MCH: 29.5 pg (ref 26.6–33.0)
MCHC: 32.7 g/dL (ref 31.5–35.7)
MCV: 90 fL (ref 79–97)
Monocytes Absolute: 0.4 10*3/uL (ref 0.1–0.9)
Monocytes: 5 %
Neutrophils Absolute: 6.8 10*3/uL (ref 1.4–7.0)
Neutrophils: 78 %
Platelets: 228 10*3/uL (ref 150–450)
RBC: 4.68 x10E6/uL (ref 3.77–5.28)
RDW: 12 % (ref 11.7–15.4)
WBC: 8.8 10*3/uL (ref 3.4–10.8)

## 2019-02-26 LAB — COMPREHENSIVE METABOLIC PANEL
ALT: 15 IU/L (ref 0–32)
AST: 22 IU/L (ref 0–40)
Albumin/Globulin Ratio: 1.9 (ref 1.2–2.2)
Albumin: 4.5 g/dL (ref 3.8–4.8)
Alkaline Phosphatase: 92 IU/L (ref 39–117)
BUN/Creatinine Ratio: 19 (ref 12–28)
BUN: 18 mg/dL (ref 8–27)
Bilirubin Total: 0.5 mg/dL (ref 0.0–1.2)
CO2: 28 mmol/L (ref 20–29)
Calcium: 9.7 mg/dL (ref 8.7–10.3)
Chloride: 96 mmol/L (ref 96–106)
Creatinine, Ser: 0.94 mg/dL (ref 0.57–1.00)
GFR calc Af Amer: 72 mL/min/{1.73_m2} (ref >59)
GFR calc non Af Amer: 63 mL/min/{1.73_m2} (ref >59)
Globulin, Total: 2.4 g/dL (ref 1.5–4.5)
Glucose: 87 mg/dL (ref 65–99)
Potassium: 4.2 mmol/L (ref 3.5–5.2)
Sodium: 137 mmol/L (ref 134–144)
Total Protein: 6.9 g/dL (ref 6.0–8.5)

## 2019-02-28 DIAGNOSIS — R3989 Other symptoms and signs involving the genitourinary system: Secondary | ICD-10-CM | POA: Diagnosis not present

## 2019-02-28 DIAGNOSIS — R102 Pelvic and perineal pain: Secondary | ICD-10-CM | POA: Diagnosis not present

## 2019-03-08 DIAGNOSIS — R634 Abnormal weight loss: Secondary | ICD-10-CM | POA: Diagnosis not present

## 2019-04-14 ENCOUNTER — Other Ambulatory Visit: Payer: Self-pay

## 2019-04-14 ENCOUNTER — Encounter: Payer: Self-pay | Admitting: Urology

## 2019-04-14 ENCOUNTER — Ambulatory Visit (INDEPENDENT_AMBULATORY_CARE_PROVIDER_SITE_OTHER): Payer: Medicare Other | Admitting: Urology

## 2019-04-14 VITALS — BP 143/73 | HR 85 | Ht 68.0 in | Wt 123.0 lb

## 2019-04-14 DIAGNOSIS — R310 Gross hematuria: Secondary | ICD-10-CM

## 2019-04-14 LAB — URINALYSIS, COMPLETE
Bilirubin, UA: NEGATIVE
Glucose, UA: NEGATIVE
Ketones, UA: NEGATIVE
Leukocytes,UA: NEGATIVE
Nitrite, UA: NEGATIVE
Protein,UA: NEGATIVE
Specific Gravity, UA: 1.015 (ref 1.005–1.030)
Urobilinogen, Ur: 0.2 mg/dL (ref 0.2–1.0)
pH, UA: 6.5 (ref 5.0–7.5)

## 2019-04-14 LAB — MICROSCOPIC EXAMINATION: Bacteria, UA: NONE SEEN

## 2019-04-14 NOTE — Patient Instructions (Signed)
Cystoscopy Cystoscopy is a procedure that is used to help diagnose and sometimes treat conditions that affect the lower urinary tract. The lower urinary tract includes the bladder and the urethra. The urethra is the tube that drains urine from the bladder. Cystoscopy is done using a thin, tube-shaped instrument with a light and camera at the end (cystoscope). The cystoscope may be hard or flexible, depending on the goal of the procedure. The cystoscope is inserted through the urethra, into the bladder. Cystoscopy may be recommended if you have:  Urinary tract infections that keep coming back.  Blood in the urine (hematuria).  An inability to control when you urinate (urinary incontinence) or an overactive bladder.  Unusual cells found in a urine sample.  A blockage in the urethra, such as a urinary stone.  Painful urination.  An abnormality in the bladder found during an intravenous pyelogram (IVP) or CT scan. Cystoscopy may also be done to remove a sample of tissue to be examined under a microscope (biopsy). Tell a health care provider about:  Any allergies you have.  All medicines you are taking, including vitamins, herbs, eye drops, creams, and over-the-counter medicines.  Any problems you or family members have had with anesthetic medicines.  Any blood disorders you have.  Any surgeries you have had.  Any medical conditions you have.  Whether you are pregnant or may be pregnant. What are the risks? Generally, this is a safe procedure. However, problems may occur, including:  Infection.  Bleeding.  Allergic reactions to medicines.  Damage to other structures or organs. What happens before the procedure?  Ask your health care provider about: ? Changing or stopping your regular medicines. This is especially important if you are taking diabetes medicines or blood thinners. ? Taking medicines such as aspirin and ibuprofen. These medicines can thin your blood. Do not take  these medicines unless your health care provider tells you to take them. ? Taking over-the-counter medicines, vitamins, herbs, and supplements.  Follow instructions from your health care provider about eating or drinking restrictions.  Ask your health care provider what steps will be taken to help prevent infection. These may include: ? Washing skin with a germ-killing soap. ? Taking antibiotic medicine.  You may have an exam or testing, such as: ? X-rays of the bladder, urethra, or kidneys. ? Urine tests to check for signs of infection.  Plan to have someone take you home from the hospital or clinic. What happens during the procedure?   You will be given one or more of the following: ? A medicine to help you relax (sedative). ? A medicine to numb the area (local anesthetic).  The area around the opening of your urethra will be cleaned.  The cystoscope will be passed through your urethra into your bladder.  Germ-free (sterile) fluid will flow through the cystoscope to fill your bladder. The fluid will stretch your bladder so that your health care provider can clearly examine your bladder walls.  Your doctor will look at the urethra and bladder. Your doctor may take a biopsy or remove stones.  The cystoscope will be removed, and your bladder will be emptied. The procedure may vary among health care providers and hospitals. What can I expect after the procedure? After the procedure, it is common to have:  Some soreness or pain in your abdomen and urethra.  Urinary symptoms. These include: ? Mild pain or burning when you urinate. Pain should stop within a few minutes after you urinate. This   may last for up to 1 week. ? A small amount of blood in your urine for several days. ? Feeling like you need to urinate but producing only a small amount of urine. Follow these instructions at home: Medicines  Take over-the-counter and prescription medicines only as told by your health care  provider.  If you were prescribed an antibiotic medicine, take it as told by your health care provider. Do not stop taking the antibiotic even if you start to feel better. General instructions  Return to your normal activities as told by your health care provider. Ask your health care provider what activities are safe for you.  Do not drive for 24 hours if you were given a sedative during your procedure.  Watch for any blood in your urine. If the amount of blood in your urine increases, call your health care provider.  Follow instructions from your health care provider about eating or drinking restrictions.  If a tissue sample was removed for testing (biopsy) during your procedure, it is up to you to get your test results. Ask your health care provider, or the department that is doing the test, when your results will be ready.  Drink enough fluid to keep your urine pale yellow.  Keep all follow-up visits as told by your health care provider. This is important. Contact a health care provider if you:  Have pain that gets worse or does not get better with medicine, especially pain when you urinate.  Have trouble urinating.  Have more blood in your urine. Get help right away if you:  Have blood clots in your urine.  Have abdominal pain.  Have a fever or chills.  Are unable to urinate. Summary  Cystoscopy is a procedure that is used to help diagnose and sometimes treat conditions that affect the lower urinary tract.  Cystoscopy is done using a thin, tube-shaped instrument with a light and camera at the end.  After the procedure, it is common to have some soreness or pain in your abdomen and urethra.  Watch for any blood in your urine. If the amount of blood in your urine increases, call your health care provider.  If you were prescribed an antibiotic medicine, take it as told by your health care provider. Do not stop taking the antibiotic even if you start to feel better. This  information is not intended to replace advice given to you by your health care provider. Make sure you discuss any questions you have with your health care provider. Document Released: 08/15/2000 Document Revised: 08/10/2018 Document Reviewed: 08/10/2018 Elsevier Patient Education  2020 [employer]  

## 2019-04-14 NOTE — Progress Notes (Signed)
04/14/19 1:33 PM   Audrey Hayden [birth year] [id number]  Referring provider: Glean Hess, MD [address] [place],  [place] [address]  CC: Gross hematuria  HPI: I saw Audrey Hayden today in urology clinic in consultation for gross hematuria from Audrey Hayden.  She is a relatively healthy 68 year old female who is had a few months of indigestion and abdominal bloating, as well as a few episodes of gross hematuria with small clots in June 2020.  Urinalysis at this time showed microscopic hematuria, but urine culture showed no growth.  She denies any history of recurrent urinary tract infections.  She denies any flank pain or dysuria.  She has a history of more than 5 kidney stones passed spontaneously, and the last one was over 20 years ago.  There are no aggravating or alleviating factors.  Severity is moderate.  She denies any family history of urologic malignancies.  She has a 6-pack-year smoking history and quit at age 37.  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  She denies any other carcinogenic exposures.   PMH: Past Medical History:  Diagnosis Date  . Allergy   . Osteoporosis     Surgical History: Past Surgical History:  Procedure Laterality Date  . BREAST BIOPSY Left    neg  . TONSILLECTOMY      Allergies:  Allergies  Allergen Reactions  . Amoxicillin Other (See Comments)    Tingling around the mouth  . Cefdinir     Other reaction(s): Other (See Comments) Bright red face and ankle swelling: possible reaction to the medicine.   . Clarithromycin Other (See Comments)    Causes her to faint.  . Codeine Other (See Comments)    Causes her GI upset.  Marland Kitchen Hydromorphone Hcl Other (See Comments)  . Meperidine Other (See Comments)  . Moxifloxacin Other (See Comments)    Dizziness   . Nsaids     Other reaction(s): Unknown  . Omeprazole Other (See Comments)    Ears ringing with more than 20mg /day  . Other  Other (See Comments)    All profins cause her to faint. All the mycins cause her to faint.  . Prednisone Other (See Comments)    Bright red face and ankle swelling: possible reaction to the medicine.  . Pseudoephedrine Hcl     Other reaction(s): Syncope  . Ranitidine     Other reaction(s): Other (See Comments) SE noted with high dosage Dk urine and stools, HA  . Salicylates   . Aspirin Hives and Rash  . Doxycycline Other (See Comments)    Headache. "Pressure on the brain''  . Montelukast Sodium Palpitations    Family History: Family History  Problem Relation Age of Onset  . Hypertension Mother   . CAD Father   . Heart attack Father   . Breast cancer Neg Hx     Social History:  reports that she quit smoking about 44 years ago. Her smoking use included cigarettes. She has a 4.50 pack-year smoking history. She has never used smokeless tobacco. She reports that she does not drink alcohol or use drugs.  ROS: Please see flowsheet from today's date for complete review of systems.  Physical Exam: BP (!) 143/73   Pulse 85   Ht 5\' 8"  (1.727 m)   Wt 123 lb (55.8 kg)   BMI 18.70 kg/m    Constitutional:  Alert and oriented, No acute distress. Cardiovascular: No clubbing, cyanosis, or  edema. Respiratory: Normal respiratory effort, no increased work of breathing. GI: Abdomen is soft, nontender, mildly distended, no abdominal masses GU: No CVA tenderness Lymph: No cervical or inguinal lymphadenopathy. Skin: No rashes, bruises or suspicious lesions. Neurologic: Grossly intact, no focal deficits, moving all 4 extremities. Psychiatric: Normal mood and affect.  Laboratory Data: Reviewed  Urinalysis today 0-5 WBCs, 3-10 RBCs, no bacteria, no yeast, nitrite negative  Pertinent Imaging: None to review  Assessment & Plan:   In summary, Audrey Hayden is a healthy 68 year old female with a few months of abdominal bloating and indigestion, multiple episodes of gross hematuria with small  clots, no significant urinary symptoms, and persistent microscopic hematuria today.  We discussed common possible etiologies of hematuria including malignancy, urolithiasis, medical renal disease, and idiopathic. Standard workup recommended by the AUA includes imaging with CT urogram to assess the upper tracts, and cystoscopy. Cytology is performed on patient's with gross hematuria to look for malignant cells in the urine.  CT urogram and cystoscopy for hematuria work-up  Audrey Co, MD  [facility] [address], [place], [place] [address] [phone]

## 2019-04-15 ENCOUNTER — Ambulatory Visit (INDEPENDENT_AMBULATORY_CARE_PROVIDER_SITE_OTHER): Payer: Medicare Other | Admitting: Internal Medicine

## 2019-04-15 ENCOUNTER — Encounter: Payer: Self-pay | Admitting: Internal Medicine

## 2019-04-15 VITALS — BP 104/62 | HR 72 | Ht 68.0 in | Wt 123.0 lb

## 2019-04-15 DIAGNOSIS — Z1211 Encounter for screening for malignant neoplasm of colon: Secondary | ICD-10-CM | POA: Diagnosis not present

## 2019-04-15 DIAGNOSIS — K439 Ventral hernia without obstruction or gangrene: Secondary | ICD-10-CM | POA: Diagnosis not present

## 2019-04-15 DIAGNOSIS — R140 Abdominal distension (gaseous): Secondary | ICD-10-CM | POA: Diagnosis not present

## 2019-04-15 NOTE — Progress Notes (Signed)
Date:  04/15/2019   Name:  Audrey Hayden   DOB:  12-15-1950   MRN:  [id number]   Chief Complaint: Orland Mustard In Stomach (Patient concerned of hernia on L) side. No pain. Normal BMs. When drinking a lot of water or eat any food her stomach buldges out. Started at the end of MAY. )  Abdominal Pain This is a new problem. The current episode started more than 1 month ago. The onset quality is sudden. The problem occurs daily. The problem has been unchanged. The pain is located in the LLQ. The pain is mild. The quality of the pain is a sensation of fullness. The abdominal pain does not radiate. Pertinent negatives include no belching, constipation, diarrhea, fever, flatus, headaches, hematochezia, melena, nausea or vomiting. The pain is aggravated by coughing (and increased abdominal pressure). The pain is relieved by recumbency. She has tried nothing for the symptoms.   Abdominal bloating - she has been seeing [facility].  She has had several studies and tried on several medications.  Dr. Vira Agar is on leave and she has been dealing with the PA.  She would like a referral to another GI - Dr. Watt Climes in [place] because it is time for another colonoscopy.  Review of Systems  Constitutional: Negative for chills, fatigue, fever and unexpected weight change.  Respiratory: Negative for chest tightness, shortness of breath and wheezing.   Cardiovascular: Negative for chest pain, palpitations and leg swelling.  Gastrointestinal: Positive for abdominal pain. Negative for constipation, diarrhea, flatus, hematochezia, melena, nausea and vomiting.  Neurological: Negative for dizziness and headaches.    Patient Active Problem List   Diagnosis Date Noted  . Asymptomatic spider veins of both lower extremities 02/25/2019  . Gross hematuria 02/25/2019  . Chronic left-sided thoracic back pain 12/09/2017  . Anxiety 12/09/2017  . Environmental and seasonal allergies 06/19/2015  . Varicose veins of both legs with  edema 06/19/2015  . Osteoporosis 06/19/2015  . Abnormal Pap smear of cervix 06/19/2015  . Elevated HDL 06/19/2015  . History of gastritis 11/08/2014  . Cough, persistent 08/14/2014    Allergies  Allergen Reactions  . Amoxicillin Other (See Comments)    Tingling around the mouth  . Cefdinir     Other reaction(s): Other (See Comments) Bright red face and ankle swelling: possible reaction to the medicine.   . Clarithromycin Other (See Comments)    Causes her to faint.  . Codeine Other (See Comments)    Causes her GI upset.  Marland Kitchen Hydromorphone Hcl Other (See Comments)  . Meperidine Other (See Comments)  . Moxifloxacin Other (See Comments)    Dizziness   . Nsaids     Other reaction(s): Unknown  . Omeprazole Other (See Comments)    Ears ringing with more than 20mg /day  . Other Other (See Comments)    All profins cause her to faint. All the mycins cause her to faint.  . Prednisone Other (See Comments)    Bright red face and ankle swelling: possible reaction to the medicine.  . Pseudoephedrine Hcl     Other reaction(s): Syncope  . Ranitidine     Other reaction(s): Other (See Comments) SE noted with high dosage Dk urine and stools, HA  . Salicylates   . Aspirin Hives and Rash  . Doxycycline Other (See Comments)    Headache. "Pressure on the brain''  . Montelukast Sodium Palpitations    Past Surgical History:  Procedure Laterality Date  . BREAST BIOPSY Left  neg  . TONSILLECTOMY      Social History   Tobacco Use  . Smoking status: Former Smoker    Packs/day: 1.50    Years: 3.00    Pack years: 4.50    Types: Cigarettes    Quit date: 04/02/1975    Years since quitting: 44.0  . Smokeless tobacco: Never Used  . Tobacco comment: smoking cessation materials not required  Substance Use Topics  . Alcohol use: No    Alcohol/week: 0.0 standard drinks  . Drug use: No     Medication list has been reviewed and updated.  Current Meds  Medication Sig  . b complex  vitamins tablet Take 1 tablet by mouth 2 (two) times a day.  . Calcium-Phosphorus-Vitamin D (CITRACAL +D3 PO) Take 2 tablets by mouth 2 (two) times daily.  . cetirizine (ZYRTEC) 5 MG tablet Take 5 mg by mouth daily.  . Magnesium 250 MG TABS Take 1 tablet by mouth daily.  . MULTIPLE VITAMINS PO Take 1 tablet by mouth 2 (two) times daily.  Marland Kitchen PANCREATIN PO Take 1,300 mg by mouth 3 (three) times daily.   . Quercetin 250 MG TABS Take 500 mg by mouth 2 (two) times daily.  . sucralfate (CARAFATE) 1 g tablet Take 1 g by mouth 4 (four) times daily -  with meals and at bedtime.    PHQ 2/9 Scores 04/15/2019 02/25/2019 04/26/2018 04/20/2017  PHQ - 2 Score 0 0 0 0  PHQ- 9 Score - - 0 -    BP Readings from Last 3 Encounters:  04/15/19 104/62  04/14/19 (!) 143/73  02/25/19 112/86    Physical Exam Vitals signs and nursing note reviewed.  Constitutional:      General: She is not in acute distress.    Appearance: Normal appearance. She is well-developed.  HENT:     Head: Normocephalic and atraumatic.  Neck:     Musculoskeletal: Normal range of motion.  Cardiovascular:     Rate and Rhythm: Normal rate and regular rhythm.     Pulses: Normal pulses.  Pulmonary:     Effort: Pulmonary effort is normal. No respiratory distress.     Breath sounds: No wheezing or rhonchi.  Abdominal:     General: Abdomen is flat. Bowel sounds are normal.     Palpations: Abdomen is soft.     Tenderness: There is abdominal tenderness in the left lower quadrant.    Musculoskeletal: Normal range of motion.  Lymphadenopathy:     Cervical: No cervical adenopathy.  Skin:    General: Skin is warm and dry.     Findings: No rash.  Neurological:     Mental Status: She is alert and oriented to person, place, and time.  Psychiatric:        Behavior: Behavior normal.        Thought Content: Thought content normal.     Wt Readings from Last 3 Encounters:  04/15/19 123 lb (55.8 kg)  04/14/19 123 lb (55.8 kg)  02/25/19  125 lb 9.6 oz (57 kg)    BP 104/62   Pulse 72   Ht 5\' 8"  (1.727 m)   Wt 123 lb (55.8 kg)   SpO2 97%   BMI 18.70 kg/m   Assessment and Plan: 1. Hernia of abdominal wall Questionable hernia but pt is anxious so will ask for opinion from Gen Surgery - Ambulatory referral to General Surgery  2. Abdominal bloating Continue current dietary changes, sulcralfate and get second opinion - Ambulatory  referral to Gastroenterology  3. Colon cancer screening Last done 2004 - Ambulatory referral to Gastroenterology   Partially dictated using Dragon software. Any errors are unintentional.  Halina Maidens, MD [facility]  04/15/2019

## 2019-04-19 DIAGNOSIS — R1084 Generalized abdominal pain: Secondary | ICD-10-CM | POA: Diagnosis not present

## 2019-04-19 DIAGNOSIS — K210 Gastro-esophageal reflux disease with esophagitis: Secondary | ICD-10-CM | POA: Diagnosis not present

## 2019-04-19 DIAGNOSIS — K30 Functional dyspepsia: Secondary | ICD-10-CM | POA: Diagnosis not present

## 2019-04-19 DIAGNOSIS — R634 Abnormal weight loss: Secondary | ICD-10-CM | POA: Diagnosis not present

## 2019-04-19 DIAGNOSIS — R140 Abdominal distension (gaseous): Secondary | ICD-10-CM | POA: Diagnosis not present

## 2019-04-19 DIAGNOSIS — R05 Cough: Secondary | ICD-10-CM | POA: Diagnosis not present

## 2019-04-19 DIAGNOSIS — K2950 Unspecified chronic gastritis without bleeding: Secondary | ICD-10-CM | POA: Diagnosis not present

## 2019-04-28 ENCOUNTER — Ambulatory Visit (INDEPENDENT_AMBULATORY_CARE_PROVIDER_SITE_OTHER): Payer: Medicare Other | Admitting: General Surgery

## 2019-04-28 ENCOUNTER — Other Ambulatory Visit: Payer: Self-pay

## 2019-04-28 ENCOUNTER — Encounter: Payer: Self-pay | Admitting: General Surgery

## 2019-04-28 VITALS — BP 130/79 | HR 83 | Temp 97.7°F | Ht 68.5 in | Wt 124.0 lb

## 2019-04-28 DIAGNOSIS — R140 Abdominal distension (gaseous): Secondary | ICD-10-CM

## 2019-04-28 NOTE — Patient Instructions (Addendum)
The patient is aware to call back for any questions or new concerns.  Please keep your appointment for the CT scan 05-02-19

## 2019-04-28 NOTE — Progress Notes (Signed)
Patient ID: Audrey Hayden, female   DOB: [birth year], 68 y.o.   MRN: [id number]  Chief Complaint  Patient presents with  . New Patient (Initial Visit)    new pt ref Dr.Berglund Abdominal wall hernia     HPI Audrey Hayden is a 68 y.o. female. Her medical history is notable for a number of gastrointestinal concerns, including gastroesophageal reflux disease (manifest by significant regurgitation and waterbrash), irritable bowel syndrome, grade A esophagitis, as well as unexplained weight loss.  She was referred by Dr. Army Melia for evaluation of swelling in her left lower quadrant to determine whether or not a hernia might be present.  Ms. [name] states that she notices swelling in her abdomen with every meal, including when she just drinks water.  This has been off and on since May of this year.  She denies any pain associated with the swelling.  No nausea or vomiting.  She has a daily bowel movement and denies constipation.  She has been followed in the [facility] gastroenterology office has had 2 upper endoscopies.  The most recent of these showed gastritis and esophagitis.  She has undergone a gastric emptying study that was normal.  She also saw OB/GYN for a feeling of bladder pressure and fullness.  No significant findings were identified at that visit.  She saw urology for hematuria, as she does have a history of kidney stones.  She is scheduled to undergo a CT scan on Monday for further evaluation of hematuria.  She has never had any abdominal surgery.  Based upon Dr. Gaspar Cola notes, it sounds like the patient primarily requires reassurance that there is no hernia present.   Past Medical History:  Diagnosis Date  . Allergy   . GERD (gastroesophageal reflux disease)   . IBS (irritable bowel syndrome)   . Osteoporosis     Past Surgical History:  Procedure Laterality Date  . BREAST BIOPSY Left    neg  . COLONOSCOPY  2004  . TONSILLECTOMY    . UPPER GI ENDOSCOPY  12/2018     Family History  Problem Relation Age of Onset  . Hypertension Mother   . CAD Father   . Heart attack Father   . Breast cancer Neg Hx   . Colon cancer Neg Hx     Social History Social History   Tobacco Use  . Smoking status: Former Smoker    Packs/day: 1.50    Years: 3.00    Pack years: 4.50    Types: Cigarettes    Quit date: 04/02/1975    Years since quitting: 44.1  . Smokeless tobacco: Never Used  . Tobacco comment: smoking cessation materials not required  Substance Use Topics  . Alcohol use: No    Alcohol/week: 0.0 standard drinks  . Drug use: No    Allergies  Allergen Reactions  . Amoxicillin Other (See Comments)    Tingling around the mouth  . Cefdinir     Other reaction(s): Other (See Comments) Bright red face and ankle swelling: possible reaction to the medicine.   . Clarithromycin Other (See Comments)    Causes her to faint.  . Codeine Other (See Comments)    Causes her GI upset.  Marland Kitchen Hydromorphone Hcl Other (See Comments)  . Meperidine Other (See Comments)  . Moxifloxacin Other (See Comments)    Dizziness   . Nsaids     Other reaction(s): Unknown  . Omeprazole Other (See Comments)    Ears ringing with more than 20mg /day  .  Other Other (See Comments)    All profins cause her to faint. All the mycins cause her to faint.  . Prednisone Other (See Comments)    Bright red face and ankle swelling: possible reaction to the medicine.  . Pseudoephedrine Hcl     Other reaction(s): Syncope  . Ranitidine     Other reaction(s): Other (See Comments) SE noted with high dosage Dk urine and stools, HA  . Salicylates   . Aspirin Hives and Rash  . Doxycycline Other (See Comments)    Headache. "Pressure on the brain''  . Montelukast Sodium Palpitations    Current Outpatient Medications  Medication Sig Dispense Refill  . b complex vitamins tablet Take 1 tablet by mouth 2 (two) times a day.    . Calcium-Phosphorus-Vitamin D (CITRACAL +D3 PO) Take 2 tablets by  mouth 2 (two) times daily.    . cetirizine (ZYRTEC) 5 MG tablet Take 5 mg by mouth daily.    . Magnesium 250 MG TABS Take 1 tablet by mouth daily.    . MULTIPLE VITAMINS PO Take 1 tablet by mouth 2 (two) times daily.    Marland Kitchen PANCREATIN PO Take 1,300 mg by mouth 3 (three) times daily.     . Quercetin 250 MG TABS Take 500 mg by mouth 2 (two) times daily.     No current facility-administered medications for this visit.     Review of Systems Review of Systems  Constitutional: Positive for unexpected weight change.  Gastrointestinal: Positive for abdominal distention.  Psychiatric/Behavioral: The patient is nervous/anxious.   All other systems reviewed and are negative.   Blood pressure 130/79, pulse 83, temperature 97.7 F (36.5 C), temperature source Temporal, height 5' 8.5" (1.74 m), weight 124 lb (56.2 kg), SpO2 95 %.  Physical Exam Physical Exam Vitals signs reviewed.  Constitutional:      Appearance: Normal appearance.     Comments: She is very thin, with a BMI of 18.5.  HENT:     Head: Normocephalic and atraumatic.     Nose:     Comments: Covered with a mask secondary to COVID-19 precautions    Mouth/Throat:     Comments: Covered with a mask secondary to COVID-19 precautions Eyes:     General: No scleral icterus.       Right eye: No discharge.        Left eye: No discharge.     Conjunctiva/sclera: Conjunctivae normal.  Neck:     Musculoskeletal: Normal range of motion.     Comments: No thyromegaly or dominant thyroid masses appreciated Cardiovascular:     Rate and Rhythm: Normal rate and regular rhythm.     Pulses: Normal pulses.     Heart sounds: Normal heart sounds. No murmur.  Pulmonary:     Effort: Pulmonary effort is normal.     Breath sounds: Normal breath sounds.  Abdominal:     General: Abdomen is flat. Bowel sounds are normal.     Palpations: Abdomen is soft.     Hernia: No hernia is present.     Comments: The patient actually began coughing heavily while I  examined her abdomen.  No fascial defect was identified in any part of the abdominal wall.  Genitourinary:    Comments: Deferred Musculoskeletal:        General: No deformity.     Right lower leg: No edema.     Left lower leg: No edema.  Lymphadenopathy:     Cervical: No cervical adenopathy.  Skin:  General: Skin is warm and dry.  Neurological:     General: No focal deficit present.     Mental Status: She is alert and oriented to person, place, and time.  Psychiatric:        Mood and Affect: Mood normal.        Behavior: Behavior normal.     Data Reviewed I reviewed the gastroenterology notes from the [facility].  From the December 18 note, the nurse practitioner states that Dr. Vira Agar noted perhaps beginning of a hiatal hernia seen on EGD in 2016.  She also canceled her scheduled colonoscopy that was supposed to be in April 2019.  The most recent office note is dated February 10, 2019.  Multiple complaints were addressed at that visit including reflux, bloating, chronic cough, indigestion, weight loss, loose stools.  The gastric emptying study was ordered at that time.  Those results were also reviewed.  She has normal gastric emptying based upon this study.    The OB/GYN office note from 29 June was reviewed.  No etiology for her bladder pressure sensation was identified.  She apparently had a transvaginal ultrasound which I do not see the documentation, but Dr. Migdalia Dk note does state that this was normal.  She has been seen in endocrinology, by Dr. Gabriel Carina, due to her weight loss and palpitations.  Thyroid function tests were normal and at the time of their initial consultation in 2019, the symptoms had resolved and no further follow-up was planned.  She was seen again on March 08 2019 due to concern about weight loss and palpitations.  Repeat labs were normal and Dr. Gabriel Carina felt that her weight loss was likely secondary to her eating habits.   Assessment and plan: This is a  68 year old woman with numerous gastrointestinal complaints and concerns.  There seems to be a strong component of anxiety to her health complaints.  No hernia was appreciated on physical examination today.  She is scheduled to have a CT scan on Monday of next week.  Certainly, if anything is identified on that study that would require general surgical intervention, I would be more than happy to provide care.  At this time, however, it does not appear that she has any general surgical issues.  I will see her on an as-needed basis.  Greater than 50% of our 45-minute visit was utilized in counseling and coordination of care.    Fredirick Maudlin 04/28/2019, 5:04 PM

## 2019-05-02 ENCOUNTER — Ambulatory Visit
Admission: RE | Admit: 2019-05-02 | Discharge: 2019-05-02 | Disposition: A | Discharge status: Home / Self Care | Payer: Medicare Other | Source: Ambulatory Visit | Attending: Urology | Admitting: Urology

## 2019-05-02 ENCOUNTER — Other Ambulatory Visit: Payer: Self-pay

## 2019-05-02 DIAGNOSIS — R310 Gross hematuria: Secondary | ICD-10-CM | POA: Diagnosis not present

## 2019-05-02 DIAGNOSIS — N200 Calculus of kidney: Secondary | ICD-10-CM | POA: Diagnosis not present

## 2019-05-02 LAB — POCT I-STAT CREATININE: Creatinine, Ser: 1 mg/dL (ref 0.44–1.00)

## 2019-05-02 MED ORDER — IOHEXOL 300 MG/ML  SOLN
100.0000 mL | Freq: Once | INTRAMUSCULAR | Status: AC | PRN
  Administered 2019-05-02: 100 mL via INTRAVENOUS

## 2019-05-11 ENCOUNTER — Other Ambulatory Visit: Payer: Medicare Other | Admitting: Urology

## 2019-05-18 ENCOUNTER — Telehealth: Payer: Self-pay | Admitting: Urology

## 2019-05-18 NOTE — Telephone Encounter (Signed)
Will call her this week with CT results, thanks  Nickolas Madrid, MD 05/18/2019

## 2019-05-18 NOTE — Telephone Encounter (Signed)
Pt cx'd cysto appt, states she does not want to have Cysto. But she would like to have her CT results. Also states she's doing much better. Please advise.

## 2019-05-19 DIAGNOSIS — K210 Gastro-esophageal reflux disease with esophagitis: Secondary | ICD-10-CM | POA: Diagnosis not present

## 2019-05-19 DIAGNOSIS — Z1211 Encounter for screening for malignant neoplasm of colon: Secondary | ICD-10-CM | POA: Diagnosis not present

## 2019-05-19 DIAGNOSIS — R05 Cough: Secondary | ICD-10-CM | POA: Diagnosis not present

## 2019-05-20 NOTE — Telephone Encounter (Signed)
-----   Message from Billey Co, MD sent at 05/19/2019  6:49 PM EDT ----- Regarding: CT results Patient with gross hematuria, she apparently canceled her cysto but wants CT results.  CT shows no sign of any urologic cancers, however she has some small stones in the left kidney that are not causing any blockage.  With her gross hematuria, I would still recommend following through with cysto to rule out any bladder cancer, as CT does not look inside the bladder.  If she refuses, please schedule for one year follow up with [facility] for stone surveillance.  Thanks Nickolas Madrid, MD 05/19/2019

## 2019-05-20 NOTE — Telephone Encounter (Signed)
Spoke to patient and gave the CT results. Patient states she not going to do the Cysto and she will call to schedule a 1 year follow up.

## 2019-05-23 ENCOUNTER — Other Ambulatory Visit: Payer: Medicare Other | Admitting: Urology

## 2019-07-06 ENCOUNTER — Ambulatory Visit (INDEPENDENT_AMBULATORY_CARE_PROVIDER_SITE_OTHER): Payer: Medicare Other

## 2019-07-06 VITALS — BP 118/76 | HR 70 | Temp 97.1°F | Ht 68.0 in | Wt 125.0 lb

## 2019-07-06 DIAGNOSIS — Z1231 Encounter for screening mammogram for malignant neoplasm of breast: Secondary | ICD-10-CM

## 2019-07-06 DIAGNOSIS — Z0000 Encounter for general adult medical examination without abnormal findings: Secondary | ICD-10-CM

## 2019-07-06 DIAGNOSIS — Z780 Asymptomatic menopausal state: Secondary | ICD-10-CM | POA: Diagnosis not present

## 2019-07-06 NOTE — Progress Notes (Signed)
Subjective:   Audrey Hayden is a 68 y.o. female who presents for Medicare Annual (Subsequent) preventive examination.  Virtual Visit via Telephone Note  I connected with Audrey Hayden on 07/06/19 at 11:20 AM EST by telephone and verified that I am speaking with the correct person using two identifiers.  Medicare Annual Wellness visit completed telephonically due to Covid-19 pandemic.   Location: Patient: home Provider: office   I discussed the limitations, risks, security and privacy concerns of performing an evaluation and management service by telephone and the availability of in person appointments. The patient expressed understanding and agreed to proceed.  Some vital signs may be absent or patient reported.   Audrey Marker, Hayden    Review of Systems:   Cardiac Risk Factors include: advanced age (>36men, >49 women)     Objective:     Vitals: BP 118/76   Pulse 70   Temp (!) 97.1 F (36.2 C)   Ht 5\' 8"  (1.727 m)   Wt 125 lb (56.7 kg)   SpO2 98%   BMI 19.01 kg/m   Body mass index is 19.01 kg/m.  Advanced Directives 07/06/2019 04/26/2018 11/13/2017 09/14/2017 04/20/2017 11/14/2015  Does Patient Have a Medical Advance Directive? Yes No Yes No Yes Yes  Type of Paramedic of [place];Living will - Living will;Healthcare Power of [place];Living will Living will;Healthcare Power of [place] in Chart? No - copy requested - - - No - copy requested -  Would patient like information on creating a medical advance directive? - Yes (MAU/Ambulatory/Procedural Areas - Information given) - - - -    Tobacco Social History   Tobacco Use  Smoking Status Former Smoker  . Packs/day: 1.50  . Years: 3.00  . Pack years: 4.50  . Types: Cigarettes  . Quit date: 04/02/1975  . Years since quitting: 44.2  Smokeless Tobacco Never Used  Tobacco Comment   smoking cessation materials not  required     Counseling given: Not Answered Comment: smoking cessation materials not required   Clinical Intake:  Pre-visit preparation completed: Yes  Pain : No/denies pain     BMI - recorded: 19.01 Nutritional Status: BMI <19  Underweight Nutritional Risks: None Diabetes: No  How often do you need to have someone help you when you read instructions, pamphlets, or other written materials from your doctor or pharmacy?: 1 - Never  Interpreter Needed?: No  Information entered by :: Audrey Hayden  Past Medical History:  Diagnosis Date  . Allergy   . GERD (gastroesophageal reflux disease)   . IBS (irritable bowel syndrome)   . Osteoporosis    Past Surgical History:  Procedure Laterality Date  . BREAST BIOPSY Left    neg  . COLONOSCOPY  2004  . TONSILLECTOMY    . UPPER GI ENDOSCOPY  12/2018   Family History  Problem Relation Age of Onset  . Hypertension Mother   . CAD Father   . Heart attack Father   . Breast cancer Neg Hx   . Colon cancer Neg Hx    Social History   Socioeconomic History  . Marital status: Married    Spouse name: Not on file  . Number of children: 2  . Years of education: Not on file  . Highest education level: Bachelor's degree (e.g., BA, AB, BS)  Occupational History  . Occupation: Retired  Scientific laboratory technician  . Financial resource strain: Not hard  at all  . Food insecurity    Worry: Never true    Inability: Never true  . Transportation needs    Medical: No    Non-medical: No  Tobacco Use  . Smoking status: Former Smoker    Packs/day: 1.50    Years: 3.00    Pack years: 4.50    Types: Cigarettes    Quit date: 04/02/1975    Years since quitting: 44.2  . Smokeless tobacco: Never Used  . Tobacco comment: smoking cessation materials not required  Substance and Sexual Activity  . Alcohol use: No    Alcohol/week: 0.0 standard drinks  . Drug use: No  . Sexual activity: Not Currently  Lifestyle  . Physical activity    Days per week: 7  days    Minutes per session: 30 min  . Stress: Only a little  Relationships  . Social Herbalist on phone: Patient refused    Gets together: Patient refused    Attends religious service: Patient refused    Active member of club or organization: Patient refused    Attends meetings of clubs or organizations: Patient refused    Relationship status: Married  Other Topics Concern  . Not on file  Social History Narrative  . Not on file    Outpatient Encounter Medications as of 07/06/2019  Medication Sig  . b complex vitamins tablet Take 1 tablet by mouth 2 (two) times a day.  . Calcium-Phosphorus-Vitamin D (CITRACAL +D3 PO) Take 2 tablets by mouth 2 (two) times daily.  . cetirizine (ZYRTEC) 5 MG tablet Take 5 mg by mouth daily.  Marland Kitchen DEXILANT 60 MG capsule Take 1 capsule by mouth daily.  . Magnesium 250 MG TABS Take 1 tablet by mouth daily.  . MULTIPLE VITAMINS PO Take 1 tablet by mouth 2 (two) times daily.  Marland Kitchen PANCREATIN PO Take 1,300 mg by mouth 3 (three) times daily.   . Quercetin 250 MG TABS Take 500 mg by mouth 2 (two) times daily.   No facility-administered encounter medications on file as of 07/06/2019.     Activities of Daily Living In your present state of health, do you have any difficulty performing the following activities: 07/06/2019  Hearing? N  Comment pt declines hearing aids  Vision? N  Difficulty concentrating or making decisions? N  Walking or climbing stairs? N  Dressing or bathing? N  Doing errands, shopping? N  Preparing Food and eating ? N  Using the Toilet? N  In the past six months, have you accidently leaked urine? N  Do you have problems with loss of bowel control? N  Managing your Medications? N  Managing your Finances? N  Housekeeping or managing your Housekeeping? N  Some recent data might be hidden    Patient Care Team: Glean Hess, MD as PCP - General (Family Medicine) Benjaman Kindler, MD as Consulting Physician (Obstetrics and  Gynecology)    Assessment:   This is a routine wellness examination for Audrey Hayden.  Exercise Activities and Dietary recommendations Current Exercise Habits: Home exercise routine, Type of exercise: walking, Time (Minutes): 30, Frequency (Times/Week): 7, Weekly Exercise (Minutes/Week): 210, Intensity: Moderate, Exercise limited by: None identified  Goals    . DIET - INCREASE LEAN PROTEINS     Recommend to drink 1-2 protein shakes per day    . DIET - INCREASE WATER INTAKE     Recommend to drink at least 6-8 8oz glasses of water per day.  Fall Risk Fall Risk  07/06/2019 04/28/2019 02/25/2019 04/26/2018 04/20/2017  Falls in the past year? 0 0 1 Yes No  Comment - - - wave knocker her down -  Number falls in past yr: 0 - 1 1 -  Injury with Fall? 0 - 0 No -  Risk for fall due to : - - History of fall(s);Impaired balance/gait Impaired vision -  Risk for fall due to: Comment - - - wears eyeglasses -  Follow up Falls prevention discussed Falls evaluation completed Falls evaluation completed Falls evaluation completed;Education provided;Falls prevention discussed -   FALL RISK PREVENTION PERTAINING TO THE HOME:  Any stairs in or around the home? No  If so, do they handrails? No   Home free of loose throw rugs in walkways, pet beds, electrical cords, etc? Yes Adequate lighting in your home to reduce risk of falls? Yes  ASSISTIVE DEVICES UTILIZED TO PREVENT FALLS:  Life alert? No  Use of a cane, walker or w/c? No  Grab bars in the bathroom? Yes  Shower chair or bench in shower? Yes  Elevated toilet seat or a handicapped toilet? No   DME ORDERS:  DME order needed?  No   TIMED UP AND GO:  Was the test performed? No . Telephonic visit.   Education: Fall risk prevention has been discussed.  Intervention(s) required? No   Depression Screen PHQ 2/9 Scores 07/06/2019 04/15/2019 02/25/2019 04/26/2018  PHQ - 2 Score 0 0 0 0  PHQ- 9 Score - - - 0     Cognitive Function     6CIT  Screen 04/26/2018 04/20/2017  What Year? 0 points 0 points  What month? 0 points 0 points  What time? 0 points 0 points  Count back from 20 0 points 0 points  Months in reverse 0 points 0 points  Repeat phrase 0 points 0 points  Total Score 0 0    Immunization History  Administered Date(s) Administered  . Influenza-Unspecified 07/06/2015  . Pneumococcal Conjugate-13 11/09/2017  . Tdap 10/27/2018    Qualifies for Shingles Vaccine? Yes  . Due for Shingrix. Education has been provided regarding the importance of this vaccine. Pt has been advised to call insurance company to determine out of pocket expense. Advised may also receive vaccine at local pharmacy or Health Dept. Verbalized acceptance and understanding.  Tdap: Up to date  Flu Vaccine: Due for Flu vaccine. Does the patient want to receive this vaccine today?  No . Education has been provided regarding the importance of this vaccine but still declined. Advised may receive this vaccine at local pharmacy or Health Dept. Aware to provide a copy of the vaccination record if obtained from local pharmacy or Health Dept. Verbalized acceptance and understanding.  Pneumococcal Vaccine: Due for Pneumococcal vaccine. Does the patient want to receive this vaccine today?  No . Education has been provided regarding the importance of this vaccine but still declined. Advised may receive this vaccine at local pharmacy or Health Dept. Aware to provide a copy of the vaccination record if obtained from local pharmacy or Health Dept. Verbalized acceptance and understanding.   Screening Tests Health Maintenance  Topic Date Due  . Hepatitis C Screening  [birth year]  . TETANUS/TDAP  07/27/1969  . PNA vac Low Risk Adult (2 of 2 - PPSV23) 11/10/2018  . INFLUENZA VACCINE  04/02/2019  . MAMMOGRAM  07/14/2019  . COLONOSCOPY  10/16/2024  . DEXA SCAN  Completed    Cancer Screenings:  Colorectal Screening: Completed 2004.  Seeing Dr. Watt Climes for GI, waiting to  do colonoscopy until stable on dexilant.   Mammogram: Completed 07/13/18. Repeat every year. Ordered today. Pt provided with contact information and advised to call to schedule appt.   Bone Density: Completed 06/29/17. Results reflect OSTEOPOROSIS. Repeat every 2 years. Ordered today. Pt provided with contact information and advised to call to schedule appt.   Lung Cancer Screening: (Low Dose CT Chest recommended if Age 52-80 years, 30 pack-year currently smoking OR have quit w/in 15years.) does not qualify.   Additional Screening:  Hepatitis C Screening: does qualify; postponed.  Vision Screening: Recommended annual ophthalmology exams for early detection of glaucoma and other disorders of the eye. Is the patient up to date with their annual eye exam?  No  Who is the provider or what is the name of the office in which the pt attends annual eye exams? Dr. Lorie Apley  Dental Screening: Recommended annual dental exams for proper oral hygiene  Community Resource Referral:  CRR required this visit?  No      Plan:    I have personally reviewed and addressed the Medicare Annual Wellness questionnaire and have noted the following in the patient's chart:  A. Medical and social history B. Use of alcohol, tobacco or illicit drugs  C. Current medications and supplements D. Functional ability and status E.  Nutritional status F.  Physical activity G. Advance directives H. List of other physicians I.  Hospitalizations, surgeries, and ER visits in previous 12 months J.  [place] such as hearing and vision if needed, cognitive and depression L. Referrals and appointments   In addition, I have reviewed and discussed with patient certain preventive protocols, quality metrics, and best practice recommendations. A written personalized care plan for preventive services as well as general preventive health recommendations were provided to patient.    Signed,  Audrey Marker, Hayden  Nurse Health Advisor   Nurse Notes: pt doing well and appreciative of visit today.

## 2019-07-06 NOTE — Patient Instructions (Signed)
Audrey Hayden , Thank you for taking time to come for your Medicare Wellness Visit. I appreciate your ongoing commitment to your health goals. Please review the following plan we discussed and let me know if I can assist you in the future.   Screening recommendations/referrals: Colonoscopy: done 2004 Mammogram: done 07/13/18. Please call [phone] to schedule your mammogram and bone density screening. Bone Density: done 06/29/17 Recommended yearly ophthalmology/optometry visit for glaucoma screening and checkup Recommended yearly dental visit for hygiene and checkup  Vaccinations: Influenza vaccine: due Pneumococcal vaccine: due for PPSV23 Tdap vaccine: done 10/27/18 Shingles vaccine: Shingrix discussed. Please contact your pharmacy for coverage information.   Advanced directives: Please bring a copy of your health care power of attorney and living will to the office at your convenience.  Conditions/risks identified: Keep up the great work!  Next appointment: Please follow up in one year for your Medicare Annual Wellness visit.     Preventive Care 99 Years and Older, Female Preventive care refers to lifestyle choices and visits with your health care provider that can promote health and wellness. What does preventive care include?  A yearly physical exam. This is also called an annual well check.  Dental exams once or twice a year.  Routine eye exams. Ask your health care provider how often you should have your eyes checked.  Personal lifestyle choices, including:  Daily care of your teeth and gums.  Regular physical activity.  Eating a healthy diet.  Avoiding tobacco and drug use.  Limiting alcohol use.  Practicing safe sex.  Taking low-dose aspirin every day.  Taking vitamin and mineral supplements as recommended by your health care provider. What happens during an annual well check? The services and screenings done by your health care provider during your annual  well check will depend on your age, overall health, lifestyle risk factors, and family history of disease. Counseling  Your health care provider may ask you questions about your:  Alcohol use.  Tobacco use.  Drug use.  Emotional well-being.  Home and relationship well-being.  Sexual activity.  Eating habits.  History of falls.  Memory and ability to understand (cognition).  Work and work Statistician.  Reproductive health. Screening  You may have the following tests or measurements:  Height, weight, and BMI.  Blood pressure.  Lipid and cholesterol levels. These may be checked every 5 years, or more frequently if you are over 64 years old.  Skin check.  Lung cancer screening. You may have this screening every year starting at age 16 if you have a 30-pack-year history of smoking and currently smoke or have quit within the past 15 years.  Fecal occult blood test (FOBT) of the stool. You may have this test every year starting at age 22.  Flexible sigmoidoscopy or colonoscopy. You may have a sigmoidoscopy every 5 years or a colonoscopy every 10 years starting at age 72.  Hepatitis C blood test.  Hepatitis B blood test.  Sexually transmitted disease (STD) testing.  Diabetes screening. This is done by checking your blood sugar (glucose) after you have not eaten for a while (fasting). You may have this done every 1-3 years.  Bone density scan. This is done to screen for osteoporosis. You may have this done starting at age 78.  Mammogram. This may be done every 1-2 years. Talk to your health care provider about how often you should have regular mammograms. Talk with your health care provider about your test results, treatment options, and if necessary,  the need for more tests. Vaccines  Your health care provider may recommend certain vaccines, such as:  Influenza vaccine. This is recommended every year.  Tetanus, diphtheria, and acellular pertussis (Tdap, Td) vaccine.  You may need a Td booster every 10 years.  Zoster vaccine. You may need this after age 33.  Pneumococcal 13-valent conjugate (PCV13) vaccine. One dose is recommended after age 27.  Pneumococcal polysaccharide (PPSV23) vaccine. One dose is recommended after age 82. Talk to your health care provider about which screenings and vaccines you need and how often you need them. This information is not intended to replace advice given to you by your health care provider. Make sure you discuss any questions you have with your health care provider. Document Released: 09/14/2015 Document Revised: 05/07/2016 Document Reviewed: 06/19/2015 Elsevier Interactive Patient Education  2017 [place] Prevention in the Home Falls can cause injuries. They can happen to people of all ages. There are many things you can do to make your home safe and to help prevent falls. What can I do on the outside of my home?  Regularly fix the edges of walkways and driveways and fix any cracks.  Remove anything that might make you trip as you walk through a door, such as a raised step or threshold.  Trim any bushes or trees on the path to your home.  Use bright outdoor lighting.  Clear any walking paths of anything that might make someone trip, such as rocks or tools.  Regularly check to see if handrails are loose or broken. Make sure that both sides of any steps have handrails.  Any raised decks and porches should have guardrails on the edges.  Have any leaves, snow, or ice cleared regularly.  Use sand or salt on walking paths during winter.  Clean up any spills in your garage right away. This includes oil or grease spills. What can I do in the bathroom?  Use night lights.  Install grab bars by the toilet and in the tub and shower. Do not use towel bars as grab bars.  Use non-skid mats or decals in the tub or shower.  If you need to sit down in the shower, use a plastic, non-slip stool.  Keep the  floor dry. Clean up any water that spills on the floor as soon as it happens.  Remove soap buildup in the tub or shower regularly.  Attach bath mats securely with double-sided non-slip rug tape.  Do not have throw rugs and other things on the floor that can make you trip. What can I do in the bedroom?  Use night lights.  Make sure that you have a light by your bed that is easy to reach.  Do not use any sheets or blankets that are too big for your bed. They should not hang down onto the floor.  Have a firm chair that has side arms. You can use this for support while you get dressed.  Do not have throw rugs and other things on the floor that can make you trip. What can I do in the kitchen?  Clean up any spills right away.  Avoid walking on wet floors.  Keep items that you use a lot in easy-to-reach places.  If you need to reach something above you, use a strong step stool that has a grab bar.  Keep electrical cords out of the way.  Do not use floor polish or wax that makes floors slippery. If you must use  wax, use non-skid floor wax.  Do not have throw rugs and other things on the floor that can make you trip. What can I do with my stairs?  Do not leave any items on the stairs.  Make sure that there are handrails on both sides of the stairs and use them. Fix handrails that are broken or loose. Make sure that handrails are as long as the stairways.  Check any carpeting to make sure that it is firmly attached to the stairs. Fix any carpet that is loose or worn.  Avoid having throw rugs at the top or bottom of the stairs. If you do have throw rugs, attach them to the floor with carpet tape.  Make sure that you have a light switch at the top of the stairs and the bottom of the stairs. If you do not have them, ask someone to add them for you. What else can I do to help prevent falls?  Wear shoes that:  Do not have high heels.  Have rubber bottoms.  Are comfortable and fit  you well.  Are closed at the toe. Do not wear sandals.  If you use a stepladder:  Make sure that it is fully opened. Do not climb a closed stepladder.  Make sure that both sides of the stepladder are locked into place.  Ask someone to hold it for you, if possible.  Clearly mark and make sure that you can see:  Any grab bars or handrails.  First and last steps.  Where the edge of each step is.  Use tools that help you move around (mobility aids) if they are needed. These include:  Canes.  Walkers.  Scooters.  Crutches.  Turn on the lights when you go into a dark area. Replace any light bulbs as soon as they burn out.  Set up your furniture so you have a clear path. Avoid moving your furniture around.  If any of your floors are uneven, fix them.  If there are any pets around you, be aware of where they are.  Review your medicines with your doctor. Some medicines can make you feel dizzy. This can increase your chance of falling. Ask your doctor what other things that you can do to help prevent falls. This information is not intended to replace advice given to you by your health care provider. Make sure you discuss any questions you have with your health care provider. Document Released: 06/14/2009 Document Revised: 01/24/2016 Document Reviewed: 09/22/2014 Elsevier Interactive Patient Education  2017 [employer].

## 2019-07-21 ENCOUNTER — Other Ambulatory Visit: Payer: Self-pay

## 2019-07-21 ENCOUNTER — Ambulatory Visit (LOCAL_COMMUNITY_HEALTH_CENTER): Payer: Self-pay

## 2019-07-21 DIAGNOSIS — Z23 Encounter for immunization: Secondary | ICD-10-CM

## 2019-07-27 ENCOUNTER — Other Ambulatory Visit: Payer: Medicare Other

## 2019-08-08 ENCOUNTER — Ambulatory Visit (INDEPENDENT_AMBULATORY_CARE_PROVIDER_SITE_OTHER): Payer: Medicare Other | Admitting: Otolaryngology

## 2019-08-10 ENCOUNTER — Other Ambulatory Visit: Payer: Self-pay

## 2019-08-10 ENCOUNTER — Ambulatory Visit (INDEPENDENT_AMBULATORY_CARE_PROVIDER_SITE_OTHER): Payer: Medicare Other | Admitting: Otolaryngology

## 2019-08-10 ENCOUNTER — Encounter (INDEPENDENT_AMBULATORY_CARE_PROVIDER_SITE_OTHER): Payer: Self-pay | Admitting: Otolaryngology

## 2019-08-10 VITALS — Temp 97.3°F

## 2019-08-10 DIAGNOSIS — J302 Other seasonal allergic rhinitis: Secondary | ICD-10-CM

## 2019-08-10 MED ORDER — TRIAMCINOLONE ACETONIDE 55 MCG/ACT NA AERO: 2.0000 | INHALATION_SPRAY | Freq: Every day | NASAL | 12 refills | Status: DC

## 2019-08-10 MED ORDER — AZELASTINE HCL 0.1 % NA SOLN: 1.0000 | Freq: Two times a day (BID) | NASAL | Status: DC

## 2019-08-10 NOTE — Progress Notes (Signed)
HPI: Audrey Hayden is a 68 y.o. female who presents is referred by Dr. Watt Climes for evaluation of chronic sinus issues with history of GE reflux disease.  She is presently taking antiacid therapy.  She does complain of chronic postnasal drainage.  She has had a long history of allergies and sinus problems.  She is denied any recent sinus infections.  She used to take allergy shots but presently is only using Zyrtec which seems to help the most of the antihistamines she has tried.  She has been told previously that she has deviated septum as well as possible nasal polyps.  She presents today for nasal examination and recommendations..  Past Medical History:  Diagnosis Date  . Allergy   . GERD (gastroesophageal reflux disease)   . IBS (irritable bowel syndrome)   . Osteoporosis    Past Surgical History:  Procedure Laterality Date  . BREAST BIOPSY Left    neg  . COLONOSCOPY  2004  . TONSILLECTOMY    . UPPER GI ENDOSCOPY  12/2018   Social History   Socioeconomic History  . Marital status: Married    Spouse name: Not on file  . Number of children: 2  . Years of education: Not on file  . Highest education level: Bachelor's degree (e.g., BA, AB, BS)  Occupational History  . Occupation: Retired  Scientific laboratory technician  . Financial resource strain: Not hard at all  . Food insecurity    Worry: Never true    Inability: Never true  . Transportation needs    Medical: No    Non-medical: No  Tobacco Use  . Smoking status: Former Smoker    Packs/day: 1.50    Years: 5.00    Pack years: 7.50    Types: Cigarettes    Start date: 13    Quit date: 04/02/1975    Years since quitting: 44.3  . Smokeless tobacco: Never Used  . Tobacco comment: smoking cessation materials not required  Substance and Sexual Activity  . Alcohol use: No    Alcohol/week: 0.0 standard drinks  . Drug use: No  . Sexual activity: Not Currently  Lifestyle  . Physical activity    Days per week: 7 days    Minutes per  session: 30 min  . Stress: Only a little  Relationships  . Social Herbalist on phone: Patient refused    Gets together: Patient refused    Attends religious service: Patient refused    Active member of club or organization: Patient refused    Attends meetings of clubs or organizations: Patient refused    Relationship status: Married  Other Topics Concern  . Not on file  Social History Narrative  . Not on file   Family History  Problem Relation Age of Onset  . Hypertension Mother   . CAD Father   . Heart attack Father   . Breast cancer Neg Hx   . Colon cancer Neg Hx    Allergies  Allergen Reactions  . Eggs Or Egg-Derived Products Itching and Rash  . Amoxicillin Other (See Comments)    Tingling around the mouth  . Cefdinir     Other reaction(s): Other (See Comments) Bright red face and ankle swelling: possible reaction to the medicine.   . Clarithromycin Other (See Comments)    Causes her to faint.  . Codeine Other (See Comments)    Causes her GI upset.  Marland Kitchen Hydromorphone Hcl Other (See Comments)    Unsure of reaction  .  Meperidine Other (See Comments)    Unsure of reaction  . Moxifloxacin Other (See Comments)    Dizziness   . Nsaids     Other reaction(s): Unknown  . Omeprazole Other (See Comments)    Ears ringing with more than 20mg /day  . Other Other (See Comments)    All profins cause her to faint. All the mycins cause her to faint.  . Prednisone Other (See Comments)    Bright red face and ankle swelling: possible reaction to the medicine.  . Pseudoephedrine Hcl     Other reaction(s): Syncope  . Ranitidine     Other reaction(s): Other (See Comments) SE noted with high dosage Dk urine and stools, HA  . Salicylates Hives  . Aspirin Hives and Rash  . Doxycycline Other (See Comments)    Headache. "Pressure on the brain''  . Montelukast Sodium Palpitations   Prior to Admission medications   Medication Sig Start Date End Date Taking? Authorizing  Provider  b complex vitamins tablet Take 1 tablet by mouth 2 (two) times a day.   Yes [provider]  Calcium-Phosphorus-Vitamin D (CITRACAL +D3 PO) Take 2 tablets by mouth 2 (two) times daily.   Yes [provider]  cetirizine (ZYRTEC) 5 MG tablet Take 5 mg by mouth daily.   Yes [provider]  DEXILANT 60 MG capsule Take 1 capsule by mouth daily. 07/02/19  Yes [provider]  Magnesium 250 MG TABS Take 1 tablet by mouth daily.   Yes [provider]  MULTIPLE VITAMINS PO Take 1 tablet by mouth 2 (two) times daily.   Yes [provider]  PANCREATIN PO Take 1,300 mg by mouth 3 (three) times daily.    Yes [provider]  Quercetin 250 MG TABS Take 500 mg by mouth 2 (two) times daily.   Yes [provider]     Positive ROS: Otherwise negative  All other systems have been reviewed and were otherwise negative with the exception of those mentioned in the HPI and as above.  Physical Exam: Constitutional: Alert, well-appearing, no acute distress Ears: External ears without lesions or tenderness. Ear canals are clear bilaterally with intact, clear TMs.  Nasal: External nose without lesions. Septum with only mild deformity.  No intranasal polyps noted..  Mucus discharge within the nasal cavity is clear.  Both middle meatus regions are clear with no obvious middle meatal polyps or intranasal polyps noted.  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  She has moderate rhinitis. Oral: Lips and gums without lesions. Tongue and palate mucosa without lesions. Posterior oropharynx clear. Indirect laryngoscopy revealed a clear base of tongue vallecula and epiglottis. Neck: No palpable adenopathy or masses Respiratory: Breathing comfortably  Skin: No facial/neck lesions or rash noted.  Procedures  Assessment: Allergic rhinitis  Plan: Would recommend regular use of Zyrtec as she is presently  taking since this seems the most effective of the antihistamines she has tried. Also suggested use of nasal sprays and recommended use of Nasacort 2 sprays each nostril at night and azelastine 1 spray twice daily which should also help with her allergies. She could consider seeing an allergist again to get tested and possibly consider allergy shots. Presently no signs of active sinus infection requiring antibiotic therapy. She will follow-up as needed.   Radene Journey, MD   CC:

## 2019-08-10 NOTE — Addendum Note (Signed)
Addended by: Rozetta Nunnery on: 08/10/2019 09:28 AM   Modules accepted: Orders

## 2019-08-25 ENCOUNTER — Other Ambulatory Visit: Payer: Self-pay

## 2019-08-25 ENCOUNTER — Encounter: Payer: Self-pay | Admitting: Intensive Care

## 2019-08-25 ENCOUNTER — Emergency Department: Payer: Medicare Other

## 2019-08-25 ENCOUNTER — Emergency Department
Admission: EM | Admit: 2019-08-25 | Discharge: 2019-08-25 | Disposition: A | Discharge status: Home / Self Care | Payer: Medicare Other | Attending: Student | Admitting: Student

## 2019-08-25 DIAGNOSIS — Z79899 Other long term (current) drug therapy: Secondary | ICD-10-CM | POA: Diagnosis not present

## 2019-08-25 DIAGNOSIS — Z87891 Personal history of nicotine dependence: Secondary | ICD-10-CM | POA: Insufficient documentation

## 2019-08-25 DIAGNOSIS — R609 Edema, unspecified: Secondary | ICD-10-CM | POA: Insufficient documentation

## 2019-08-25 DIAGNOSIS — R2242 Localized swelling, mass and lump, left lower limb: Secondary | ICD-10-CM | POA: Diagnosis present

## 2019-08-25 NOTE — Discharge Instructions (Signed)
Wear your compression stockings. Keep your leg elevated as much as possible until the swelling goes down.   Follow up with primary care.

## 2019-08-25 NOTE — ED Triage Notes (Signed)
Patient c/o left foot swelling X1 week. Many varicose veins present. Denies injury. Can ambulate but with pain. Has not been able to see PCP yet.

## 2019-08-25 NOTE — ED Provider Notes (Signed)
[facility] Emergency Department Provider Note ____________________________________________   First MD Initiated Contact with Patient 08/25/19 1200     (approximate)  I have reviewed the triage vital signs and the nursing notes.   HISTORY  Chief Complaint Foot Swelling  HPI Audrey Hayden is a 68 y.o. female presents to the emergency department for treatment and evaluation of the left ankle and foot swelling.  Patient states that this is common, but over the past week or so the swelling has been getting worse.  She states that her foot is now very tender as well.  She has a history of varicose veins.  She has no history of DVT.  She is allergic to aspirin and is not on any type of blood thinners at this time.        Past Medical History:  Diagnosis Date  . Allergy   . GERD (gastroesophageal reflux disease)   . IBS (irritable bowel syndrome)   . Osteoporosis     Patient Active Problem List   Diagnosis Date Noted  . Asymptomatic spider veins of both lower extremities 02/25/2019  . Gross hematuria 02/25/2019  . Chronic left-sided thoracic back pain 12/09/2017  . Anxiety 12/09/2017  . Environmental and seasonal allergies 06/19/2015  . 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 Osteoporosis 06/19/2015  . Abnormal Pap smear of cervix 06/19/2015  . Elevated HDL 06/19/2015  . History of gastritis 11/08/2014  . Cough, persistent 08/14/2014    Past Surgical History:  Procedure Laterality Date  . BREAST BIOPSY Left    neg  . COLONOSCOPY  2004  . TONSILLECTOMY    . UPPER GI ENDOSCOPY  12/2018    Prior to Admission medications   Medication Sig Start Date End Date Taking? Authorizing Provider  b complex vitamins tablet Take 1 tablet by mouth 2 (two) times a day.    [provider]  Calcium-Phosphorus-Vitamin D (CITRACAL +D3 PO) Take 2 tablets by mouth 2 (two) times daily.    [provider]  cetirizine (ZYRTEC) 5 MG  tablet Take 5 mg by mouth daily.    [provider]  DEXILANT 60 MG capsule Take 1 capsule by mouth daily. 07/02/19   [provider]  Magnesium 250 MG TABS Take 1 tablet by mouth daily.    [provider]  MULTIPLE VITAMINS PO Take 1 tablet by mouth 2 (two) times daily.    [provider]  PANCREATIN PO Take 1,300 mg by mouth 3 (three) times daily.     [provider]  Quercetin 250 MG TABS Take 500 mg by mouth 2 (two) times daily.    [provider]  triamcinolone (NASACORT) 55 MCG/ACT AERO nasal inhaler Place 2 sprays into the nose daily. 08/10/19   Rozetta Nunnery, MD  triamcinolone (NASACORT) 55 MCG/ACT AERO nasal inhaler Place 2 sprays into the nose daily. 08/10/19   Rozetta Nunnery, MD    Allergies Eggs or egg-derived products, Amoxicillin, Cefdinir, Clarithromycin, Codeine, Hydromorphone hcl, Meperidine, Moxifloxacin, Nsaids, Omeprazole, Other, Prednisone, Pseudoephedrine hcl, Ranitidine, Salicylates, Aspirin, Doxycycline, and Montelukast sodium  Family History  Problem Relation Age of Onset  . Hypertension Mother   . CAD Father   . Heart attack Father   . Breast cancer Neg Hx   . Colon cancer Neg Hx     Social History Social History   Tobacco Use  . Smoking status: Former Smoker    Packs/day: 1.50    Years:  5.00    Pack years: 7.50    Types: Cigarettes    Start date: 44    Quit date: 04/02/1975    Years since quitting: 44.4  . Smokeless tobacco: Never Used  . Tobacco comment: smoking cessation materials not required  Substance Use Topics  . Alcohol use: No    Alcohol/week: 0.0 standard drinks  . Drug use: No    Review of Systems  Constitutional: No fever/chills Eyes: No visual changes. ENT: No sore throat. Cardiovascular: Denies chest pain. Respiratory: Denies shortness of breath. Gastrointestinal: No abdominal pain.  No nausea, no vomiting. Genitourinary: Negative for dysuria. Musculoskeletal:  Positive for left lower extremity pain and swelling Skin: Negative for rash. Neurological: Negative for headaches, focal weakness or numbness. ____________________________________________   PHYSICAL EXAM:  VITAL SIGNS: ED Triage Vitals  Enc Vitals Group     BP 08/25/19 1121 107/81     Pulse Rate 08/25/19 1121 68     Resp 08/25/19 1121 14     Temp 08/25/19 1121 98.4 F (36.9 C)     Temp Source 08/25/19 1121 Oral     SpO2 08/25/19 1121 97 %     Weight 08/25/19 1118 128 lb (58.1 kg)     Height 08/25/19 1118 5\' 9"  (1.753 m)     Head Circumference --      Peak Flow --      Pain Score 08/25/19 1118 8     Pain Loc --      Pain Edu? --      [place] --     Constitutional: Alert and oriented. Well appearing and in no acute distress. Eyes: Conjunctivae are normal. Head: Atraumatic. Nose: No congestion/rhinnorhea. Mouth/Throat: Mucous membranes are moist. Neck: No stridor.   Hematological/Lymphatic/Immunilogical: No cervical lymphadenopathy. Cardiovascular: Normal rate, regular rhythm. Capillary refill in left foot and ankle 3-4 seconds. No pitting edema. Diffuse left ankle and foot edema. Varicose veins noted in left calf.  Respiratory: Normal respiratory effort.  No retractions. Gastrointestinal: Soft and nontender. No distention. No abdominal bruits. No CVA tenderness. Musculoskeletal: No lower extremity tenderness nor edema.  No joint effusions. Neurologic:  Normal speech and language. No gross focal neurologic deficits are appreciated. No gait instability. Skin:  Skin is warm, dry and intact. No rash noted. Psychiatric: Mood and affect are normal. Speech and behavior are normal.  ____________________________________________   LABS (all labs ordered are listed, but only abnormal results are displayed)  Labs Reviewed - No data to display ____________________________________________  EKG  Not indicated. ____________________________________________  RADIOLOGY  ED  MD interpretation:    No DVT on left lower extremity.  Official radiology report(s): US Venous Img Lower Unilateral Left  Result Date: 08/25/2019 CLINICAL DATA:  68 year old with left foot and ankle swelling. EXAM: LEFT LOWER EXTREMITY VENOUS DOPPLER ULTRASOUND TECHNIQUE: Gray-scale sonography with graded compression, as well as color Doppler and duplex ultrasound were performed to evaluate the lower extremity deep venous systems from the level of the common femoral vein and including the common femoral, femoral, profunda femoral, popliteal and calf veins including the posterior tibial, peroneal and gastrocnemius veins when visible. The superficial great saphenous vein was also interrogated. Spectral Doppler was utilized to evaluate flow at rest and with distal augmentation maneuvers in the common femoral, femoral and popliteal veins. COMPARISON:  None. FINDINGS: Contralateral Common Femoral Vein: Respiratory phasicity is normal and symmetric with the symptomatic side. No evidence of thrombus. Normal compressibility. Common Femoral Vein: No evidence of thrombus. Normal compressibility,  respiratory phasicity and response to augmentation. Saphenofemoral Junction: No evidence of thrombus. Normal compressibility and flow on color Doppler imaging. Profunda Femoral Vein: No evidence of thrombus. Normal compressibility and flow on color Doppler imaging. Femoral Vein: No evidence of thrombus. Normal compressibility, respiratory phasicity and response to augmentation. Popliteal Vein: No evidence of thrombus. Normal compressibility, respiratory phasicity and response to augmentation. Calf Veins: No evidence of thrombus. Normal compressibility and flow on color Doppler imaging. Other Findings:  Subcutaneous edema in the left foot. IMPRESSION: Negative for deep venous thrombosis in left lower extremity. Electronically Signed   By: Markus Daft M.D.   On: 08/25/2019 13:27     ____________________________________________   PROCEDURES  Procedure(s) performed (including Critical Care):  Procedures  ____________________________________________   INITIAL IMPRESSION / ASSESSMENT AND PLAN   68 year old female presenting to the emergency department due to left ankle and foot swelling.  She is concerned that she may have a "blood clot."  See HPI for further details.  Plan will be to get an ultrasound.  DIFFERENTIAL DIAGNOSIS  DVT, nonspecific peripheral edema, venous insufficiency  ED COURSE  Ultrasound is negative for DVT.  Patient was advised to wear her compression stockings and elevate her foot and leg as often as possible until swelling has gone down.  She was instructed to call her primary care provider to request a follow-up appointment.  She is to return to the emergency department for any symptoms of concern if she is unable to to see primary care. ____________________________________________   FINAL CLINICAL IMPRESSION(S) / ED DIAGNOSES  Final diagnoses:  Peripheral edema     ED Discharge Orders    None       Note:  This document was prepared using Dragon voice recognition software and may include unintentional dictation errors.   Victorino Dike, FNP 08/25/19 1842    Lilia Pro., MD 08/25/19 2201

## 2019-09-12 ENCOUNTER — Encounter: Payer: Self-pay | Admitting: Internal Medicine

## 2019-09-12 ENCOUNTER — Ambulatory Visit (INDEPENDENT_AMBULATORY_CARE_PROVIDER_SITE_OTHER): Payer: Medicare Other | Admitting: Internal Medicine

## 2019-09-12 ENCOUNTER — Other Ambulatory Visit: Payer: Self-pay

## 2019-09-12 VITALS — BP 114/62 | HR 76 | Ht 69.0 in | Wt 131.0 lb

## 2019-09-12 DIAGNOSIS — Z8719 Personal history of other diseases of the digestive system: Secondary | ICD-10-CM | POA: Diagnosis not present

## 2019-09-12 DIAGNOSIS — J3089 Other allergic rhinitis: Secondary | ICD-10-CM | POA: Diagnosis not present

## 2019-09-12 DIAGNOSIS — I83893 Varicose veins of bilateral lower extremities with other complications: Secondary | ICD-10-CM

## 2019-09-12 NOTE — Progress Notes (Signed)
Date:  09/12/2019   Name:  Audrey Hayden   DOB:  [birth year]   MRN:  [id number]   Chief Complaint: Foot Pain (Christmas Eve seen [facility]- Left ankle and foot pain and swollen. Vericose veins were aggervated. Peripheral Edema was dxed. Was told to see PCP for refferral to Vein and Vascular. )  Leg Pain  The incident occurred more than 1 week ago. There was no injury mechanism. The pain is present in the right ankle and left ankle. The quality of the pain is described as aching. The pain is mild. The pain has been intermittent since onset. Associated symptoms comments: Swelling. Treatments tried: seen at UC - no DVT either leg; started wearing compression stockings which have been beneficial. The treatment provided moderate relief.  Gastroesophageal Reflux She complains of early satiety and globus sensation. She reports no chest pain or no wheezing. This is a chronic problem. The symptoms are aggravated by certain foods. Pertinent negatives include no fatigue. Risk factors include hiatal hernia (hx of gastritis). She has tried a PPI for the symptoms. The treatment provided moderate (but had side effect to Dexilant and omeprazole) relief.    Lab Results  Component Value Date   CREATININE 1.00 05/02/2019   BUN 18 02/25/2019   NA 137 02/25/2019   K 4.2 02/25/2019   CL 96 02/25/2019   CO2 28 02/25/2019   No results found for: CHOL, HDL, LDLCALC, LDLDIRECT, TRIG, CHOLHDL Lab Results  Component Value Date   TSH 2.130 11/19/2017   No results found for: HGBA1C   Review of Systems  Constitutional: Positive for unexpected weight change (has gained back some weight). Negative for chills, fatigue and fever.  HENT: Positive for trouble swallowing (pills and certain foods).   Respiratory: Negative for chest tightness, shortness of breath and wheezing.   Cardiovascular: Positive for leg swelling. Negative for chest pain and palpitations.  Gastrointestinal: Positive for abdominal  distention. Negative for blood in stool, constipation, diarrhea and vomiting.  Neurological: Negative for dizziness and headaches.  Psychiatric/Behavioral: Negative for dysphoric mood. The patient is not nervous/anxious.     Patient Active Problem List   Diagnosis Date Noted  . Asymptomatic spider veins of both lower extremities 02/25/2019  . Gross hematuria 02/25/2019  . Chronic left-sided thoracic back pain 12/09/2017  . Anxiety 12/09/2017  . Environmental and seasonal allergies 06/19/2015  . 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 Osteoporosis 06/19/2015  . Abnormal Pap smear of cervix 06/19/2015  . Elevated HDL 06/19/2015  . History of gastritis 11/08/2014  . Cough, persistent 08/14/2014    Allergies  Allergen Reactions  . Eggs Or Egg-Derived Products Itching and Rash  . Amoxicillin Other (See Comments)    Tingling around the mouth  . Cefdinir     Other reaction(s): Other (See Comments) Bright red face and ankle swelling: possible reaction to the medicine.   . Clarithromycin Other (See Comments)    Causes her to faint.  . Codeine Other (See Comments)    Causes her GI upset.  Marland Kitchen Hydromorphone Hcl Other (See Comments)    Unsure of reaction  . Meperidine Other (See Comments)    Unsure of reaction  . Moxifloxacin Other (See Comments)    Dizziness   . Nsaids     Other reaction(s): Unknown  . Omeprazole Other (See Comments)    Ears ringing with more than 20mg /day  . Other Other (See Comments)    All profins cause her  to faint. All the mycins cause her to faint.  . Prednisone Other (See Comments)    Bright red face and ankle swelling: possible reaction to the medicine.  . Pseudoephedrine Hcl     Other reaction(s): Syncope  . Ranitidine     Other reaction(s): Other (See Comments) SE noted with high dosage Dk urine and stools, HA  . Salicylates Hives  . Aspirin Hives and Rash  . Doxycycline Other (See Comments)    Headache. "Pressure on the brain''  .  Montelukast Sodium Palpitations    Past Surgical History:  Procedure Laterality Date  . BREAST BIOPSY Left    neg  . COLONOSCOPY  2004  . TONSILLECTOMY    . UPPER GI ENDOSCOPY  12/2018    Social History   Tobacco Use  . Smoking status: Former Smoker    Packs/day: 1.50    Years: 5.00    Pack years: 7.50    Types: Cigarettes    Start date: 40    Quit date: 04/02/1975    Years since quitting: 44.4  . Smokeless tobacco: Never Used  . Tobacco comment: smoking cessation materials not required  Substance Use Topics  . Alcohol use: No    Alcohol/week: 0.0 standard drinks  . Drug use: No     Medication list has been reviewed and updated.  Current Meds  Medication Sig  . b complex vitamins tablet Take 1 tablet by mouth 2 (two) times a day.  . Calcium-Phosphorus-Vitamin D (CITRACAL +D3 PO) Take 2 tablets by mouth 2 (two) times daily.  . cetirizine (ZYRTEC) 5 MG tablet Take 5 mg by mouth daily.  . Magnesium 250 MG TABS Take 1 tablet by mouth daily.  . MULTIPLE VITAMINS PO Take 1 tablet by mouth 2 (two) times daily.  Marland Kitchen PANCREATIN PO Take 1,300 mg by mouth 3 (three) times daily.   . Quercetin 250 MG TABS Take 500 mg by mouth 2 (two) times daily.    PHQ 2/9 Scores 09/12/2019 07/06/2019 04/15/2019 02/25/2019  PHQ - 2 Score 0 0 0 0  PHQ- 9 Score - - - -    BP Readings from Last 3 Encounters:  09/12/19 114/62  08/25/19 131/63  07/06/19 118/76    Physical Exam Vitals and nursing note reviewed.  Constitutional:      General: She is not in acute distress.    Appearance: Normal appearance. She is well-developed.  HENT:     Head: Normocephalic and atraumatic.  Cardiovascular:     Rate and Rhythm: Normal rate and regular rhythm.  No extrasystoles are present.    Pulses: Normal pulses.     Heart sounds: Normal heart sounds.     Comments: Varicose veins on left ankle and calf Spider veins on right ankle Pulmonary:     Effort: Pulmonary effort is normal. No respiratory  distress.  Musculoskeletal:        General: Swelling (varicose veins LLE>RLE) present. Normal range of motion.     Cervical back: Normal range of motion.     Right lower leg: Edema present.     Left lower leg: Edema present.  Lymphadenopathy:     Cervical: No cervical adenopathy.  Skin:    General: Skin is warm and dry.     Findings: No rash.  Neurological:     Mental Status: She is alert and oriented to person, place, and time.  Psychiatric:        Behavior: Behavior normal.  Thought Content: Thought content normal.     Wt Readings from Last 3 Encounters:  09/12/19 131 lb (59.4 kg)  08/25/19 128 lb (58.1 kg)  07/06/19 125 lb (56.7 kg)    BP 114/62   Pulse 76   Ht 5\' 9"  (1.753 m)   Wt 131 lb (59.4 kg)   SpO2 97%   BMI 19.35 kg/m   Assessment and Plan: 1. Varicose veins of both legs with edema Continue to wear compression stockings daily; elevate when able Will refer to Vasc Surgeon of choice - Ambulatory referral to Vascular Surgery  2. History of gastritis Intolerant of Dexilant - caused skin to peel She will follow up with GI to discuss other therapy  3. Environmental and seasonal allergies Continue Zyrtec daily   Partially dictated using Editor, commissioning. Any errors are unintentional.  Halina Maidens, MD [facility]  09/12/2019

## 2019-09-16 ENCOUNTER — Other Ambulatory Visit: Payer: Self-pay | Admitting: Gastroenterology

## 2019-09-16 DIAGNOSIS — R05 Cough: Secondary | ICD-10-CM

## 2019-09-16 DIAGNOSIS — R059 Cough, unspecified: Secondary | ICD-10-CM

## 2019-09-16 DIAGNOSIS — K2100 Gastro-esophageal reflux disease with esophagitis, without bleeding: Secondary | ICD-10-CM

## 2019-09-29 ENCOUNTER — Ambulatory Visit
Admission: RE | Admit: 2019-09-29 | Discharge: 2019-09-29 | Disposition: A | Discharge status: Home / Self Care | Payer: Medicare Other | Source: Ambulatory Visit | Attending: Gastroenterology | Admitting: Gastroenterology

## 2019-09-29 DIAGNOSIS — R059 Cough, unspecified: Secondary | ICD-10-CM

## 2019-09-29 DIAGNOSIS — R05 Cough: Secondary | ICD-10-CM

## 2019-09-29 DIAGNOSIS — K2100 Gastro-esophageal reflux disease with esophagitis, without bleeding: Secondary | ICD-10-CM

## 2019-12-22 ENCOUNTER — Other Ambulatory Visit: Payer: Self-pay | Admitting: Specialist

## 2019-12-22 DIAGNOSIS — R053 Chronic cough: Secondary | ICD-10-CM

## 2019-12-22 DIAGNOSIS — J849 Interstitial pulmonary disease, unspecified: Secondary | ICD-10-CM

## 2019-12-22 DIAGNOSIS — R05 Cough: Secondary | ICD-10-CM

## 2019-12-23 ENCOUNTER — Encounter: Payer: Self-pay | Admitting: Internal Medicine

## 2019-12-29 ENCOUNTER — Other Ambulatory Visit: Payer: Self-pay

## 2019-12-29 ENCOUNTER — Ambulatory Visit
Admission: RE | Admit: 2019-12-29 | Discharge: 2019-12-29 | Disposition: A | Discharge status: Home / Self Care | Payer: Medicare Other | Source: Ambulatory Visit | Attending: Specialist | Admitting: Specialist

## 2019-12-29 DIAGNOSIS — J849 Interstitial pulmonary disease, unspecified: Secondary | ICD-10-CM

## 2019-12-29 DIAGNOSIS — R05 Cough: Secondary | ICD-10-CM | POA: Diagnosis present

## 2019-12-29 DIAGNOSIS — R053 Chronic cough: Secondary | ICD-10-CM

## 2020-02-20 ENCOUNTER — Other Ambulatory Visit: Payer: Self-pay | Admitting: Specialist

## 2020-02-20 DIAGNOSIS — R053 Chronic cough: Secondary | ICD-10-CM

## 2020-02-20 DIAGNOSIS — R911 Solitary pulmonary nodule: Secondary | ICD-10-CM

## 2020-05-31 ENCOUNTER — Other Ambulatory Visit: Payer: Self-pay

## 2020-05-31 ENCOUNTER — Ambulatory Visit
Admission: RE | Admit: 2020-05-31 | Discharge: 2020-05-31 | Disposition: A | Discharge status: Home / Self Care | Payer: Medicare Other | Source: Ambulatory Visit | Attending: Specialist | Admitting: Specialist

## 2020-05-31 DIAGNOSIS — R053 Chronic cough: Secondary | ICD-10-CM

## 2020-05-31 DIAGNOSIS — R05 Cough: Secondary | ICD-10-CM | POA: Insufficient documentation

## 2020-05-31 DIAGNOSIS — R911 Solitary pulmonary nodule: Secondary | ICD-10-CM | POA: Insufficient documentation

## 2020-06-12 ENCOUNTER — Ambulatory Visit (LOCAL_COMMUNITY_HEALTH_CENTER): Payer: Self-pay

## 2020-06-12 ENCOUNTER — Other Ambulatory Visit: Payer: Self-pay

## 2020-06-12 DIAGNOSIS — Z23 Encounter for immunization: Secondary | ICD-10-CM

## 2020-06-12 NOTE — Progress Notes (Signed)
Pt to RN clinic requesting FluBlok influenza vaccine due to egg allergy.

## 2020-06-15 ENCOUNTER — Other Ambulatory Visit: Payer: Self-pay | Admitting: Specialist

## 2020-06-15 DIAGNOSIS — R053 Chronic cough: Secondary | ICD-10-CM

## 2020-06-15 DIAGNOSIS — R1312 Dysphagia, oropharyngeal phase: Secondary | ICD-10-CM

## 2020-07-09 ENCOUNTER — Ambulatory Visit (INDEPENDENT_AMBULATORY_CARE_PROVIDER_SITE_OTHER): Payer: Medicare Other

## 2020-07-09 ENCOUNTER — Other Ambulatory Visit: Payer: Self-pay

## 2020-07-09 VITALS — BP 118/78 | HR 65 | Temp 97.6°F | Resp 16 | Ht 68.0 in | Wt 127.6 lb

## 2020-07-09 DIAGNOSIS — Z0000 Encounter for general adult medical examination without abnormal findings: Secondary | ICD-10-CM

## 2020-07-09 DIAGNOSIS — Z780 Asymptomatic menopausal state: Secondary | ICD-10-CM

## 2020-07-09 DIAGNOSIS — Z1231 Encounter for screening mammogram for malignant neoplasm of breast: Secondary | ICD-10-CM | POA: Diagnosis not present

## 2020-07-09 NOTE — Progress Notes (Signed)
Subjective:   Audrey Hayden is a 69 y.o. female who presents for Medicare Annual (Subsequent) preventive examination.  Review of Systems     Cardiac Risk Factors include: advanced age (>70men, >85 women)     Objective:    Today's Vitals   07/09/20 1107  BP: 118/78  Pulse: 65  Resp: 16  Temp: 97.6 F (36.4 C)  TempSrc: Oral  SpO2: 97%  Weight: 127 lb 9.6 oz (57.9 kg)  Height: 5\' 8"  (1.727 m)   Body mass index is 19.4 kg/m.  Advanced Directives 07/09/2020 08/25/2019 07/06/2019 04/26/2018 11/13/2017 09/14/2017 04/20/2017  Does Patient Have a Medical Advance Directive? Yes Yes Yes No Yes No Yes  Type of Paramedic of [place];Living will Living will [place];Living will - Living will;Healthcare Power of [place];Living will  Copy of [place] in Chart? No - copy requested - No - copy requested - - - No - copy requested  Would patient like information on creating a medical advance directive? - No - Patient declined - Yes (MAU/Ambulatory/Procedural Areas - Information given) - - -    Current Medications (verified) Outpatient Encounter Medications as of 07/09/2020  Medication Sig  . b complex vitamins tablet Take 1 tablet by mouth 2 (two) times a day.  . Calcium-Phosphorus-Vitamin D (CITRACAL +D3 PO) Take 2 tablets by mouth 2 (two) times daily.  . cetirizine (ZYRTEC) 10 MG tablet Take 10 mg by mouth daily.  Marland Kitchen esomeprazole (NEXIUM) 20 MG capsule Take 1 capsule by mouth 2 (two) times daily.  . Magnesium 250 MG TABS Take 1 tablet by mouth daily.  . MULTIPLE VITAMINS PO Take 1 tablet by mouth 2 (two) times daily.  Marland Kitchen PANCREATIN PO Take 1,300 mg by mouth 3 (three) times daily.   . Quercetin 250 MG TABS Take 500 mg by mouth 2 (two) times daily.  . [DISCONTINUED] cetirizine (ZYRTEC) 5 MG tablet Take 10 mg by mouth daily.    No facility-administered encounter medications on file as of  07/09/2020.    Allergies (verified) Eggs or egg-derived products, Amoxicillin, Cefdinir, Clarithromycin, Codeine, Hydromorphone hcl, Meperidine, Moxifloxacin, Nsaids, Omeprazole, Other, Prednisone, Pseudoephedrine hcl, Ranitidine, Salicylates, Aspirin, Dexilant [dexlansoprazole], Doxycycline, and Montelukast sodium   History: Past Medical History:  Diagnosis Date  . Allergy   . Bronchiectasis ([place])   . GERD (gastroesophageal reflux disease)   . Gross hematuria 02/25/2019  . IBS (irritable bowel syndrome)   . Osteoporosis    Past Surgical History:  Procedure Laterality Date  . BREAST BIOPSY Left    neg  . COLONOSCOPY  2004  . TONSILLECTOMY    . UPPER GI ENDOSCOPY  12/2018   Family History  Problem Relation Age of Onset  . Hypertension Mother   . CAD Father   . Heart attack Father   . Breast cancer Neg Hx   . Colon cancer Neg Hx    Social History   Socioeconomic History  . Marital status: Married    Spouse name: Not on file  . Number of children: 2  . Years of education: Not on file  . Highest education level: Bachelor's degree (e.g., BA, AB, BS)  Occupational History  . Occupation: Retired  Tobacco Use  . Smoking status: Former Smoker    Packs/day: 1.50    Years: 5.00    Pack years: 7.50    Types: Cigarettes    Start date: 1971    Quit date: 04/02/1975  Years since quitting: 45.3  . Smokeless tobacco: Never Used  . Tobacco comment: smoking cessation materials not required  Vaping Use  . Vaping Use: Never used  Substance and Sexual Activity  . Alcohol use: No    Alcohol/week: 0.0 standard drinks  . Drug use: No  . Sexual activity: Not Currently  Other Topics Concern  . Not on file  Social History Narrative  . Not on file   Social Determinants of Health   Financial Resource Strain: Low Risk   . Difficulty of Paying Living Expenses: Not hard at all  Food Insecurity: No Food Insecurity  . Worried About Charity fundraiser in the Last Year: Never true  .  Ran Out of Food in the Last Year: Never true  Transportation Needs: No Transportation Needs  . Lack of Transportation (Medical): No  . Lack of Transportation (Non-Medical): No  Physical Activity: Sufficiently Active  . Days of Exercise per Week: 7 days  . Minutes of Exercise per Session: 30 min  Stress: No Stress Concern Present  . Feeling of Stress : Only a little  Social Connections: Moderately Integrated  . Frequency of Communication with Friends and Family: More than three times a week  . Frequency of Social Gatherings with Friends and Family: Three times a week  . Attends Religious Services: More than 4 times per year  . Active Member of Clubs or Organizations: No  . Attends Archivist Meetings: Never  . Marital Status: Married    Tobacco Counseling Counseling given: Not Answered Comment: smoking cessation materials not required   Clinical Intake:  Pre-visit preparation completed: Yes  Pain : No/denies pain     BMI - recorded: 19.4 Nutritional Status: BMI of 19-24  Normal Nutritional Risks: None Diabetes: No  How often do you need to have someone help you when you read instructions, pamphlets, or other written materials from your doctor or pharmacy?: 1 - Never    Interpreter Needed?: No  Information entered by :: Clemetine Marker LPN   Activities of Daily Living In your present state of health, do you have any difficulty performing the following activities: 07/09/2020  Hearing? N  Comment declines hearing aids  Vision? N  Difficulty concentrating or making decisions? N  Walking or climbing stairs? N  Dressing or bathing? N  Doing errands, shopping? N  Preparing Food and eating ? N  Using the Toilet? N  In the past six months, have you accidently leaked urine? N  Do you have problems with loss of bowel control? N  Managing your Medications? N  Managing your Finances? N  Housekeeping or managing your Housekeeping? N  Some recent data might be hidden     Patient Care Team: Glean Hess, MD as PCP - General (Internal Medicine) Benjaman Kindler, MD as Consulting Physician (Obstetrics and Gynecology)  Indicate any recent Medical Services you may have received from other than [facility] providers in the past year (date may be approximate).     Assessment:   This is a routine wellness examination for [place].  Hearing/Vision screen  Hearing Screening   125Hz  250Hz  500Hz  1000Hz  2000Hz  3000Hz  4000Hz  6000Hz  8000Hz   Right ear:           Left ear:           Comments: Pt denies hearing difficulty  Vision Screening Comments: Annual vision screening with Dr. Gloriann Loan  Dietary issues and exercise activities discussed: Current Exercise Habits: Home exercise routine, Type of exercise:  walking, Time (Minutes): 30, Frequency (Times/Week): 7, Weekly Exercise (Minutes/Week): 210, Intensity: Moderate, Exercise limited by: None identified  Goals    . DIET - INCREASE LEAN PROTEINS     Recommend to drink 1-2 protein shakes per day    . DIET - INCREASE WATER INTAKE     Recommend to drink at least 6-8 8oz glasses of water per day.      Depression Screen PHQ 2/9 Scores 07/09/2020 09/12/2019 07/06/2019 04/15/2019 02/25/2019 04/26/2018 04/20/2017  PHQ - 2 Score 0 0 0 0 0 0 0  PHQ- 9 Score - - - - - 0 -    Fall Risk Fall Risk  07/09/2020 07/06/2019 04/28/2019 02/25/2019 04/26/2018  Falls in the past year? 0 0 0 1 Yes  Comment - - - - wave knocker her down  Number falls in past yr: 0 0 - 1 1  Injury with Fall? 0 0 - 0 No  Risk for fall due to : No Fall Risks - - History of fall(s);Impaired balance/gait Impaired vision  Risk for fall due to: Comment - - - - wears eyeglasses  Follow up Falls prevention discussed Falls prevention discussed Falls evaluation completed Falls evaluation completed Falls evaluation completed;Education provided;Falls prevention discussed    Any stairs in or around the home? No  If so, are there any without handrails? No  Home free of  loose throw rugs in walkways, pet beds, electrical cords, etc? Yes  Adequate lighting in your home to reduce risk of falls? Yes   ASSISTIVE DEVICES UTILIZED TO PREVENT FALLS:  Life alert? No  Use of a cane, walker or w/c? No  Grab bars in the bathroom? Yes  Shower chair or bench in shower? Yes  Elevated toilet seat or a handicapped toilet? No  TIMED UP AND GO:  Was the test performed? Yes .  Length of time to ambulate 10 feet: 4 sec.   Gait steady and fast without use of assistive device  Cognitive Function: 6CIT deferred for 2021 AWV; pt states no memory issues.      6CIT Screen 04/26/2018 04/20/2017  What Year? 0 points 0 points  What month? 0 points 0 points  What time? 0 points 0 points  Count back from 20 0 points 0 points  Months in reverse 0 points 0 points  Repeat phrase 0 points 0 points  Total Score 0 0    Immunizations Immunization History  Administered Date(s) Administered  . Influenza, Quadrivalent, Recombinant, Inj, Pf 07/21/2019, 06/12/2020  . Influenza-Unspecified 07/06/2015  . PFIZER SARS-COV-2 Vaccination 10/21/2019, 11/11/2019  . Pneumococcal Conjugate-13 11/09/2017  . Tdap 10/27/2018    TDAP status: Up to date   Flu Vaccine status: Up to date   Pneumococcal vaccine status: Declined,  Education has been provided regarding the importance of this vaccine but patient still declined. Advised may receive this vaccine at local pharmacy or Health Dept. Aware to provide a copy of the vaccination record if obtained from local pharmacy or Health Dept. Verbalized acceptance and understanding.    Covid-19 vaccine status: Completed vaccines  Qualifies for Shingles Vaccine? Yes   Zostavax completed No   Shingrix Completed?: No.    Education has been provided regarding the importance of this vaccine. Patient has been advised to call insurance company to determine out of pocket expense if they have not yet received this vaccine. Advised may also receive vaccine at  local pharmacy or Health Dept. Verbalized acceptance and understanding.  Screening Tests Health Maintenance  Topic  Date Due  . Hepatitis C Screening  Never done  . PNA vac Low Risk Adult (2 of 2 - PPSV23) 11/10/2018  . MAMMOGRAM  07/14/2019  . COLONOSCOPY  10/16/2024  . TETANUS/TDAP  10/27/2028  . INFLUENZA VACCINE  Completed  . DEXA SCAN  Completed  . COVID-19 Vaccine  Completed    Health Maintenance  Health Maintenance Due  Topic Date Due  . Hepatitis C Screening  Never done  . PNA vac Low Risk Adult (2 of 2 - PPSV23) 11/10/2018  . MAMMOGRAM  07/14/2019   Colorectal cancer screening status: Colonoscopy completed 2004. Pt declines repeat screening at this time but would be agreeable to Cologuard and would like to discuss with Dr. Army Melia at next visit.    Mammogram status: Completed 07/13/18. Repeat every year   Bone Density status: Completed 06/29/17. Results reflect: Bone density results: OSTEOPOROSIS. Repeat every 2 years.  Lung Cancer Screening: (Low Dose CT Chest recommended if Age 42-80 years, 30 pack-year currently smoking OR have quit w/in 15years.) does not qualify.    Additional Screening:  Hepatitis C Screening: does qualify; postponed  Vision Screening: Recommended annual ophthalmology exams for early detection of glaucoma and other disorders of the eye. Is the patient up to date with their annual eye exam?  Yes  Who is the provider or what is the name of the office in which the patient attends annual eye exams? Dr. Gloriann Loan   Dental Screening: Recommended annual dental exams for proper oral hygiene  Community Resource Referral / Chronic Care Management: CRR required this visit?  No   CCM required this visit?  No      Plan:     I have personally reviewed and noted the following in the patient's chart:   . Medical and social history . Use of alcohol, tobacco or illicit drugs  . Current medications and supplements . Functional ability and  status . Nutritional status . Physical activity . Advanced directives . List of other physicians . Hospitalizations, surgeries, and ER visits in previous 12 months . Vitals . Screenings to include cognitive, depression, and falls . Referrals and appointments  In addition, I have reviewed and discussed with patient certain preventive protocols, quality metrics, and best practice recommendations. A written personalized care plan for preventive services as well as general preventive health recommendations were provided to patient.     Clemetine Marker, LPN   10/04/3433   Nurse Notes: pt doing well and appreciative of visit today. Pt advised to schedule appt with Dr. Army Melia for CPE

## 2020-07-09 NOTE — Patient Instructions (Signed)
Audrey Hayden , Thank you for taking time to come for your Medicare Wellness Visit. I appreciate your ongoing commitment to your health goals. Please review the following plan we discussed and let me know if I can assist you in the future.   Screening recommendations/referrals: Colonoscopy: done 2004. Please discuss Cologuard at your next visit with Dr. Army Melia Mammogram: done 07/13/18. Please call [phone] to schedule your mammogram and bone density screening.  Bone Density: done 06/29/17 Recommended yearly ophthalmology/optometry visit for glaucoma screening and checkup Recommended yearly dental visit for hygiene and checkup  Vaccinations: Influenza vaccine: done 06/12/20 Pneumococcal vaccine: done 11/09/17 Tdap vaccine: done 10/27/18 Shingles vaccine: Shingrix discussed. Please contact your pharmacy for coverage information.  Covid-19:done 10/21/19 & 11/11/19  Advanced directives: Please bring a copy of your health care power of attorney and living will to the office at your convenience.  Conditions/risks identified: Keep up the great work!  Next appointment: Follow up in one year for your annual wellness visit    Preventive Care 65 Years and Older, Female Preventive care refers to lifestyle choices and visits with your health care provider that can promote health and wellness. What does preventive care include?  A yearly physical exam. This is also called an annual well check.  Dental exams once or twice a year.  Routine eye exams. Ask your health care provider how often you should have your eyes checked.  Personal lifestyle choices, including:  Daily care of your teeth and gums.  Regular physical activity.  Eating a healthy diet.  Avoiding tobacco and drug use.  Limiting alcohol use.  Practicing safe sex.  Taking low-dose aspirin every day.  Taking vitamin and mineral supplements as recommended by your health care provider. What happens during an annual well  check? The services and screenings done by your health care provider during your annual well check will depend on your age, overall health, lifestyle risk factors, and family history of disease. Counseling  Your health care provider may ask you questions about your:  Alcohol use.  Tobacco use.  Drug use.  Emotional well-being.  Home and relationship well-being.  Sexual activity.  Eating habits.  History of falls.  Memory and ability to understand (cognition).  Work and work Statistician.  Reproductive health. Screening  You may have the following tests or measurements:  Height, weight, and BMI.  Blood pressure.  Lipid and cholesterol levels. These may be checked every 5 years, or more frequently if you are over 44 years old.  Skin check.  Lung cancer screening. You may have this screening every year starting at age 102 if you have a 30-pack-year history of smoking and currently smoke or have quit within the past 15 years.  Fecal occult blood test (FOBT) of the stool. You may have this test every year starting at age 10.  Flexible sigmoidoscopy or colonoscopy. You may have a sigmoidoscopy every 5 years or a colonoscopy every 10 years starting at age 51.  Hepatitis C blood test.  Hepatitis B blood test.  Sexually transmitted disease (STD) testing.  Diabetes screening. This is done by checking your blood sugar (glucose) after you have not eaten for a while (fasting). You may have this done every 1-3 years.  Bone density scan. This is done to screen for osteoporosis. You may have this done starting at age 36.  Mammogram. This may be done every 1-2 years. Talk to your health care provider about how often you should have regular mammograms. Talk with your  health care provider about your test results, treatment options, and if necessary, the need for more tests. Vaccines  Your health care provider may recommend certain vaccines, such as:  Influenza vaccine. This is  recommended every year.  Tetanus, diphtheria, and acellular pertussis (Tdap, Td) vaccine. You may need a Td booster every 10 years.  Zoster vaccine. You may need this after age 73.  Pneumococcal 13-valent conjugate (PCV13) vaccine. One dose is recommended after age 85.  Pneumococcal polysaccharide (PPSV23) vaccine. One dose is recommended after age 64. Talk to your health care provider about which screenings and vaccines you need and how often you need them. This information is not intended to replace advice given to you by your health care provider. Make sure you discuss any questions you have with your health care provider. Document Released: 09/14/2015 Document Revised: 05/07/2016 Document Reviewed: 06/19/2015 Elsevier Interactive Patient Education  2017 [place] Prevention in the Home Falls can cause injuries. They can happen to people of all ages. There are many things you can do to make your home safe and to help prevent falls. What can I do on the outside of my home?  Regularly fix the edges of walkways and driveways and fix any cracks.  Remove anything that might make you trip as you walk through a door, such as a raised step or threshold.  Trim any bushes or trees on the path to your home.  Use bright outdoor lighting.  Clear any walking paths of anything that might make someone trip, such as rocks or tools.  Regularly check to see if handrails are loose or broken. Make sure that both sides of any steps have handrails.  Any raised decks and porches should have guardrails on the edges.  Have any leaves, snow, or ice cleared regularly.  Use sand or salt on walking paths during winter.  Clean up any spills in your garage right away. This includes oil or grease spills. What can I do in the bathroom?  Use night lights.  Install grab bars by the toilet and in the tub and shower. Do not use towel bars as grab bars.  Use non-skid mats or decals in the tub or  shower.  If you need to sit down in the shower, use a plastic, non-slip stool.  Keep the floor dry. Clean up any water that spills on the floor as soon as it happens.  Remove soap buildup in the tub or shower regularly.  Attach bath mats securely with double-sided non-slip rug tape.  Do not have throw rugs and other things on the floor that can make you trip. What can I do in the bedroom?  Use night lights.  Make sure that you have a light by your bed that is easy to reach.  Do not use any sheets or blankets that are too big for your bed. They should not hang down onto the floor.  Have a firm chair that has side arms. You can use this for support while you get dressed.  Do not have throw rugs and other things on the floor that can make you trip. What can I do in the kitchen?  Clean up any spills right away.  Avoid walking on wet floors.  Keep items that you use a lot in easy-to-reach places.  If you need to reach something above you, use a strong step stool that has a grab bar.  Keep electrical cords out of the way.  Do not use  floor polish or wax that makes floors slippery. If you must use wax, use non-skid floor wax.  Do not have throw rugs and other things on the floor that can make you trip. What can I do with my stairs?  Do not leave any items on the stairs.  Make sure that there are handrails on both sides of the stairs and use them. Fix handrails that are broken or loose. Make sure that handrails are as long as the stairways.  Check any carpeting to make sure that it is firmly attached to the stairs. Fix any carpet that is loose or worn.  Avoid having throw rugs at the top or bottom of the stairs. If you do have throw rugs, attach them to the floor with carpet tape.  Make sure that you have a light switch at the top of the stairs and the bottom of the stairs. If you do not have them, ask someone to add them for you. What else can I do to help prevent  falls?  Wear shoes that:  Do not have high heels.  Have rubber bottoms.  Are comfortable and fit you well.  Are closed at the toe. Do not wear sandals.  If you use a stepladder:  Make sure that it is fully opened. Do not climb a closed stepladder.  Make sure that both sides of the stepladder are locked into place.  Ask someone to hold it for you, if possible.  Clearly mark and make sure that you can see:  Any grab bars or handrails.  First and last steps.  Where the edge of each step is.  Use tools that help you move around (mobility aids) if they are needed. These include:  Canes.  Walkers.  Scooters.  Crutches.  Turn on the lights when you go into a dark area. Replace any light bulbs as soon as they burn out.  Set up your furniture so you have a clear path. Avoid moving your furniture around.  If any of your floors are uneven, fix them.  If there are any pets around you, be aware of where they are.  Review your medicines with your doctor. Some medicines can make you feel dizzy. This can increase your chance of falling. Ask your doctor what other things that you can do to help prevent falls. This information is not intended to replace advice given to you by your health care provider. Make sure you discuss any questions you have with your health care provider. Document Released: 06/14/2009 Document Revised: 01/24/2016 Document Reviewed: 09/22/2014 Elsevier Interactive Patient Education  2017 [employer].

## 2020-08-10 ENCOUNTER — Ambulatory Visit (INDEPENDENT_AMBULATORY_CARE_PROVIDER_SITE_OTHER): Payer: Medicare Other | Admitting: Internal Medicine

## 2020-08-10 ENCOUNTER — Encounter: Payer: Self-pay | Admitting: Internal Medicine

## 2020-08-10 ENCOUNTER — Other Ambulatory Visit: Payer: Self-pay

## 2020-08-10 VITALS — BP 112/70 | HR 65 | Ht 69.0 in | Wt 128.0 lb

## 2020-08-10 DIAGNOSIS — J479 Bronchiectasis, uncomplicated: Secondary | ICD-10-CM | POA: Insufficient documentation

## 2020-08-10 DIAGNOSIS — Z1211 Encounter for screening for malignant neoplasm of colon: Secondary | ICD-10-CM | POA: Diagnosis not present

## 2020-08-10 DIAGNOSIS — M8000XD Age-related osteoporosis with current pathological fracture, unspecified site, subsequent encounter for fracture with routine healing: Secondary | ICD-10-CM

## 2020-08-10 NOTE — Progress Notes (Signed)
Date:  08/10/2020   Name:  Audrey Hayden   DOB:  03-07-1951   MRN:  [id number]   Chief Complaint: Colon Cancer Screening (Wants a Cologuard) She had a colonoscopy remotely - around 2010 that was normal.  No family hx of colon cancer.  No current intestinal concerns.  Immunization History  Administered Date(s) Administered  . Influenza, Quadrivalent, Recombinant, Inj, Pf 07/21/2019, 06/12/2020  . Influenza-Unspecified 07/06/2015  . PFIZER SARS-COV-2 Vaccination 10/21/2019, 11/11/2019  . Pneumococcal Conjugate-13 11/09/2017  . Tdap 10/27/2018    Osteoporosis - has DEXA scheduled for January.  Not on prescription medication.  She is taking calcium and vitamin D.  Bronchiectasis - by CT; followed by Pulmonary. CT 12/2019: IMPRESSION: 1. Stable RIGHT middle lobe pulmonary nodule over the 5 month interval from comparison CT. Findings suggest benign etiology. 2. New part solid nodule in the RIGHT upper lobe. Rapid onset of nodularity suggest benign infectious or inflammatory process. 3. Recommend follow-up CT in 6 months to demonstrate 1 year stability for the RIGHT middle lobe nodule and probable resolution of the presumed inflammatory nodule in the RIGHT upper lobe. 4. Stable lower lobe bronchiectasis.  Lab Results  Component Value Date   CREATININE 1.00 05/02/2019   BUN 18 02/25/2019   NA 137 02/25/2019   K 4.2 02/25/2019   CL 96 02/25/2019   CO2 28 02/25/2019   No results found for: CHOL, HDL, LDLCALC, LDLDIRECT, TRIG, CHOLHDL Lab Results  Component Value Date   TSH 2.130 11/19/2017   No results found for: HGBA1C Lab Results  Component Value Date   WBC 8.8 02/25/2019   HGB 13.8 02/25/2019   HCT 42.2 02/25/2019   MCV 90 02/25/2019   PLT 228 02/25/2019   Lab Results  Component Value Date   ALT 15 02/25/2019   AST 22 02/25/2019   ALKPHOS 92 02/25/2019   BILITOT 0.5 02/25/2019    Review of Systems  Constitutional: Positive for appetite change (always  hungry). Negative for fatigue, fever and unexpected weight change.  HENT: Negative for tinnitus and trouble swallowing.   Eyes: Negative for visual disturbance.  Respiratory: Positive for cough. Negative for chest tightness and shortness of breath.   Cardiovascular: Negative for chest pain, palpitations and leg swelling.  Gastrointestinal: Negative for abdominal pain, blood in stool, constipation and diarrhea.  Endocrine: Negative for polydipsia and polyuria.  Genitourinary: Negative for dysuria and hematuria.  Musculoskeletal: Negative for arthralgias.  Neurological: Negative for tremors, numbness and headaches.  Psychiatric/Behavioral: Positive for sleep disturbance. Negative for dysphoric mood.    Patient Active Problem List   Diagnosis Date Noted  . Bronchiectasis ([place]) 08/10/2020  . Asymptomatic spider veins of both lower extremities 02/25/2019  . Chronic left-sided thoracic back pain 12/09/2017  . Anxiety 12/09/2017  . Environmental and seasonal allergies 06/19/2015  . 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 Osteoporosis 06/19/2015  . Abnormal Pap smear of cervix 06/19/2015  . Elevated HDL 06/19/2015  . History of gastritis 11/08/2014  . Cough, persistent 08/14/2014    Allergies  Allergen Reactions  . Eggs Or Egg-Derived Products Itching and Rash  . Amoxicillin Other (See Comments)    Tingling around the mouth  . Cefdinir     Other reaction(s): Other (See Comments) Bright red face and ankle swelling: possible reaction to the medicine.   . Clarithromycin Other (See Comments)    Causes her to faint.  . Codeine Other (See Comments)    Causes her GI upset.  Marland Kitchen  Hydromorphone Hcl Other (See Comments)    Unsure of reaction  . Meperidine Other (See Comments)    Unsure of reaction  . Moxifloxacin Other (See Comments)    Dizziness   . Nsaids     Other reaction(s): Unknown  . Omeprazole Other (See Comments)    Ears ringing with more than 20mg /day  . Other  Other (See Comments)    All profins cause her to faint. All the mycins cause her to faint.  . Prednisone Other (See Comments)    Bright red face and ankle swelling: possible reaction to the medicine.  . Pseudoephedrine Hcl     Other reaction(s): Syncope  . Ranitidine     Other reaction(s): Other (See Comments) SE noted with high dosage Dk urine and stools, HA  . Salicylates Hives  . Aspirin Hives and Rash  . Dexilant [Dexlansoprazole] Rash    Peeling skin  . Doxycycline Other (See Comments)    Headache. "Pressure on the brain''  . Montelukast Sodium Palpitations    Past Surgical History:  Procedure Laterality Date  . BREAST BIOPSY Left    neg  . COLONOSCOPY  2004  . TONSILLECTOMY    . UPPER GI ENDOSCOPY  12/2018    Social History   Tobacco Use  . Smoking status: Former Smoker    Packs/day: 1.50    Years: 5.00    Pack years: 7.50    Types: Cigarettes    Start date: 35    Quit date: 04/02/1975    Years since quitting: 45.3  . Smokeless tobacco: Never Used  . Tobacco comment: smoking cessation materials not required  Vaping Use  . Vaping Use: Never used  Substance Use Topics  . Alcohol use: No    Alcohol/week: 0.0 standard drinks  . Drug use: No     Medication list has been reviewed and updated.  Current Meds  Medication Sig  . ascorbic acid (VITAMIN C) 500 MG tablet Take by mouth.  Marland Kitchen b complex vitamins tablet Take 1 tablet by mouth 2 (two) times a day.  . Calcium-Phosphorus-Vitamin D (CITRACAL +D3 PO) Take 2 tablets by mouth 2 (two) times daily.  . cetirizine (ZYRTEC) 10 MG tablet Take 10 mg by mouth daily.  Marland Kitchen esomeprazole (NEXIUM) 20 MG capsule Take 1 capsule by mouth 2 (two) times daily.  . Magnesium 250 MG TABS Take 1 tablet by mouth daily.  . MULTIPLE VITAMINS PO Take 1 tablet by mouth 2 (two) times daily.  Marland Kitchen PANCREATIN PO Take 1,300 mg by mouth 3 (three) times daily.   . Quercetin 250 MG TABS Take 500 mg by mouth 2 (two) times daily.    PHQ 2/9  Scores 08/10/2020 07/09/2020 09/12/2019 07/06/2019  PHQ - 2 Score 4 0 0 0  PHQ- 9 Score 10 - - -    GAD 7 : Generalized Anxiety Score 08/10/2020 11/04/2017  Nervous, Anxious, on Edge 2 3  Control/stop worrying 2 2  Worry too much - different things 2 2  Trouble relaxing 2 3  Restless 2 3  Easily annoyed or irritable 2 1  Afraid - awful might happen 0 3  Total GAD 7 Score 12 17  Anxiety Difficulty Somewhat difficult Extremely difficult    BP Readings from Last 3 Encounters:  08/10/20 112/70  07/09/20 118/78  09/12/19 114/62    Physical Exam Vitals and nursing note reviewed.  Constitutional:      General: She is not in acute distress.    Appearance: Normal  appearance. She is well-developed.  HENT:     Head: Normocephalic and atraumatic.  Cardiovascular:     Rate and Rhythm: Normal rate and regular rhythm.     Pulses: Normal pulses.  Pulmonary:     Effort: Pulmonary effort is normal. No respiratory distress.     Breath sounds: No wheezing or rhonchi.  Musculoskeletal:     Right lower leg: No edema.     Left lower leg: No edema.  Skin:    General: Skin is warm and dry.     Findings: No rash.  Neurological:     General: No focal deficit present.     Mental Status: She is alert and oriented to person, place, and time.  Psychiatric:        Mood and Affect: Mood and affect and mood normal.     Wt Readings from Last 3 Encounters:  08/10/20 128 lb (58.1 kg)  07/09/20 127 lb 9.6 oz (57.9 kg)  09/12/19 131 lb (59.4 kg)    BP 112/70   Pulse 65   Ht 5\' 9"  (1.753 m)   Wt 128 lb (58.1 kg)   SpO2 99%   BMI 18.90 kg/m   Assessment and Plan: 1. Colon cancer screening Will order Cologuard  - Cologuard  2. Bronchiectasis without complication ([place]) Followed by Pulmonary Trying to follow a diet to help reduce mucus production  3. Age-related osteoporosis with current pathological fracture with routine healing, subsequent encounter On Calcium and vitamin D   Partially  dictated using Editor, commissioning. Any errors are unintentional.  Halina Maidens, MD [facility]  08/10/2020

## 2020-08-22 ENCOUNTER — Other Ambulatory Visit (HOSPITAL_COMMUNITY): Payer: Self-pay | Admitting: Specialist

## 2020-08-22 ENCOUNTER — Other Ambulatory Visit: Payer: Self-pay | Admitting: Specialist

## 2020-08-22 DIAGNOSIS — J471 Bronchiectasis with (acute) exacerbation: Secondary | ICD-10-CM

## 2020-09-05 LAB — FECAL OCCULT BLOOD, GUAIAC: Fecal Occult Blood: NEGATIVE

## 2020-09-06 ENCOUNTER — Telehealth: Payer: Self-pay | Admitting: Internal Medicine

## 2020-09-06 LAB — COLOGUARD

## 2020-09-06 NOTE — Telephone Encounter (Signed)
Cologuard is negative.  Repeat in 3 yrs for screening purposes. 

## 2020-09-10 ENCOUNTER — Other Ambulatory Visit: Payer: Self-pay

## 2020-09-10 ENCOUNTER — Ambulatory Visit
Admission: RE | Admit: 2020-09-10 | Discharge: 2020-09-10 | Disposition: A | Discharge status: Home / Self Care | Payer: Medicare Other | Source: Ambulatory Visit | Attending: Internal Medicine | Admitting: Internal Medicine

## 2020-09-10 DIAGNOSIS — Z1231 Encounter for screening mammogram for malignant neoplasm of breast: Secondary | ICD-10-CM

## 2020-09-10 DIAGNOSIS — R29890 Loss of height: Secondary | ICD-10-CM | POA: Diagnosis not present

## 2020-09-10 DIAGNOSIS — Z780 Asymptomatic menopausal state: Secondary | ICD-10-CM | POA: Insufficient documentation

## 2020-09-10 DIAGNOSIS — Z8739 Personal history of other diseases of the musculoskeletal system and connective tissue: Secondary | ICD-10-CM | POA: Diagnosis not present

## 2020-09-10 NOTE — Telephone Encounter (Signed)
Patient informed of negative cologuard and will repeat in 3 years.

## 2020-09-28 ENCOUNTER — Ambulatory Visit: Payer: Self-pay

## 2020-09-28 NOTE — Telephone Encounter (Signed)
Patient called stating that she was Dx with COVID-19 Her husband had an antigen test that he got from work and her test was positive. He husband is positive as well.  She states her worse symptom is cough dry most of the time but ocasoinal productive.  She also states she has a headache that is being controled with tylenol. She has no fever but is taking tylenol. She states that she has a lung nodule she see pulmonology for this.  Symptom tiers and treatment plans read to patient. Criteria for ending isolation 5 days isolated no fever for 24 hour and no fever reducing medication, respiratory symptoms improved  Then 5 days masked in public.  Good preventative practices reviewed. She verbalized understanding.  She will do a tele visit through [place] web site tonight. Note will be routed to office.  Reason for Disposition . [1] Continuous (nonstop) coughing interferes with work or school AND [2] no improvement using cough treatment per Care Advice  Answer Assessment - Initial Assessment Questions 1. COVID-19 DIAGNOSIS: "Who made your COVID-19 diagnosis?" "Was it confirmed by a positive lab test?" If not diagnosed by a HCP, ask "Are there lots of cases (community spread) where you live?" Note: See public health department website, if unsure.     Today atigen test from home 2. COVID-19 EXPOSURE: "Was there any known exposure to COVID before the symptoms began?" CDC Definition of close contact: within 6 feet (2 meters) for a total of 15 minutes or more over a 24-hour period.     Yes husband 3. ONSET: "When did the COVID-19 symptoms start?"      Yesterday afternoon 4. WORST SYMPTOM: "What is your worst symptom?" (e.g., cough, fever, shortness of breath, muscle aches)     Cough headache 5. COUGH: "Do you have a cough?" If Yes, ask: "How bad is the cough?"      Hurts to cough cant sleep 6. FEVER: "Do you have a fever?" If Yes, ask: "What is your temperature, how was it measured, and when did it start?"      No fever 7. RESPIRATORY STATUS: "Describe your breathing?" (e.g., shortness of breath, wheezing, unable to speak)     fine 8. BETTER-SAME-WORSE: "Are you getting better, staying the same or getting worse compared to yesterday?"  If getting worse, ask, "In what way?"     Better a little bit 9. HIGH RISK DISEASE: "Do you have any chronic medical problems?" (e.g., asthma, heart or lung disease, weak immune system, obesity, etc.)     Issues with lungs nodule on lung 10. VACCINE: "Have you gotten the COVID-19 vaccine?" If Yes ask: "Which one, how many shots, when did you get it?"       Both vaccines pfizer 11. PREGNANCY: "Is there any chance you are pregnant?" "When was your last menstrual period?"       N/A 12. OTHER SYMPTOMS: "Do you have any other symptoms?"  (e.g., chills, fatigue, headache, loss of smell or taste, muscle pain, sore throat; new loss of smell or taste especially support the diagnosis of COVID-19) fatigue, headache  Protocols used: CORONAVIRUS (COVID-19) DIAGNOSED OR SUSPECTED-A-AH

## 2020-10-01 ENCOUNTER — Ambulatory Visit: Payer: Self-pay | Admitting: Internal Medicine

## 2020-10-01 NOTE — Telephone Encounter (Signed)
Noted  Pt will do a tele visit with [place].  KP

## 2020-10-01 NOTE — Telephone Encounter (Signed)
Called and informed patient that these symptoms all go along with the virus and can be very uncomfortable. Told her the virus needs to run its course. If she beings to feel short of breathe at rest or has a fever that does not break with tylenol, told her to go to the emergency room for help. Otherwise, told her to stay hydrated, rest as much as her body allows, and take tylenol every 6 hours for sinus pain/ headache.

## 2020-10-01 NOTE — Telephone Encounter (Signed)
Pt reports home test Friday 09/28/20, positive. Husband positive as well. States worsening symptoms "Mostly sinus pressure, fatigue." Also reports cough, mild body aches, afebrile. States "Feeling worse today." Pt having phone issues at time of call.  Advised NT would route to provider for disposition.   Please advise: [phone]  Reason for Disposition . HIGH RISK for severe COVID complications (e.g., age > 79 years, obesity with BMI > 25, pregnant, chronic lung disease or other chronic medical condition)  (Exception: Already seen by PCP and no new or worsening symptoms.)  Answer Assessment - Initial Assessment Questions 1. COVID-19 DIAGNOSIS: "Who made your COVID-19 diagnosis?" "Was it confirmed by a positive lab test?" If not diagnosed by a HCP, ask "Are there lots of cases (community spread) where you live?" Note: See public health department website, if unsure.     Tested Friday afternoon, resulted Friday. Home test 2. COVID-19 EXPOSURE: "Was there any known exposure to COVID before the symptoms began?" CDC Definition of close contact: within 6 feet (2 meters) for a total of 15 minutes or more over a 24-hour period.      Yes, household 3. ONSET: "When did the COVID-19 symptoms start?"      Thursday 09/27/20 4. WORST SYMPTOM: "What is your worst symptom?" (e.g., cough, fever, shortness of breath, muscle aches)     Sinus pressure worse symptom. 5. COUGH: "Do you have a cough?" If Yes, ask: "How bad is the cough?"       Cough, moderate, awake at night at times 6. FEVER: "Do you have a fever?" If Yes, ask: "What is your temperature, how was it measured, and when did it start?"     no 7. RESPIRATORY STATUS: "Describe your breathing?" (e.g., shortness of breath, wheezing, unable to speak)      No 8. BETTER-SAME-WORSE: "Are you getting better, staying the same or getting worse compared to yesterday?"  If getting worse, ask, "In what way?"     Worse 9. HIGH RISK DISEASE: "Do you have any  chronic medical problems?" (e.g., asthma, heart or lung disease, weak immune system, obesity, etc.)     Allergies, sinus issues 10. VACCINE: "Have you gotten the COVID-19 vaccine?" If Yes ask: "Which one, how many shots, when did you get it?"       Yes,2 Pfizers,  LAst one 11/11/19 11. PREGNANCY: "Is there any chance you are pregnant?" "When was your last menstrual period?"       no 12. OTHER SYMPTOMS: "Do you have any other symptoms?"  (e.g., chills, fatigue, headache, loss of smell or taste, muscle pain, sore throat; new loss of smell or taste especially support the diagnosis of COVID-19)       Fatigued.  Protocols used: CORONAVIRUS (COVID-19) DIAGNOSED OR SUSPECTED-A-AH

## 2020-10-26 ENCOUNTER — Telehealth: Payer: Self-pay

## 2020-10-26 ENCOUNTER — Encounter: Payer: Self-pay | Admitting: Internal Medicine

## 2020-10-26 ENCOUNTER — Other Ambulatory Visit: Payer: Self-pay

## 2020-10-26 ENCOUNTER — Ambulatory Visit (INDEPENDENT_AMBULATORY_CARE_PROVIDER_SITE_OTHER): Payer: Medicare Other | Admitting: Internal Medicine

## 2020-10-26 VITALS — BP 112/68 | HR 69 | Temp 98.2°F | Ht 68.0 in | Wt 130.0 lb

## 2020-10-26 DIAGNOSIS — J479 Bronchiectasis, uncomplicated: Secondary | ICD-10-CM

## 2020-10-26 DIAGNOSIS — R053 Chronic cough: Secondary | ICD-10-CM | POA: Diagnosis not present

## 2020-10-26 DIAGNOSIS — J3089 Other allergic rhinitis: Secondary | ICD-10-CM

## 2020-10-26 MED ORDER — LEVALBUTEROL TARTRATE 45 MCG/ACT IN AERO: 1.0000 | INHALATION_SPRAY | Freq: Three times a day (TID) | RESPIRATORY_TRACT | 0 refills | Status: DC | PRN

## 2020-10-26 NOTE — Telephone Encounter (Signed)
PA completed waiting for insurance approval.    Key: [id number]  KP

## 2020-10-26 NOTE — Progress Notes (Signed)
Date:  10/26/2020   Name:  Audrey Hayden   DOB:  Sep 19, 1950   MRN:  [id number]   Chief Complaint: Referral (Pt has really bad allergies wants allergy test, Dr. Orvil Feil, having complications with asthma )  HPI Allergies - she has a long history of allergy sx such as sneezing, cough, PND, nasal congestion and occasional hives.  She takes Claritin daily.  She has been allergy tested in the past - about 10 yrs ago and was only allergic to mould.  She has been diagnosed with bronchiectasis but is not on any medication.  She has tried albuterol inhaler but this causes palpitaitons.  She can not take prednisone due to intolerance with hallucinations and hyperactivity.  She has not tried xopenex MDI.   Lab Results  Component Value Date   CREATININE 1.00 05/02/2019   BUN 18 02/25/2019   NA 137 02/25/2019   K 4.2 02/25/2019   CL 96 02/25/2019   CO2 28 02/25/2019   No results found for: CHOL, HDL, LDLCALC, LDLDIRECT, TRIG, CHOLHDL Lab Results  Component Value Date   TSH 2.130 11/19/2017   No results found for: HGBA1C Lab Results  Component Value Date   WBC 8.8 02/25/2019   HGB 13.8 02/25/2019   HCT 42.2 02/25/2019   MCV 90 02/25/2019   PLT 228 02/25/2019   Lab Results  Component Value Date   ALT 15 02/25/2019   AST 22 02/25/2019   ALKPHOS 92 02/25/2019   BILITOT 0.5 02/25/2019     Review of Systems  Constitutional: Negative for chills, fatigue and fever.  HENT: Positive for congestion, postnasal drip and sneezing.   Respiratory: Positive for cough, chest tightness and shortness of breath. Negative for wheezing.   Cardiovascular: Negative for chest pain, palpitations and leg swelling.  Neurological: Negative for dizziness, light-headedness and headaches.    Patient Active Problem List   Diagnosis Date Noted  . Bronchiectasis ([place]) 08/10/2020  . Asymptomatic spider veins of both lower extremities 02/25/2019  . Chronic left-sided thoracic back pain 12/09/2017  .  Anxiety 12/09/2017  . Environmental and seasonal allergies 06/19/2015  . 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 Osteoporosis 06/19/2015  . Abnormal Pap smear of cervix 06/19/2015  . Elevated HDL 06/19/2015  . History of gastritis 11/08/2014  . Cough, persistent 08/14/2014    Allergies  Allergen Reactions  . Eggs Or Egg-Derived Products Itching and Rash  . Amoxicillin Other (See Comments)    Tingling around the mouth  . Cefdinir     Other reaction(s): Other (See Comments) Bright red face and ankle swelling: possible reaction to the medicine.   . Clarithromycin Other (See Comments)    Causes her to faint.  . Codeine Other (See Comments)    Causes her GI upset.  Marland Kitchen Hydromorphone Hcl Other (See Comments)    Unsure of reaction  . Hydromorphone Hcl Other (See Comments)  . Meperidine Other (See Comments)    Unsure of reaction  . Moxifloxacin Other (See Comments)    Dizziness   . Nsaids     Other reaction(s): Unknown  . Omeprazole Other (See Comments)    Ears ringing with more than 20mg /day  . Other Other (See Comments)    All profins cause her to faint. All the mycins cause her to faint.  . Prednisone Other (See Comments)    Bright red face and ankle swelling: possible reaction to the medicine.  . Pseudoephedrine Hcl     Other  reaction(s): Syncope  . Ranitidine     Other reaction(s): Other (See Comments) SE noted with high dosage Dk urine and stools, HA  . Salicylates Hives  . Aspirin Hives and Rash  . Dexilant [Dexlansoprazole] Rash    Peeling skin  . Doxycycline Other (See Comments)    Headache. "Pressure on the brain''  . Montelukast Sodium Palpitations    Past Surgical History:  Procedure Laterality Date  . BREAST BIOPSY Left    neg  . COLONOSCOPY  2004  . TONSILLECTOMY    . UPPER GI ENDOSCOPY  12/2018    Social History   Tobacco Use  . Smoking status: Former Smoker    Packs/day: 1.50    Years: 5.00    Pack years: 7.50    Types:  Cigarettes    Start date: 39    Quit date: 04/02/1975    Years since quitting: 45.6  . Smokeless tobacco: Never Used  . Tobacco comment: smoking cessation materials not required  Vaping Use  . Vaping Use: Never used  Substance Use Topics  . Alcohol use: No    Alcohol/week: 0.0 standard drinks  . Drug use: No     Medication list has been reviewed and updated.  Current Meds  Medication Sig  . ascorbic acid (VITAMIN C) 500 MG tablet Take by mouth.  Marland Kitchen b complex vitamins tablet Take 1 tablet by mouth 2 (two) times a day.  . calcium citrate-vitamin D (CITRACAL+D) 315-200 MG-UNIT tablet   . Calcium-Phosphorus-Vitamin D (CITRACAL +D3 PO) Take 2 tablets by mouth 2 (two) times daily.  . cetirizine (ZYRTEC) 10 MG tablet Take 10 mg by mouth daily.  Marland Kitchen esomeprazole (NEXIUM) 20 MG capsule Take 1 capsule by mouth 2 (two) times daily.  Marland Kitchen L-Lysine 500 MG TABS   . Magnesium 250 MG TABS Take 1 tablet by mouth daily.  . MULTIPLE VITAMINS PO Take 1 tablet by mouth 2 (two) times daily.  Marland Kitchen PANCREATIN PO Take 1,300 mg by mouth 3 (three) times daily.   . Quercetin 250 MG TABS Take 500 mg by mouth 2 (two) times daily.    PHQ 2/9 Scores 10/26/2020 08/10/2020 07/09/2020 09/12/2019  PHQ - 2 Score 0 4 0 0  PHQ- 9 Score 4 10 - -    GAD 7 : Generalized Anxiety Score 10/26/2020 08/10/2020 11/04/2017  Nervous, Anxious, on Edge 1 2 3   Control/stop worrying 0 2 2  Worry too much - different things 0 2 2  Trouble relaxing 0 2 3  Restless 0 2 3  Easily annoyed or irritable 0 2 1  Afraid - awful might happen 0 0 3  Total GAD 7 Score 1 12 17   Anxiety Difficulty - Somewhat difficult Extremely difficult    BP Readings from Last 3 Encounters:  10/26/20 112/68  08/10/20 112/70  07/09/20 118/78    Physical Exam Vitals and nursing note reviewed.  Constitutional:      General: She is not in acute distress.    Appearance: She is well-developed.  HENT:     Head: Normocephalic and atraumatic.  Pulmonary:      Effort: Pulmonary effort is normal. No respiratory distress.  Skin:    General: Skin is warm and dry.     Findings: No rash.  Neurological:     Mental Status: She is alert and oriented to person, place, and time.  Psychiatric:        Mood and Affect: Mood normal.  Behavior: Behavior normal.     Wt Readings from Last 3 Encounters:  10/26/20 130 lb (59 kg)  08/10/20 128 lb (58.1 kg)  07/09/20 127 lb 9.6 oz (57.9 kg)    BP 112/68   Pulse 69   Temp 98.2 F (36.8 C) (Oral)   Ht 5\' 8"  (1.727 m)   Wt 130 lb (59 kg)   SpO2 98%   BMI 19.77 kg/m   Assessment and Plan: 1. Environmental and seasonal allergies Doubt asthma in view of her recent PFT Will refer to allergy specialist at [facility] per patient request - Ambulatory referral to Allergy  2. Cough, persistent Continue claritin Trial of xopenex mdi - Ambulatory referral to Allergy  3. Bronchiectasis without complication ([facility]) - levalbuterol (XOPENEX HFA) 45 MCG/ACT inhaler; Inhale 1-2 puffs into the lungs every 8 (eight) hours as needed for wheezing.  Dispense: 1 each; Refill: 0   Partially dictated using Editor, commissioning. Any errors are unintentional.  Halina Maidens, MD [facility]  10/26/2020

## 2020-10-29 NOTE — Telephone Encounter (Signed)
Insurance denied coverage.  KP

## 2020-10-31 ENCOUNTER — Telehealth: Payer: Self-pay

## 2020-10-31 DIAGNOSIS — G8929 Other chronic pain: Secondary | ICD-10-CM | POA: Diagnosis not present

## 2020-10-31 DIAGNOSIS — R1013 Epigastric pain: Secondary | ICD-10-CM | POA: Diagnosis not present

## 2020-10-31 DIAGNOSIS — R1084 Generalized abdominal pain: Secondary | ICD-10-CM | POA: Diagnosis not present

## 2020-10-31 DIAGNOSIS — K219 Gastro-esophageal reflux disease without esophagitis: Secondary | ICD-10-CM | POA: Diagnosis not present

## 2020-10-31 DIAGNOSIS — R1310 Dysphagia, unspecified: Secondary | ICD-10-CM | POA: Diagnosis not present

## 2020-10-31 DIAGNOSIS — R1319 Other dysphagia: Secondary | ICD-10-CM | POA: Diagnosis not present

## 2020-10-31 DIAGNOSIS — R109 Unspecified abdominal pain: Secondary | ICD-10-CM | POA: Diagnosis not present

## 2020-10-31 DIAGNOSIS — R140 Abdominal distension (gaseous): Secondary | ICD-10-CM | POA: Diagnosis not present

## 2020-10-31 NOTE — Telephone Encounter (Signed)
Copied from [place] [id number]. Topic: General - Other >> Oct 31, 2020 10:03 AM Leward Quan A wrote: Reason for CRM: Patient called in to inform Dr Army Melia that the medication levalbuterol [facility] HFA) 45 MCG/ACT inhaler prescribed to her caused her to have excelarated heartbeat and nervousness same as the other drug so she can't take that anymore. Also wanted to know if the appointment with  Dr Tiajuana Amass is  set yet. Asking for a call please  Ph# [phone]

## 2020-10-31 NOTE — Telephone Encounter (Signed)
Spoke to pt she stated that she was having abdominal pain. Pt said her stomach "looked like she was pregnant". Told pt to go to the ER or UC. Pt verbalized understanding.  KP

## 2020-10-31 NOTE — Telephone Encounter (Signed)
Please review.  KP

## 2020-10-31 NOTE — Telephone Encounter (Signed)
Called pt left her know that a Rf was placed. Told pt she will get a phone call from Dr. Wynona Meals office to schedule an appt that it can take up to 7 days. Pt verbalized understanding.  KP

## 2020-11-05 ENCOUNTER — Encounter: Payer: Self-pay | Admitting: *Deleted

## 2020-11-27 ENCOUNTER — Other Ambulatory Visit: Payer: Self-pay

## 2020-11-27 ENCOUNTER — Ambulatory Visit (INDEPENDENT_AMBULATORY_CARE_PROVIDER_SITE_OTHER): Payer: Medicare Other | Admitting: Gastroenterology

## 2020-11-27 ENCOUNTER — Encounter: Payer: Self-pay | Admitting: Gastroenterology

## 2020-11-27 VITALS — BP 120/72 | HR 70 | Temp 98.7°F | Ht 68.0 in | Wt 125.0 lb

## 2020-11-27 DIAGNOSIS — R1310 Dysphagia, unspecified: Secondary | ICD-10-CM | POA: Diagnosis not present

## 2020-11-27 DIAGNOSIS — K219 Gastro-esophageal reflux disease without esophagitis: Secondary | ICD-10-CM | POA: Diagnosis not present

## 2020-11-27 NOTE — Progress Notes (Signed)
Audrey Hayden [address]  [place]  [place], [place] [address]  Main: [phone]  Fax: [phone]   Gastroenterology Consultation  Referring Provider:     Glean Hess, MD Primary Care Physician:  Glean Hess, MD Reason for Consultation:     GERD        HPI:    Chief Complaint  Patient presents with  . Gastroesophageal Reflux    Audrey Hayden is a 70 y.o. y/o female referred for consultation & management  by Dr. Army Melia, Jesse Sans, MD.  Patient previously seen with [facility] clinic GI, now presents here for evaluation of acid reflux symptoms.  Her main complaint is cough that she associates with a burning sensation in the back of her throat.  She states she has been evaluated by ENT before and was told everything was normal.  She states this specific symptom was not present before when she has had her previous endoscopic evaluations.  Denies any nausea or vomiting.  Does report intermittent dysphagia specifically to specific foods such as chicken skin.  No episodes of food impaction.  Previous records personally reviewed and as per last GI note from 2020:  "2004: CSY - no polyps per patient. Reportedly diagnosed with IBS. -- 08/03/14: MBSS (indic: dysphagia) - reportedly unremarkable. Labs - Celiac panel, H pylori ab negative. 10/16/14: EGD (indic: dysphagia, heartburn) - Non-obstructing Schatzki ring @ [facility] (dilated and biopsied - mild chronic inflammation). Scattered gastritis in antrum (path - mild reactive gastropathy). Localized gastritis in body (path: focal acute reactive changes). Normal duodenum. 01/25/19: EGD - LA Grade A reflux esophagitis (Dilated. Path - active chronic gastritis of gastric-type mucosa). Two small fundic gland polyps in gastric body. Mild-moderate chronic gastritis in body and antrum. Normal duodenum."  Patient has never had a pH study.  Patient reports last colonoscopy was in 2016 and was normal    Past Medical History:   Diagnosis Date  . Allergy   . Bronchiectasis ([place])   . GERD (gastroesophageal reflux disease)   . Gross hematuria 02/25/2019  . IBS (irritable bowel syndrome)   . Osteoporosis     Past Surgical History:  Procedure Laterality Date  . BREAST BIOPSY Left    neg  . COLONOSCOPY  2004  . TONSILLECTOMY    . UPPER GI ENDOSCOPY  12/2018    Prior to Admission medications   Medication Sig Start Date End Date Taking? Authorizing Provider  ascorbic acid (VITAMIN C) 500 MG tablet Take by mouth.   Yes [provider]  b complex vitamins tablet Take 1 tablet by mouth 2 (two) times a day.   Yes [provider]  calcium citrate-vitamin D (CITRACAL+D) 315-200 MG-UNIT tablet    Yes [provider]  Calcium-Phosphorus-Vitamin D (CITRACAL +D3 PO) Take 2 tablets by mouth 2 (two) times daily.   Yes [provider]  cetirizine (ZYRTEC) 10 MG tablet Take 10 mg by mouth daily.   Yes [provider]  esomeprazole (NEXIUM) 20 MG capsule Take 1 capsule by mouth 2 (two) times daily.   Yes [provider]  PANCREATIN PO Take 1,300 mg by mouth 3 (three) times daily.    Yes [provider]  Quercetin 250 MG TABS Take 500 mg by mouth 2 (two) times daily.   Yes [provider]  levalbuterol (XOPENEX HFA) 45 MCG/ACT inhaler Inhale 1-2 puffs into the lungs every 8 (eight) hours as needed for wheezing. Patient not taking: Reported on 11/27/2020 10/26/20  Glean Hess, MD  Magnesium 250 MG TABS Take 1 tablet by mouth daily. Patient not taking: Reported on 11/27/2020    [provider]    Family History  Problem Relation Age of Onset  . Hypertension Mother   . CAD Father   . Heart attack Father   . Breast cancer Neg Hx   . Colon cancer Neg Hx      Social History   Tobacco Use  . Smoking status: Former Smoker    Packs/day: 1.50    Years: 5.00    Pack years: 7.50    Types: Cigarettes    Start date: 89    Quit date:  04/02/1975    Years since quitting: 45.6  . Smokeless tobacco: Never Used  . Tobacco comment: smoking cessation materials not required  Vaping Use  . Vaping Use: Never used  Substance Use Topics  . Alcohol use: No    Alcohol/week: 0.0 standard drinks  . Drug use: No    Allergies as of 11/27/2020 - Review Complete 11/27/2020  Allergen Reaction Noted  . Eggs or egg-derived products Itching and Rash 07/21/2019  . Amoxicillin Other (See Comments) 09/18/2014  . Cefdinir  09/18/2014  . Clarithromycin Other (See Comments) 09/18/2014  . Codeine Other (See Comments) 09/18/2014  . Hydromorphone hcl Other (See Comments) 09/18/2014  . Hydromorphone hcl Other (See Comments) 06/10/2020  . Meperidine Other (See Comments) 09/18/2014  . Moxifloxacin Other (See Comments) 11/14/2015  . Nsaids  09/18/2014  . Omeprazole Other (See Comments) 06/19/2015  . Other Other (See Comments) 09/18/2014  . Prednisone Other (See Comments) 09/18/2014  . Pseudoephedrine hcl  09/18/2014  . Ranitidine  06/19/2015  . Salicylates Hives 75/91/6384  . Aspirin Hives and Rash 09/18/2014  . Dexilant [dexlansoprazole] Rash 09/12/2019  . Doxycycline Other (See Comments) 09/14/2017  . Montelukast sodium Palpitations 09/18/2014    Review of Systems:    All systems reviewed and negative except where noted in HPI.   Physical Exam:  BP 120/72   Pulse 70   Temp 98.7 F (37.1 C) (Oral)   Ht 5\' 8"  (1.727 m)   Wt 125 lb (56.7 kg)   BMI 19.01 kg/m  No LMP recorded. Patient is postmenopausal. Psych:  Alert and cooperative. Normal mood and affect. General:   Alert,  Well-developed, well-nourished, pleasant and cooperative in NAD Head:  Normocephalic and atraumatic. Eyes:  Sclera clear, no icterus.   Conjunctiva pink. Ears:  Normal auditory acuity. Nose:  No deformity, discharge, or lesions. Mouth:  No deformity or lesions,oropharynx pink & moist. Neck:  Supple; no masses or thyromegaly. Abdomen:  Normal bowel sounds.   No bruits.  Soft, non-tender and non-distended without masses, hepatosplenomegaly or hernias noted.  No guarding or rebound tenderness.    Msk:  Symmetrical without gross deformities. Good, equal movement & strength bilaterally. Pulses:  Normal pulses noted. Extremities:  No clubbing or edema.  No cyanosis. Neurologic:  Alert and oriented x3;  grossly normal neurologically. Skin:  Intact without significant lesions or rashes. No jaundice. Lymph Nodes:  No significant cervical adenopathy. Psych:  Alert and cooperative. Normal mood and affect.   Labs: CBC    Component Value Date/Time   WBC 8.8 02/25/2019 1410   WBC 5.5 09/14/2017 1007   RBC 2 (A) 02/25/2019 1455   RBC 4.68 02/25/2019 1410   RBC 4.04 09/14/2017 1007   HGB 13.8 02/25/2019 1410   HCT 42.2 02/25/2019 1410   PLT 228 02/25/2019 1410   MCV  90 02/25/2019 1410   MCV 91 12/05/2013 0800   MCH 29.5 02/25/2019 1410   MCH 31.3 09/14/2017 1007   MCHC 32.7 02/25/2019 1410   MCHC 34.5 09/14/2017 1007   RDW 12.0 02/25/2019 1410   RDW 12.8 12/05/2013 0800   LYMPHSABS 1.4 02/25/2019 1410   MONOABS 0.5 09/14/2017 1007   EOSABS 0.1 02/25/2019 1410   BASOSABS 0.0 02/25/2019 1410   CMP     Component Value Date/Time   NA 137 02/25/2019 1410   NA 138 12/05/2013 0800   K 4.2 02/25/2019 1410   K 3.6 12/05/2013 0800   CL 96 02/25/2019 1410   CL 105 12/05/2013 0800   CO2 28 02/25/2019 1410   CO2 28 12/05/2013 0800   GLUCOSE 87 02/25/2019 1410   GLUCOSE 113 (H) 09/14/2017 1007   GLUCOSE 111 (H) 12/05/2013 0800   BUN 18 02/25/2019 1410   BUN 9 12/05/2013 0800   CREATININE 1.00 05/02/2019 1519   CREATININE 0.82 12/05/2013 0800   CALCIUM 9.7 02/25/2019 1410   CALCIUM 8.5 12/05/2013 0800   PROT 6.9 02/25/2019 1410   PROT 6.6 12/05/2013 0800   ALBUMIN 4.5 02/25/2019 1410   ALBUMIN 3.4 12/05/2013 0800   AST 22 02/25/2019 1410   AST 29 12/05/2013 0800   ALT 15 02/25/2019 1410   ALT 27 12/05/2013 0800   ALKPHOS 92 02/25/2019  1410   ALKPHOS 66 12/05/2013 0800   BILITOT 0.5 02/25/2019 1410   BILITOT 1.4 (H) 12/05/2013 0800   GFRNONAA 63 02/25/2019 1410   GFRNONAA >60 12/05/2013 0800   GFRAA 72 02/25/2019 1410   GFRAA >60 12/05/2013 0800    Imaging Studies: No results found.  Assessment and Plan:   Allesha Aronoff is a 70 y.o. y/o female has been referred for GERD  Please see previous GI notes for extensive work-up in the past.  However, patient is reporting new cough and dysphagia that was not present as per her at the time of her previous upper endoscopies.  Therefore, EGD indicated for evaluation of new dysphagia and GERD  If unrevealing, would recommend pH monitoring with manometry  I have discussed alternative options, risks & benefits,  which include, but are not limited to, bleeding, infection, perforation,respiratory complication & drug reaction.  The patient agrees with this plan & written consent will be obtained.    Patient states she has been through all the PPIs in the past and is currently on Nexium  (Risks of PPI use were discussed with patient including bone loss, C. Diff diarrhea, pneumonia, infections, CKD, electrolyte abnormalities.  Pt. Verbalizes understanding and chooses to continue the medication.)  Patient educated extensively on acid reflux lifestyle modification, including buying a bed wedge, not eating 3 hrs before bedtime, diet modifications, and handout given for the same.      Dr Audrey Hayden  Speech recognition software was used to dictate the above note.

## 2020-11-29 ENCOUNTER — Telehealth: Payer: Self-pay | Admitting: Gastroenterology

## 2020-11-29 NOTE — Telephone Encounter (Signed)
Patient called, asking for call back to answer multiple questions about what meds she can take before her scheduled procedure .  Please call patient

## 2020-11-29 NOTE — Telephone Encounter (Signed)
Called patient back to let her know that she should hold off her medications the morning of but to make sure to please call the endoscopy unit number to make sure at [phone]. Patient understood and stated that she would call to make sure. Patient then had no further questions for me.

## 2020-11-30 ENCOUNTER — Ambulatory Visit: Payer: Medicare Other

## 2020-12-03 ENCOUNTER — Other Ambulatory Visit: Payer: Self-pay

## 2020-12-03 ENCOUNTER — Other Ambulatory Visit
Admission: RE | Admit: 2020-12-03 | Discharge: 2020-12-03 | Disposition: A | Discharge status: Home / Self Care | Payer: Medicare Other | Source: Ambulatory Visit | Attending: Gastroenterology | Admitting: Gastroenterology

## 2020-12-03 DIAGNOSIS — Z20822 Contact with and (suspected) exposure to covid-19: Secondary | ICD-10-CM | POA: Diagnosis not present

## 2020-12-03 DIAGNOSIS — Z01812 Encounter for preprocedural laboratory examination: Secondary | ICD-10-CM | POA: Insufficient documentation

## 2020-12-03 LAB — SARS CORONAVIRUS 2 (TAT 6-24 HRS): SARS Coronavirus 2: NEGATIVE

## 2020-12-05 ENCOUNTER — Encounter: Payer: Self-pay | Admitting: Gastroenterology

## 2020-12-05 ENCOUNTER — Encounter
Admission: RE | Disposition: A | Discharge status: Home / Self Care | Payer: Self-pay | Source: Home / Self Care | Attending: Gastroenterology

## 2020-12-05 ENCOUNTER — Ambulatory Visit: Payer: Medicare Other | Admitting: Anesthesiology

## 2020-12-05 ENCOUNTER — Other Ambulatory Visit: Payer: Self-pay

## 2020-12-05 ENCOUNTER — Ambulatory Visit
Admission: RE | Admit: 2020-12-05 | Discharge: 2020-12-05 | Disposition: A | Discharge status: Home / Self Care | Payer: Medicare Other | Attending: Gastroenterology | Admitting: Gastroenterology

## 2020-12-05 ENCOUNTER — Ambulatory Visit: Payer: Medicare Other | Admitting: Gastroenterology

## 2020-12-05 DIAGNOSIS — Z8249 Family history of ischemic heart disease and other diseases of the circulatory system: Secondary | ICD-10-CM | POA: Diagnosis not present

## 2020-12-05 DIAGNOSIS — K589 Irritable bowel syndrome without diarrhea: Secondary | ICD-10-CM | POA: Diagnosis not present

## 2020-12-05 DIAGNOSIS — Z880 Allergy status to penicillin: Secondary | ICD-10-CM | POA: Insufficient documentation

## 2020-12-05 DIAGNOSIS — K319 Disease of stomach and duodenum, unspecified: Secondary | ICD-10-CM

## 2020-12-05 DIAGNOSIS — Z881 Allergy status to other antibiotic agents status: Secondary | ICD-10-CM | POA: Insufficient documentation

## 2020-12-05 DIAGNOSIS — Z886 Allergy status to analgesic agent status: Secondary | ICD-10-CM | POA: Insufficient documentation

## 2020-12-05 DIAGNOSIS — K3189 Other diseases of stomach and duodenum: Secondary | ICD-10-CM | POA: Diagnosis not present

## 2020-12-05 DIAGNOSIS — Z885 Allergy status to narcotic agent status: Secondary | ICD-10-CM | POA: Insufficient documentation

## 2020-12-05 DIAGNOSIS — R1319 Other dysphagia: Secondary | ICD-10-CM

## 2020-12-05 DIAGNOSIS — Z888 Allergy status to other drugs, medicaments and biological substances status: Secondary | ICD-10-CM | POA: Insufficient documentation

## 2020-12-05 DIAGNOSIS — K222 Esophageal obstruction: Secondary | ICD-10-CM

## 2020-12-05 DIAGNOSIS — Z79899 Other long term (current) drug therapy: Secondary | ICD-10-CM | POA: Insufficient documentation

## 2020-12-05 DIAGNOSIS — K317 Polyp of stomach and duodenum: Secondary | ICD-10-CM | POA: Insufficient documentation

## 2020-12-05 DIAGNOSIS — R1310 Dysphagia, unspecified: Secondary | ICD-10-CM | POA: Diagnosis present

## 2020-12-05 DIAGNOSIS — K449 Diaphragmatic hernia without obstruction or gangrene: Secondary | ICD-10-CM | POA: Insufficient documentation

## 2020-12-05 DIAGNOSIS — K219 Gastro-esophageal reflux disease without esophagitis: Secondary | ICD-10-CM | POA: Insufficient documentation

## 2020-12-05 DIAGNOSIS — Z87891 Personal history of nicotine dependence: Secondary | ICD-10-CM | POA: Insufficient documentation

## 2020-12-05 DIAGNOSIS — K2289 Other specified disease of esophagus: Secondary | ICD-10-CM | POA: Insufficient documentation

## 2020-12-05 HISTORY — PX: ESOPHAGOGASTRODUODENOSCOPY (EGD) WITH PROPOFOL: SHX5813

## 2020-12-05 SURGERY — ESOPHAGOGASTRODUODENOSCOPY (EGD) WITH PROPOFOL
Anesthesia: General

## 2020-12-05 MED ORDER — PROPOFOL 10 MG/ML IV BOLUS
INTRAVENOUS | Status: AC
  Filled 2020-12-05: qty 40

## 2020-12-05 MED ORDER — PROPOFOL 10 MG/ML IV BOLUS
INTRAVENOUS | Status: DC | PRN
  Administered 2020-12-05: 60 mg via INTRAVENOUS

## 2020-12-05 MED ORDER — GLYCOPYRROLATE 0.2 MG/ML IJ SOLN
INTRAMUSCULAR | Status: DC | PRN
  Administered 2020-12-05: .2 mg via INTRAVENOUS

## 2020-12-05 MED ORDER — LIDOCAINE HCL (CARDIAC) PF 100 MG/5ML IV SOSY
PREFILLED_SYRINGE | INTRAVENOUS | Status: DC | PRN
  Administered 2020-12-05: 100 mg via INTRAVENOUS

## 2020-12-05 MED ORDER — PROPOFOL 500 MG/50ML IV EMUL
INTRAVENOUS | Status: DC | PRN
  Administered 2020-12-05: 150 ug/kg/min via INTRAVENOUS

## 2020-12-05 MED ORDER — SODIUM CHLORIDE 0.9 % IV SOLN: INTRAVENOUS | Status: DC

## 2020-12-05 MED ORDER — PROPOFOL 500 MG/50ML IV EMUL
INTRAVENOUS | Status: AC
  Filled 2020-12-05: qty 200

## 2020-12-05 NOTE — Transfer of Care (Signed)
Immediate Anesthesia Transfer of Care Note  Patient: Audrey Hayden  Procedure(s) Performed: ESOPHAGOGASTRODUODENOSCOPY (EGD) WITH PROPOFOL (N/A )  Patient Location: Endoscopy Unit  Anesthesia Type:General  Level of Consciousness: awake, drowsy and patient cooperative  Airway & Oxygen Therapy: Patient Spontanous Breathing and Patient connected to face mask oxygen  Post-op Assessment: Report given to RN and Post -op Vital signs reviewed and stable  Post vital signs: Reviewed and stable  Last Vitals:  Vitals Value Taken Time  BP 113/62 12/05/20 0900  Temp 36.4 C 12/05/20 0900  Pulse 76 12/05/20 0902  Resp 23 12/05/20 0902  SpO2 100 % 12/05/20 0902  Vitals shown include unvalidated device data.  Last Pain:  Vitals:   12/05/20 0900  TempSrc: Temporal  PainSc: Asleep         Complications: No complications documented.

## 2020-12-05 NOTE — Anesthesia Preprocedure Evaluation (Signed)
Anesthesia Evaluation  Patient identified by MRN, date of birth, ID band Patient awake    Reviewed: Allergy & Precautions, NPO status , Patient's Chart, lab work & pertinent test results  History of Anesthesia Complications Negative for: history of anesthetic complications  Airway Mallampati: II       Dental   Pulmonary neg sleep apnea, neg COPD, Not current smoker, former smoker,           Cardiovascular (-) hypertension(-) Past MI and (-) CHF (-) dysrhythmias (-) Valvular Problems/Murmurs     Neuro/Psych neg Seizures Anxiety    GI/Hepatic Neg liver ROS, GERD  Medicated,  Endo/Other  neg diabetes  Renal/GU negative Renal ROS     Musculoskeletal   Abdominal   Peds  Hematology   Anesthesia Other Findings   Reproductive/Obstetrics                             Anesthesia Physical Anesthesia Plan  ASA: II  Anesthesia Plan: General   Post-op Pain Management:    Induction: Intravenous  PONV Risk Score and Plan: 3 and Propofol infusion and TIVA  Airway Management Planned: Nasal Cannula  Additional Equipment:   Intra-op Plan:   Post-operative Plan:   Informed Consent: I have reviewed the patients History and Physical, chart, labs and discussed the procedure including the risks, benefits and alternatives for the proposed anesthesia with the patient or authorized representative who has indicated his/her understanding and acceptance.       Plan Discussed with:   Anesthesia Plan Comments:         Anesthesia Quick Evaluation

## 2020-12-05 NOTE — Op Note (Signed)
[facility] Gastroenterology Patient Name: Audrey Hayden Procedure Date: 12/05/2020 8:14 AM MRN: [id number] Account #: [id number] Date of Birth: November 11, 1950 Admit Type: Outpatient Age: 70 Room: [facility] ENDO ROOM 3 Gender: Female Note Status: Finalized Procedure:             Upper GI endoscopy Indications:           Dysphagia Providers:             Johanan Skorupski B. Bonna Gains MD, MD Referring MD:          Halina Maidens, MD (Referring MD) Medicines:             Monitored Anesthesia Care Complications:         No immediate complications. Procedure:             Pre-Anesthesia Assessment:                        - Prior to the procedure, a History and Physical was                         performed, and patient medications, allergies and                         sensitivities were reviewed. The patient's tolerance                         of previous anesthesia was reviewed.                        - The risks and benefits of the procedure and the                         sedation options and risks were discussed with the                         patient. All questions were answered and informed                         consent was obtained.                        - Patient identification and proposed procedure were                         verified prior to the procedure by the physician, the                         nurse, the anesthesiologist, the anesthetist and the                         technician. The procedure was verified in the                         procedure room.                        - ASA Grade Assessment: II - A patient with mild                         systemic disease.  After obtaining informed consent, the endoscope was                         passed under direct vision. Throughout the procedure,                         the patient's blood pressure, pulse, and oxygen                         saturations were monitored continuously. The Endoscope                          was introduced through the mouth, and advanced to the                         second part of duodenum. The upper GI endoscopy was                         accomplished with ease. The patient tolerated the                         procedure well. Findings:      A moderate Schatzki ring was found at the gastroesophageal junction. A       TTS dilator was passed through the scope. Dilation with an 18-19-20 mm       balloon dilator was performed to 20 mm. The dilation site was examined       and showed complete resolution of luminal narrowing.      A single 5 mm nodule was found in the distal esophagus. Biopsies were       taken with a cold forceps for histology. The biopsies were taken after       dilation to ensure there is no interference or complications with       dilation. The biopsy site did not interfere with or affect the dilation       site in any way. There were no tears or complications from the biopsies.       Biopsies were obtained from the proximal and distal esophagus with cold       forceps for histology of suspected eosinophilic esophagitis. Biopsies       for EoE were also performed away from and more proximal to the area of       dilation.      The exam of the esophagus was otherwise normal.      Patchy mildly erythematous mucosa without bleeding was found in the       gastric antrum. Biopsies were taken with a cold forceps for histology.       Biopsies were obtained in the gastric body, at the incisura and in the       gastric antrum with cold forceps for histology.      A single 3 mm erosion with no bleeding and no stigmata of recent       bleeding was found in the gastric antrum. Biopsies were taken with a       cold forceps for histology.      Multiple 4 to 6 mm sessile polyps with no stigmata of recent bleeding       were found in the gastric fundus and in the gastric body. Biopsies were  taken with a cold forceps for histology.      A  small hiatal hernia was present.      The exam of the stomach was otherwise normal.      Patchy mild mucosal changes characterized by discoloration were found in       the second portion of the duodenum. Biopsies were taken with a cold       forceps for histology.      The exam of the duodenum was otherwise normal. Impression:            - Moderate Schatzki ring. Dilated.                        - Nodule found in the esophagus. Biopsied.                        - Erythematous mucosa in the antrum. Biopsied.                        - Erosive gastropathy with no bleeding and no stigmata                         of recent bleeding. Biopsied.                        - Multiple gastric polyps. Biopsied.                        - Small hiatal hernia.                        - Mucosal changes in the duodenum. Biopsied.                        - Biopsies were obtained in the gastric body, at the                         incisura and in the gastric antrum. Recommendation:        - Await pathology results.                        - Discharge patient to home (with escort).                        - Advance diet as tolerated.                        - Continue present medications.                        - Patient has a contact number available for                         emergencies. The signs and symptoms of potential                         delayed complications were discussed with the patient.                         Return to normal activities tomorrow. Written  discharge instructions were provided to the patient.                        - Discharge patient to home (with escort).                        - The findings and recommendations were discussed with                         the patient.                        - The findings and recommendations were discussed with                         the patient's family.                        - Follow an antireflux regimen.                         - Take prescribed proton pump inhibitor or H2 blocker                         (antacid) medications 30 - 60 minutes before meals. Procedure Code(s):     --- Professional ---                        [phone], Esophagogastroduodenoscopy, flexible,                         transoral; with transendoscopic balloon dilation of                         esophagus (less than 30 mm diameter)                        43239, 59, Esophagogastroduodenoscopy, flexible,                         transoral; with biopsy, single or multiple Diagnosis Code(s):     --- Professional ---                        K22.2, Esophageal obstruction                        K22.8, Other specified diseases of esophagus                        K31.89, Other diseases of stomach and duodenum                        K31.7, Polyp of stomach and duodenum                        K44.9, Diaphragmatic hernia without obstruction or                         gangrene                        R13.10, Dysphagia, unspecified  CPT copyright 2019 [employer] Association. All rights reserved. The codes documented in this report are preliminary and upon coder review may  be revised to meet current compliance requirements.  Vonda Antigua, MD Margretta Sidle B. Bonna Gains MD, MD 12/05/2020 9:09:05 AM This report has been signed electronically. Number of Addenda: 0 Note Initiated On: 12/05/2020 8:14 AM Estimated Blood Loss:  Estimated blood loss: none.      [facility]

## 2020-12-05 NOTE — Anesthesia Postprocedure Evaluation (Signed)
Anesthesia Post Note  Patient: Audrey Hayden  Procedure(s) Performed: ESOPHAGOGASTRODUODENOSCOPY (EGD) WITH PROPOFOL (N/A )  Patient location during evaluation: Endoscopy Anesthesia Type: General Level of consciousness: awake and alert Pain management: pain level controlled Vital Signs Assessment: post-procedure vital signs reviewed and stable Respiratory status: spontaneous breathing and respiratory function stable Cardiovascular status: stable Anesthetic complications: no   No complications documented.   Last Vitals:  Vitals:   12/05/20 0900 12/05/20 0910  BP:  105/60  Pulse:    Resp:    Temp: 36.4 C   SpO2:  100%    Last Pain:  Vitals:   12/05/20 0920  TempSrc:   PainSc: 0-No pain                 Kalifa Cadden K

## 2020-12-05 NOTE — H&P (Signed)
Audrey Antigua, MD [address], [place], [place], [place], [address] [address], [place], [place], [place], [address] Phone: [phone]  Fax: [phone]  Primary Care Physician:  Glean Hess, MD   Pre-Procedure History & Physical: HPI:  Audrey Hayden is a 70 y.o. female is here for an EGD.   Past Medical History:  Diagnosis Date  . Allergy   . Bronchiectasis ([place])   . GERD (gastroesophageal reflux disease)   . Gross hematuria 02/25/2019  . IBS (irritable bowel syndrome)   . Osteoporosis     Past Surgical History:  Procedure Laterality Date  . BREAST BIOPSY Left    neg  . COLONOSCOPY  2004  . TONSILLECTOMY    . UPPER GI ENDOSCOPY  12/2018    Prior to Admission medications   Medication Sig Start Date End Date Taking? Authorizing Provider  ascorbic acid (VITAMIN C) 500 MG tablet Take by mouth.   Yes [provider]  b complex vitamins tablet Take 1 tablet by mouth 2 (two) times a day.   Yes [provider]  calcium citrate-vitamin D (CITRACAL+D) 315-200 MG-UNIT tablet    Yes [provider]  Calcium-Phosphorus-Vitamin D (CITRACAL +D3 PO) Take 2 tablets by mouth 2 (two) times daily.   Yes [provider]  cetirizine (ZYRTEC) 10 MG tablet Take 10 mg by mouth daily.   Yes [provider]  esomeprazole (NEXIUM) 20 MG capsule Take 1 capsule by mouth 2 (two) times daily.   Yes [provider]  Magnesium 250 MG TABS Take 1 tablet by mouth daily.   Yes [provider]  PANCREATIN PO Take 1,300 mg by mouth 3 (three) times daily.    Yes [provider]  Quercetin 250 MG TABS Take 500 mg by mouth 2 (two) times daily.   Yes [provider]  levalbuterol (XOPENEX HFA) 45 MCG/ACT inhaler Inhale 1-2 puffs into the lungs every 8 (eight) hours as needed for wheezing. Patient not taking: Reported on 11/27/2020 10/26/20   Glean Hess, MD    Allergies as of 11/27/2020 - Review Complete  11/27/2020  Allergen Reaction Noted  . Eggs or egg-derived products Itching and Rash 07/21/2019  . Amoxicillin Other (See Comments) 09/18/2014  . Cefdinir  09/18/2014  . Clarithromycin Other (See Comments) 09/18/2014  . Codeine Other (See Comments) 09/18/2014  . Hydromorphone hcl Other (See Comments) 09/18/2014  . Hydromorphone hcl Other (See Comments) 06/10/2020  . Meperidine Other (See Comments) 09/18/2014  . Moxifloxacin Other (See Comments) 11/14/2015  . Nsaids  09/18/2014  . Omeprazole Other (See Comments) 06/19/2015  . Other Other (See Comments) 09/18/2014  . Prednisone Other (See Comments) 09/18/2014  . Pseudoephedrine hcl  09/18/2014  . Ranitidine  06/19/2015  . Salicylates Hives 24/58/0998  . Aspirin Hives and Rash 09/18/2014  . Dexilant [dexlansoprazole] Rash 09/12/2019  . Doxycycline Other (See Comments) 09/14/2017  . Montelukast sodium Palpitations 09/18/2014    Family History  Problem Relation Age of Onset  . Hypertension Mother   . CAD Father   . Heart attack Father   . Breast cancer Neg Hx   . Colon cancer Neg Hx     Social History   Socioeconomic History  . Marital status: Married    Spouse name: Not on file  . Number of children: 2  . Years of education: Not on file  . Highest education level: Bachelor's degree (e.g., BA, AB, BS)  Occupational History  . Occupation: Retired  Tobacco Use  .  Smoking status: Former Smoker    Packs/day: 1.50    Years: 5.00    Pack years: 7.50    Types: Cigarettes    Start date: 89    Quit date: 04/02/1975    Years since quitting: 45.7  . Smokeless tobacco: Never Used  . Tobacco comment: smoking cessation materials not required  Vaping Use  . Vaping Use: Never used  Substance and Sexual Activity  . Alcohol use: No    Alcohol/week: 0.0 standard drinks  . Drug use: No  . Sexual activity: Not Currently  Other Topics Concern  . Not on file  Social History Narrative  . Not on file   Social Determinants of  Health   Financial Resource Strain: Low Risk   . Difficulty of Paying Living Expenses: Not hard at all  Food Insecurity: No Food Insecurity  . Worried About Charity fundraiser in the Last Year: Never true  . Ran Out of Food in the Last Year: Never true  Transportation Needs: No Transportation Needs  . Lack of Transportation (Medical): No  . Lack of Transportation (Non-Medical): No  Physical Activity: Sufficiently Active  . Days of Exercise per Week: 7 days  . Minutes of Exercise per Session: 30 min  Stress: No Stress Concern Present  . Feeling of Stress : Only a little  Social Connections: Moderately Integrated  . Frequency of Communication with Friends and Family: More than three times a week  . Frequency of Social Gatherings with Friends and Family: Three times a week  . Attends Religious Services: More than 4 times per year  . Active Member of Clubs or Organizations: No  . Attends Archivist Meetings: Never  . Marital Status: Married  Human resources officer Violence: Not At Risk  . Fear of Current or Ex-Partner: No  . Emotionally Abused: No  . Physically Abused: No  . Sexually Abused: No    Review of Systems: See HPI, otherwise negative ROS  Physical Exam: BP 129/73   Pulse 70   Temp (!) 97 F (36.1 C) (Temporal)   Resp 16   Ht 5\' 8"  (1.727 m)   Wt 55.8 kg   SpO2 99%   BMI 18.70 kg/m  General:   Alert,  pleasant and cooperative in NAD Head:  Normocephalic and atraumatic. Neck:  Supple; no masses or thyromegaly. Lungs:  Clear throughout to auscultation, normal respiratory effort.    Heart:  +S1, +S2, Regular rate and rhythm, No edema. Abdomen:  Soft, nontender and nondistended. Normal bowel sounds, without guarding, and without rebound.   Neurologic:  Alert and  oriented x4;  grossly normal neurologically.  Impression/Plan: Audrey Hayden is here for an EGD for dysphagia  Risks, benefits, limitations, and alternatives regarding the procedure have been  reviewed with the patient.  Questions have been answered.  All parties agreeable.   Virgel Manifold, MD  12/05/2020, 8:29 AM

## 2020-12-05 NOTE — Anesthesia Procedure Notes (Signed)
Procedure Name: General with mask airway Performed by: Fletcher-Harrison, Ura Yingling, CRNA Pre-anesthesia Checklist: Patient identified, Emergency Drugs available, Suction available and Patient being monitored Patient Re-evaluated:Patient Re-evaluated prior to induction Oxygen Delivery Method: Simple face mask Induction Type: IV induction Placement Confirmation: positive ETCO2 and CO2 detector Dental Injury: Teeth and Oropharynx as per pre-operative assessment        

## 2020-12-06 ENCOUNTER — Encounter: Payer: Self-pay | Admitting: Gastroenterology

## 2020-12-06 LAB — SURGICAL PATHOLOGY

## 2020-12-07 ENCOUNTER — Encounter: Payer: Self-pay | Admitting: Gastroenterology

## 2020-12-21 ENCOUNTER — Other Ambulatory Visit: Payer: Self-pay

## 2020-12-31 ENCOUNTER — Ambulatory Visit: Payer: Medicare Other

## 2021-01-09 ENCOUNTER — Other Ambulatory Visit: Payer: Self-pay

## 2021-01-09 ENCOUNTER — Ambulatory Visit (INDEPENDENT_AMBULATORY_CARE_PROVIDER_SITE_OTHER): Payer: Medicare Other | Admitting: Gastroenterology

## 2021-01-09 ENCOUNTER — Encounter: Payer: Self-pay | Admitting: Gastroenterology

## 2021-01-09 VITALS — BP 121/67 | HR 76 | Temp 97.6°F | Ht 68.0 in | Wt 121.0 lb

## 2021-01-09 DIAGNOSIS — K219 Gastro-esophageal reflux disease without esophagitis: Secondary | ICD-10-CM | POA: Diagnosis not present

## 2021-01-09 DIAGNOSIS — R1319 Other dysphagia: Secondary | ICD-10-CM

## 2021-01-09 NOTE — Progress Notes (Signed)
Audrey Antigua, MD [address]  [place]  [place], [place] [address]  Main: [phone]  Fax: [phone]   Primary Care Physician: Glean Hess, MD   Chief Complaint  Patient presents with  . Dysphagia    HPI: Audrey Hayden is a 70 y.o. female here for follow-up of dysphagia.  Dysphagia has resolved since dilation of Schatzki's ring.  Patient has now Nexium once daily and states her heartburn is well controlled with this as well.  She does have a small hiatal hernia and given her chronic reflux requiring Nexium, we did discuss referral for this as well.  No abdominal pain, nausea or vomiting, weight loss  Current Outpatient Medications  Medication Sig Dispense Refill  . b complex vitamins tablet Take 1 tablet by mouth 2 (two) times a day.    . calcium citrate-vitamin D (CITRACAL+D) 315-200 MG-UNIT tablet     . cetirizine (ZYRTEC) 10 MG tablet Take 10 mg by mouth daily.    Audrey Kitchen esomeprazole (NEXIUM) 20 MG capsule Take 1 capsule by mouth 2 (two) times daily.    Audrey Kitchen PANCREATIN PO Take 1,300 mg by mouth 3 (three) times daily.     . Quercetin 250 MG TABS Take 500 mg by mouth 2 (two) times daily.    Audrey Kitchen ascorbic acid (VITAMIN C) 500 MG tablet Take by mouth. (Patient not taking: Reported on 01/09/2021)    . Calcium-Phosphorus-Vitamin D (CITRACAL +D3 PO) Take 2 tablets by mouth 2 (two) times daily. (Patient not taking: Reported on 01/09/2021)    . levalbuterol (XOPENEX HFA) 45 MCG/ACT inhaler Inhale 1-2 puffs into the lungs every 8 (eight) hours as needed for wheezing. (Patient not taking: Reported on 01/09/2021) 1 each 0  . Magnesium 250 MG TABS Take 1 tablet by mouth daily. (Patient not taking: Reported on 01/09/2021)     No current facility-administered medications for this visit.    Allergies as of 01/09/2021 - Review Complete 01/09/2021  Allergen Reaction Noted  . Eggs or egg-derived products Itching and Rash 07/21/2019  . Amoxicillin Other (See Comments) 09/18/2014   . Cefdinir  09/18/2014  . Clarithromycin Other (See Comments) 09/18/2014  . Codeine Other (See Comments) 09/18/2014  . Hydromorphone hcl Other (See Comments) 09/18/2014  . Hydromorphone hcl Other (See Comments) 06/10/2020  . Meperidine Other (See Comments) 09/18/2014  . Moxifloxacin Other (See Comments) 11/14/2015  . Nsaids  09/18/2014  . Omeprazole Other (See Comments) 06/19/2015  . Other Other (See Comments) 09/18/2014  . Prednisone Other (See Comments) 09/18/2014  . Pseudoephedrine hcl  09/18/2014  . Ranitidine  06/19/2015  . Salicylates Hives 40/06/2724  . Aspirin Hives and Rash 09/18/2014  . Dexilant [dexlansoprazole] Rash 09/12/2019  . Doxycycline Other (See Comments) 09/14/2017  . Montelukast sodium Palpitations 09/18/2014    ROS:  General: Negative for anorexia, weight loss, fever, chills, fatigue, weakness. ENT: Negative for hoarseness, difficulty swallowing , nasal congestion. CV: Negative for chest pain, angina, palpitations, dyspnea on exertion, peripheral edema.  Respiratory: Negative for dyspnea at rest, dyspnea on exertion, cough, sputum, wheezing.  GI: See history of present illness. GU:  Negative for dysuria, hematuria, urinary incontinence, urinary frequency, nocturnal urination.  Endo: Negative for unusual weight change.    Physical Examination:   BP 121/67   Pulse 76   Temp 97.6 F (36.4 C) (Oral)   Ht 5\' 8"  (1.727 m)   Wt 121 lb (54.9 kg)   BMI 18.40 kg/m   General: Well-nourished, well-developed in no acute  distress.  Eyes: No icterus. Conjunctivae pink. Mouth: Oropharyngeal mucosa moist and pink , no lesions erythema or exudate. Neck: Supple, Trachea midline Abdomen: Bowel sounds are normal, nontender, nondistended, no hepatosplenomegaly or masses, no abdominal bruits or hernia , no rebound or guarding.   Extremities: No lower extremity edema. No clubbing or deformities. Neuro: Alert and oriented x 3.  Grossly intact. Skin: Warm and dry, no  jaundice.   Psych: Alert and cooperative, normal mood and affect.   Labs: CMP     Component Value Date/Time   NA 137 02/25/2019 1410   NA 138 12/05/2013 0800   K 4.2 02/25/2019 1410   K 3.6 12/05/2013 0800   CL 96 02/25/2019 1410   CL 105 12/05/2013 0800   CO2 28 02/25/2019 1410   CO2 28 12/05/2013 0800   GLUCOSE 87 02/25/2019 1410   GLUCOSE 113 (H) 09/14/2017 1007   GLUCOSE 111 (H) 12/05/2013 0800   BUN 18 02/25/2019 1410   BUN 9 12/05/2013 0800   CREATININE 1.00 05/02/2019 1519   CREATININE 0.82 12/05/2013 0800   CALCIUM 9.7 02/25/2019 1410   CALCIUM 8.5 12/05/2013 0800   PROT 6.9 02/25/2019 1410   PROT 6.6 12/05/2013 0800   ALBUMIN 4.5 02/25/2019 1410   ALBUMIN 3.4 12/05/2013 0800   AST 22 02/25/2019 1410   AST 29 12/05/2013 0800   ALT 15 02/25/2019 1410   ALT 27 12/05/2013 0800   ALKPHOS 92 02/25/2019 1410   ALKPHOS 66 12/05/2013 0800   BILITOT 0.5 02/25/2019 1410   BILITOT 1.4 (H) 12/05/2013 0800   GFRNONAA 63 02/25/2019 1410   GFRNONAA >60 12/05/2013 0800   GFRAA 72 02/25/2019 1410   GFRAA >60 12/05/2013 0800   Lab Results  Component Value Date   WBC 8.8 02/25/2019   HGB 13.8 02/25/2019   HCT 42.2 02/25/2019   MCV 90 02/25/2019   PLT 228 02/25/2019    Imaging Studies: No results found.  Assessment and Plan:   Audrey Hayden is a 70 y.o. y/o female here for follow-up of dysphagia  Dysphagia resolved with dilation of Schatzki's ring Nexium once daily controls her heartburn well  As per literature, clinical trials have also showed better results as far as recurrence of schtazki ring with patient's on PPI than without  I did Discuss with her she can try to decrease medication after about 2 months and then discontinue if no further symptoms and she is willing to try this, but is not like to decrease it right now or abruptly as she reports having severe symptoms when she tried to stop it before  If symptoms worsen or recur patient advised to call  us back  (Risks of PPI use were discussed with patient including bone loss, C. Diff diarrhea, pneumonia, infections, CKD, electrolyte abnormalities.  Pt. Verbalizes understanding and chooses to continue the medication.)  Patient educated extensively on acid reflux lifestyle modification, including buying a bed wedge, not eating 3 hrs before bedtime, diet modifications, and handout given for the same.     Dr Audrey Hayden

## 2021-01-18 DIAGNOSIS — J3089 Other allergic rhinitis: Secondary | ICD-10-CM | POA: Diagnosis not present

## 2021-01-18 DIAGNOSIS — T781XXD Other adverse food reactions, not elsewhere classified, subsequent encounter: Secondary | ICD-10-CM | POA: Diagnosis not present

## 2021-02-01 DIAGNOSIS — E041 Nontoxic single thyroid nodule: Secondary | ICD-10-CM | POA: Diagnosis not present

## 2021-02-01 DIAGNOSIS — R634 Abnormal weight loss: Secondary | ICD-10-CM | POA: Diagnosis not present

## 2021-02-06 ENCOUNTER — Other Ambulatory Visit: Payer: Self-pay

## 2021-02-06 ENCOUNTER — Ambulatory Visit (INDEPENDENT_AMBULATORY_CARE_PROVIDER_SITE_OTHER): Payer: Medicare Other | Admitting: Gastroenterology

## 2021-02-06 VITALS — BP 115/76 | HR 69 | Temp 97.7°F | Wt 116.2 lb

## 2021-02-06 DIAGNOSIS — R634 Abnormal weight loss: Secondary | ICD-10-CM | POA: Diagnosis not present

## 2021-02-06 DIAGNOSIS — R1319 Other dysphagia: Secondary | ICD-10-CM

## 2021-02-06 NOTE — Progress Notes (Signed)
Audrey Antigua, MD [address]  [place]  [place], [place] [address]  Main: [phone]  Fax: [phone]   Primary Care Physician: Glean Hess, MD   Chief complaint: Weight loss  HPI: Audrey Hayden is a 70 y.o. female previously seen for dysphagia here for follow-up.  Patient reports complete resolution of dysphagia symptoms since EGD with dilation.  Denies any abdominal pain but reports unintentional weight loss.  Patient is 116 pounds today and was 121 pounds on last clinic visit a month ago.  States that when she gets stressed, her weight tends to drop and recently over the last 2 years she has had increased stressors.  No depression or suicidal ideation.  No nausea or vomiting.  On last visit patient stated that she had a colonoscopy in 2016, but now states that she did not have a colonoscopy in 2016 and the last one was in 2004.  She did have a Cologuard in 2021 that was negative.   Previous history: Patient previously seen with [facility], now presents here for evaluation of acid reflux symptoms.  Her main complaint is cough that she associates with a burning sensation in the back of her throat.  She states she has been evaluated by ENT before and was told everything was normal.  She states this specific symptom was not present before when she has had her previous endoscopic evaluations.  Denies any nausea or vomiting.  Does report intermittent dysphagia specifically to specific foods such as chicken skin.  No episodes of food impaction.  Previous records personally reviewed and as per last GI note from 2020:  "2004: [facility] - no polyps per patient. Reportedly diagnosed with IBS. -- 08/03/14: MBSS (indic: dysphagia) - reportedly unremarkable. Labs - Celiac panel, H pylori ab negative. 10/16/14: EGD (indic: dysphagia, heartburn) - Non-obstructing Schatzki ring @ [facility] (dilated and biopsied - mild chronic inflammation). Scattered gastritis in antrum (path -  mild reactive gastropathy). Localized gastritis in body (path: focal acute reactive changes). Normal duodenum. 01/25/19: EGD - LA Grade A reflux esophagitis (Dilated. Path - active chronic gastritis of gastric-type mucosa). Two small fundic gland polyps in gastric body. Mild-moderate chronic gastritis in body and antrum. Normal duodenum."  Patient has never had a pH study.  Current Outpatient Medications  Medication Sig Dispense Refill  . ascorbic acid (VITAMIN C) 500 MG tablet Take by mouth.    Marland Kitchen b complex vitamins tablet Take 1 tablet by mouth 2 (two) times a day.    . Calcium-Phosphorus-Vitamin D (CITRACAL +D3 PO) Take 2 tablets by mouth 2 (two) times daily.    . cetirizine (ZYRTEC) 10 MG tablet Take 10 mg by mouth daily.    . Magnesium 250 MG TABS Take 1 tablet by mouth in the morning and at bedtime.    Marland Kitchen PANCREATIN PO Take 1,300 mg by mouth 3 (three) times daily.     . Quercetin 250 MG TABS Take 500 mg by mouth 2 (two) times daily.     No current facility-administered medications for this visit.    Allergies as of 02/06/2021 - Review Complete 02/06/2021  Allergen Reaction Noted  . Eggs or egg-derived products Itching and Rash 07/21/2019  . Amoxicillin Other (See Comments) 09/18/2014  . Cefdinir  09/18/2014  . Clarithromycin Other (See Comments) 09/18/2014  . Codeine Other (See Comments) 09/18/2014  . Hydromorphone hcl Other (See Comments) 09/18/2014  . Hydromorphone hcl Other (See Comments) 06/10/2020  . Meperidine Other (See Comments) 09/18/2014  .  Moxifloxacin Other (See Comments) 11/14/2015  . Nsaids  09/18/2014  . Omeprazole Other (See Comments) 06/19/2015  . Other Other (See Comments) 09/18/2014  . Prednisone Other (See Comments) 09/18/2014  . Pseudoephedrine hcl  09/18/2014  . Ranitidine  06/19/2015  . Salicylates Hives 63/87/5643  . Aspirin Hives and Rash 09/18/2014  . Dexilant [dexlansoprazole] Rash 09/12/2019  . Doxycycline Other (See Comments) 09/14/2017  .  Montelukast sodium Palpitations 09/18/2014    ROS:  General: Negative for anorexia, weight loss, fever, chills, fatigue, weakness. ENT: Negative for hoarseness, difficulty swallowing , nasal congestion. CV: Negative for chest pain, angina, palpitations, dyspnea on exertion, peripheral edema.  Respiratory: Negative for dyspnea at rest, dyspnea on exertion, cough, sputum, wheezing.  GI: See history of present illness. GU:  Negative for dysuria, hematuria, urinary incontinence, urinary frequency, nocturnal urination.  Endo: Negative for unusual weight change.    Physical Examination:   BP 115/76   Pulse 69   Temp 97.7 F (36.5 C) (Oral)   Wt 116 lb 3.2 oz (52.7 kg)   BMI 17.67 kg/m   General: Well-nourished, well-developed in no acute distress.  Eyes: No icterus. Conjunctivae pink. Mouth: Oropharyngeal mucosa moist and pink , no lesions erythema or exudate. Neck: Supple, Trachea midline Abdomen: Bowel sounds are normal, nontender, nondistended, no hepatosplenomegaly or masses, no abdominal bruits or hernia , no rebound or guarding.   Extremities: No lower extremity edema. No clubbing or deformities. Neuro: Alert and oriented x 3.  Grossly intact. Skin: Warm and dry, no jaundice.   Psych: Alert and cooperative, normal mood and affect.   Labs: CMP     Component Value Date/Time   NA 137 02/25/2019 1410   NA 138 12/05/2013 0800   K 4.2 02/25/2019 1410   K 3.6 12/05/2013 0800   CL 96 02/25/2019 1410   CL 105 12/05/2013 0800   CO2 28 02/25/2019 1410   CO2 28 12/05/2013 0800   GLUCOSE 87 02/25/2019 1410   GLUCOSE 113 (H) 09/14/2017 1007   GLUCOSE 111 (H) 12/05/2013 0800   BUN 18 02/25/2019 1410   BUN 9 12/05/2013 0800   CREATININE 1.00 05/02/2019 1519   CREATININE 0.82 12/05/2013 0800   CALCIUM 9.7 02/25/2019 1410   CALCIUM 8.5 12/05/2013 0800   PROT 6.9 02/25/2019 1410   PROT 6.6 12/05/2013 0800   ALBUMIN 4.5 02/25/2019 1410   ALBUMIN 3.4 12/05/2013 0800   AST 22  02/25/2019 1410   AST 29 12/05/2013 0800   ALT 15 02/25/2019 1410   ALT 27 12/05/2013 0800   ALKPHOS 92 02/25/2019 1410   ALKPHOS 66 12/05/2013 0800   BILITOT 0.5 02/25/2019 1410   BILITOT 1.4 (H) 12/05/2013 0800   GFRNONAA 63 02/25/2019 1410   GFRNONAA >60 12/05/2013 0800   GFRAA 72 02/25/2019 1410   GFRAA >60 12/05/2013 0800   Lab Results  Component Value Date   WBC 8.8 02/25/2019   HGB 13.8 02/25/2019   HCT 42.2 02/25/2019   MCV 90 02/25/2019   PLT 228 02/25/2019    Imaging Studies: No results found.  Assessment and Plan:   Celicia Minahan is a 70 y.o. y/o female here reporting weight loss and states that when she loses weight, she gets stressed and it causes her more anxiety which causes her to lose more weight  Patient has had extensive work-up with previous GIs and we discussed that her symptoms may be attributed to her anxiety and stressors and she acknowledges this herself.  Continue to work  with PCP on these issues as this can help her GI symptoms  She is not having any abdominal pain or any further dysphagia but is only concerned about weight loss.  But she eats a healthy diet for the most part but has been trying to incorporate more fats  We discussed a nutritionist referral to help her gain weight and she is agreeable to this  We also discussed endoscopic visualization with colonoscopy given her concerns for weight loss and no recent visualization.  However, patient would like to first start with nutritionist referral and consider colonoscopy thereafter if symptoms continue.  She states that she followed up with an endocrinologist and they have not found any reasons for her weight loss either  Dysphagia has resolved  Dr Audrey Hayden

## 2021-02-07 DIAGNOSIS — R7989 Other specified abnormal findings of blood chemistry: Secondary | ICD-10-CM | POA: Diagnosis not present

## 2021-02-08 DIAGNOSIS — E041 Nontoxic single thyroid nodule: Secondary | ICD-10-CM | POA: Diagnosis not present

## 2021-02-15 ENCOUNTER — Encounter: Payer: Self-pay | Admitting: Internal Medicine

## 2021-02-15 ENCOUNTER — Other Ambulatory Visit: Payer: Self-pay | Admitting: Family Medicine

## 2021-02-15 ENCOUNTER — Ambulatory Visit (INDEPENDENT_AMBULATORY_CARE_PROVIDER_SITE_OTHER): Payer: Medicare Other | Admitting: Internal Medicine

## 2021-02-15 ENCOUNTER — Other Ambulatory Visit: Payer: Self-pay

## 2021-02-15 VITALS — BP 104/74 | HR 67 | Temp 97.7°F | Ht 68.0 in | Wt 116.0 lb

## 2021-02-15 DIAGNOSIS — R252 Cramp and spasm: Secondary | ICD-10-CM | POA: Diagnosis not present

## 2021-02-15 DIAGNOSIS — M8000XD Age-related osteoporosis with current pathological fracture, unspecified site, subsequent encounter for fracture with routine healing: Secondary | ICD-10-CM | POA: Diagnosis not present

## 2021-02-15 DIAGNOSIS — R7989 Other specified abnormal findings of blood chemistry: Secondary | ICD-10-CM | POA: Diagnosis not present

## 2021-02-15 DIAGNOSIS — M810 Age-related osteoporosis without current pathological fracture: Secondary | ICD-10-CM | POA: Diagnosis not present

## 2021-02-15 DIAGNOSIS — Z681 Body mass index (BMI) 19 or less, adult: Secondary | ICD-10-CM

## 2021-02-15 NOTE — Progress Notes (Signed)
Date:  02/15/2021   Name:  Audrey Hayden   DOB:  1951/03/30   MRN:  [id number]   Chief Complaint: Spasms (In waste line, shoulder and face, Has went away pt say she thinks she wasn't getting enough magnesium, pt has been taking magnesium and it has went away, wants blood work done to see what her levels are ) and increased appetite  (Constantly hungry but is not gaining weight, has had thyroid check it was in normal limits, pt went to [place],  food passes thru her quickly since 2020, wants A1C they didn't not check for DM, wakes up in the middle of the night hungry, eats every 2-3 hours during the day)  HPI Muscle spasms - diffuse spasm in trunk, neck and face.  Started on Mag with resolution. She would like to have her levels checked.  Taking 250 mg bid.   Underweight - has had evaluation by multiple specialists. She admits to stress which causes her to eat less.  However, she also feels that she does not digest food or medication well.  She has seen GI who recommended Colonoscopy - pt decided to wait and requested nutrition consult instead.  She is very concerned about possible DM and low vitamin levels.  Lab Results  Component Value Date   CREATININE 1.00 05/02/2019   BUN 18 02/25/2019   NA 137 02/25/2019   K 4.2 02/25/2019   CL 96 02/25/2019   CO2 28 02/25/2019   No results found for: CHOL, HDL, LDLCALC, LDLDIRECT, TRIG, CHOLHDL Lab Results  Component Value Date   TSH 2.130 11/19/2017   No results found for: HGBA1C Lab Results  Component Value Date   WBC 8.8 02/25/2019   HGB 13.8 02/25/2019   HCT 42.2 02/25/2019   MCV 90 02/25/2019   PLT 228 02/25/2019   Lab Results  Component Value Date   ALT 15 02/25/2019   AST 22 02/25/2019   ALKPHOS 92 02/25/2019   BILITOT 0.5 02/25/2019     Review of Systems  Constitutional:  Positive for appetite change. Negative for chills, fatigue and fever.  HENT:  Positive for trouble swallowing (improved after EGd and  dilatation).   Respiratory:  Negative for chest tightness.   Cardiovascular:  Negative for chest pain and leg swelling.  Gastrointestinal:  Negative for abdominal pain, constipation and diarrhea.       Food intolerances  Hematological:  Negative for adenopathy.  Psychiatric/Behavioral:  Positive for dysphoric mood. Negative for sleep disturbance. The patient is not nervous/anxious.    Patient Active Problem List   Diagnosis Date Noted   Esophageal dysphagia    Schatzki's ring    Gastric erythema    Bronchiectasis ([place]) 08/10/2020   Asymptomatic spider veins of both lower extremities 02/25/2019   Chronic left-sided thoracic back pain 12/09/2017   Anxiety 12/09/2017   Environmental and seasonal allergies 06/19/2015   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/19/2015   Osteoporosis 06/19/2015   Abnormal Pap smear of cervix 06/19/2015   Elevated HDL 06/19/2015   History of gastritis 11/08/2014   Cough, persistent 08/14/2014    Allergies  Allergen Reactions   Eggs Or Egg-Derived Products Itching and Rash   Amoxicillin Other (See Comments)    Tingling around the mouth   Cefdinir     Other reaction(s): Other (See Comments) Bright red face and ankle swelling: possible reaction to the medicine.    Clarithromycin Other (See Comments)    Causes her to faint.  Codeine Other (See Comments)    Causes her GI upset.   Hydromorphone Hcl Other (See Comments)    Unsure of reaction   Hydromorphone Hcl Other (See Comments)   Meperidine Other (See Comments)    Unsure of reaction   Moxifloxacin Other (See Comments)    Dizziness    Nsaids     Other reaction(s): Unknown   Omeprazole Other (See Comments)    Ears ringing with more than 20mg /day   Other Other (See Comments)    All profins cause her to faint. All the mycins cause her to faint.   Prednisone Other (See Comments)    Bright red face and ankle swelling: possible reaction to the medicine.   Pseudoephedrine Hcl     Other  reaction(s): Syncope   Ranitidine     Other reaction(s): Other (See Comments) SE noted with high dosage Dk urine and stools, HA   Salicylates Hives   Aspirin Hives and Rash   Dexilant [Dexlansoprazole] Rash    Peeling skin   Doxycycline Other (See Comments)    Headache. "Pressure on the brain''   Montelukast Sodium Palpitations    Past Surgical History:  Procedure Laterality Date   BREAST BIOPSY Left    neg   COLONOSCOPY  2004   ESOPHAGOGASTRODUODENOSCOPY (EGD) WITH PROPOFOL N/A 12/05/2020   Procedure: ESOPHAGOGASTRODUODENOSCOPY (EGD) WITH PROPOFOL;  Surgeon: Virgel Manifold, MD;  Location: [facility] ENDOSCOPY;  Service: Endoscopy;  Laterality: N/A;   TONSILLECTOMY     UPPER GI ENDOSCOPY  12/2018    Social History   Tobacco Use   Smoking status: Former    Packs/day: 1.50    Years: 5.00    Pack years: 7.50    Types: Cigarettes    Start date: 14    Quit date: 04/02/1975    Years since quitting: 45.9   Smokeless tobacco: Never   Tobacco comments:    smoking cessation materials not required  Vaping Use   Vaping Use: Never used  Substance Use Topics   Alcohol use: No    Alcohol/week: 0.0 standard drinks   Drug use: No     Medication list has been reviewed and updated.  Current Meds  Medication Sig   b complex vitamins tablet Take 1 tablet by mouth 2 (two) times a day.   Calcium-Phosphorus-Vitamin D (CITRACAL +D3 PO) Take 2 tablets by mouth 2 (two) times daily.   cetirizine (ZYRTEC) 10 MG tablet Take 10 mg by mouth daily.   Magnesium 250 MG TABS Take 1 tablet by mouth in the morning and at bedtime.   Multiple Vitamin (MULTI-VITAMIN) tablet Take 1 tablet by mouth daily.   PANCREATIN PO Take 1,300 mg by mouth 3 (three) times daily.    Quercetin 250 MG TABS Take 500 mg by mouth 2 (two) times daily.   [DISCONTINUED] ascorbic acid (VITAMIN C) 500 MG tablet Take by mouth.    PHQ 2/9 Scores 10/26/2020 08/10/2020 07/09/2020 09/12/2019  PHQ - 2 Score 0 4 0 0  PHQ- 9 Score  4 10 - -    GAD 7 : Generalized Anxiety Score 10/26/2020 08/10/2020 11/04/2017  Nervous, Anxious, on Edge 1 2 3   Control/stop worrying 0 2 2  Worry too much - different things 0 2 2  Trouble relaxing 0 2 3  Restless 0 2 3  Easily annoyed or irritable 0 2 1  Afraid - awful might happen 0 0 3  Total GAD 7 Score 1 12 17   Anxiety Difficulty - Somewhat  difficult Extremely difficult    BP Readings from Last 3 Encounters:  02/15/21 104/74  02/06/21 115/76  01/09/21 121/67    Physical Exam Vitals and nursing note reviewed.  Constitutional:      General: She is not in acute distress.    Appearance: She is well-developed.  HENT:     Head: Normocephalic and atraumatic.  Pulmonary:     Effort: Pulmonary effort is normal. No respiratory distress.  Skin:    General: Skin is warm and dry.     Findings: No rash.  Neurological:     Mental Status: She is alert and oriented to person, place, and time.  Psychiatric:        Mood and Affect: Mood normal.        Behavior: Behavior normal.    Wt Readings from Last 3 Encounters:  02/15/21 116 lb (52.6 kg)  02/06/21 116 lb 3.2 oz (52.7 kg)  01/09/21 121 lb (54.9 kg)    BP 104/74   Pulse 67   Temp 97.7 F (36.5 C) (Oral)   Ht 5\' 8"  (1.727 m)   Wt 116 lb (52.6 kg)   SpO2 97%   BMI 17.64 kg/m   Assessment and Plan: 1. Muscle cramps at night Continue magnesium 250 mg bid Magnesium level ordered  2. Adult BMI <19 kg/sq m Significantly underweight but recent albumin was normal She will continue frequent small meals; attempt to reduce stressors I encourage her to pursue colonoscopy - she will call GI Agree with nutrition consult Ordered A1C, B12  3. Age-related osteoporosis with current pathological fracture with routine healing, subsequent encounter Check vitamin D, 25-dihydroxy  Note: pt requested labs and will pay out of pocket.  Partially dictated using Editor, commissioning. Any errors are unintentional.  Halina Maidens,  MD [facility]  02/15/2021

## 2021-02-18 ENCOUNTER — Telehealth: Payer: Self-pay | Admitting: Gastroenterology

## 2021-02-18 LAB — MAGNESIUM: Magnesium: 2 mg/dL (ref 1.6–2.3)

## 2021-02-18 LAB — HGB A1C W/O EAG: Hgb A1c MFr Bld: 5.7 % — ABNORMAL HIGH (ref 4.8–5.6)

## 2021-02-18 LAB — VITAMIN B12: Vitamin B-12: 1450 pg/mL — ABNORMAL HIGH (ref 232–1245)

## 2021-02-18 LAB — VITAMIN D 25 HYDROXY (VIT D DEFICIENCY, FRACTURES): Vit D, 25-Hydroxy: 117 ng/mL — ABNORMAL HIGH (ref 30.0–100.0)

## 2021-02-18 NOTE — Telephone Encounter (Signed)
Patient calling about the nutrionist referral.

## 2021-02-19 NOTE — Telephone Encounter (Signed)
Called pt, no answer VM full...   Pt can call the nutrition office if she would like to touch base [phone]... It says to have her scheduled by July

## 2021-02-26 NOTE — Telephone Encounter (Signed)
Pt is aware and # given for her to call to schedule

## 2021-03-01 ENCOUNTER — Encounter: Payer: Medicare Other | Attending: Gastroenterology | Admitting: Dietician

## 2021-03-01 ENCOUNTER — Encounter: Payer: Self-pay | Admitting: Dietician

## 2021-03-01 ENCOUNTER — Other Ambulatory Visit: Payer: Self-pay

## 2021-03-01 VITALS — Ht 68.0 in | Wt 115.6 lb

## 2021-03-01 DIAGNOSIS — R634 Abnormal weight loss: Secondary | ICD-10-CM | POA: Diagnosis not present

## 2021-03-01 DIAGNOSIS — Z681 Body mass index (BMI) 19 or less, adult: Secondary | ICD-10-CM | POA: Insufficient documentation

## 2021-03-01 DIAGNOSIS — Z713 Dietary counseling and surveillance: Secondary | ICD-10-CM | POA: Diagnosis not present

## 2021-03-01 NOTE — Progress Notes (Signed)
Medical Nutrition Therapy: Visit start time: 6237  end time: 6283  Assessment:  Diagnosis: weight loss Past medical history: GERD, hypoglycemia, IBS, osteoporosis, bronchiectasis Psychosocial issues/ stress concerns: anxiety, high stress level -- has counseling appointment next week  Preferred learning method:  Auditory Visual Hands-on  Current weight: 115.6lbs Height: 5'8" BMI: 17.58 Medications, supplements: reconciled list in medical record  Progress and evaluation:  Patient reports she often has symptoms of hunger, followed by "bottoming out" feeling, like risk of fainting. She has been eating about avery 2 hours to prevent symptoms. Recent labwork indicates HbA1C of 5.7% Was tested for gastroparesis in 2020; states she was found to have increased motility rather than decreased. Works to include protein regularly throughout the day to maintain strength and gain weight Feels symptoms of mental fogginess with some foods ie bananas -- wonders if she has allergies. Meds such as ppis and sucralfate have not improved GERD symptoms, but Pancreatin supplement has helped. She reports taking this for many years.  Had experienced unplanned weight loss in the past due to stress; feels increased sensitivity to meds when at a low weight. Limits food additives and processed foods. Increased sensitivity to additives when at low weight.    Physical activity: walking 30 minutes daily  Dietary Intake:  Usual eating pattern includes 3 meals and 2 snacks per day. Dining out frequency: 1 meals per week.  Breakfast: 6/30 -- baked sea bass, cream of buckwheat with fresh plum (acid caused stomach swelling) Snack: 6/30 milk and banana (caused GI distress) Lunch: 6/30 -- baked mahi-mahi, bib lettuce with avocado olive oil and maple syrup as dressing,  brown rice, bread with honey -- GI distress after Snack: 6/30 -- tuna salad and pears no GI sx Supper: 6/30 -- baked salmon and mashed potatoes Snack:  none Beverages: distilled water; avoids juices  Nutrition Care Education: Topics covered:  Basic nutrition: basic food groups, appropriate nutrient balance, appropriate meal and snack schedule, general nutrition guidelines    Weight gain: adding healthy fats and calorie dense foods; appropriate food portions; estimated energy needs for weight gain at 1800kcal, provided guidance for 45% CHO, 25% pro, 30% fat; discussed options for protein shakes Hypoglycemia: controlling carb intake, choosing whole grains/ unrefined carbs; inclusion of proteins and healthy fats; discussed home BG testing per patient question GI distress: avoidance of large meals, high fat intake; avoiding acidic, spicy foods; role of stress  Nutritional Diagnosis:  [place]-3.2 Unintentional weight loss As related to stress, GI symptoms.  As evidenced by patient with current BMI of 15.6.  Intervention:  Instruction and discussion as noted above. Patient has been researching for help in alleviating GERD symptoms as well as hypoglycemia symptoms while trying to regain weight. She will plan to follow structured, balanced meal pattern to alleviate adverse symptoms and ensure adequate caloric intake for weight gain.   Education Materials given:  Museum/gallery conservator with food lists, sample meal pattern Gaining Weight in a Healthful Way Visit summary with goals/ instructions   Learner/ who was taught:  Patient   Level of understanding: Verbalizes/ demonstrates competency   Demonstrated degree of understanding via:   Teach back Learning barriers: None  Willingness to learn/ readiness for change: Eager, change in progress   Monitoring and Evaluation:  Dietary intake, exercise, GI symptoms, and body weight      follow up:  04/17/21 at 10:30am

## 2021-03-01 NOTE — Patient Instructions (Signed)
Make sure to include a small amount of healthy fat with meals along with protein and moderate amount of whole grain/ starch to control hunger and stabilize blood sugar.  Continue eating often during the day every 2-4 hours.  Healthy fats including nuts, nut butters, and seeds provide a lot of calories in a small amount of food and can stabilize blood sugars.  Fiber in foods can help stabilize blood sugars as well.

## 2021-03-06 ENCOUNTER — Other Ambulatory Visit: Payer: Self-pay

## 2021-03-06 ENCOUNTER — Ambulatory Visit (INDEPENDENT_AMBULATORY_CARE_PROVIDER_SITE_OTHER): Payer: Medicare Other | Admitting: Gastroenterology

## 2021-03-06 ENCOUNTER — Telehealth: Payer: Self-pay

## 2021-03-06 ENCOUNTER — Ambulatory Visit: Payer: Self-pay

## 2021-03-06 ENCOUNTER — Telehealth: Payer: Self-pay | Admitting: *Deleted

## 2021-03-06 VITALS — Temp 98.1°F | Wt 115.6 lb

## 2021-03-06 DIAGNOSIS — K909 Intestinal malabsorption, unspecified: Secondary | ICD-10-CM | POA: Diagnosis not present

## 2021-03-06 DIAGNOSIS — K219 Gastro-esophageal reflux disease without esophagitis: Secondary | ICD-10-CM | POA: Diagnosis not present

## 2021-03-06 DIAGNOSIS — K449 Diaphragmatic hernia without obstruction or gangrene: Secondary | ICD-10-CM | POA: Diagnosis not present

## 2021-03-06 DIAGNOSIS — R140 Abdominal distension (gaseous): Secondary | ICD-10-CM | POA: Diagnosis not present

## 2021-03-06 MED ORDER — OMEPRAZOLE 20 MG PO CPDR: 20.0000 mg | DELAYED_RELEASE_CAPSULE | Freq: Every day | ORAL | 0 refills | Status: DC

## 2021-03-06 NOTE — Telephone Encounter (Signed)
Noted  KP 

## 2021-03-06 NOTE — Telephone Encounter (Signed)
Pt. Reports she has been feeling ill for "quite a while." Has lost weight and having left-sided abdominal pain. Has yellow stools. Has been seeing a nutritionist as well. Has an appointment for tomorrow.

## 2021-03-06 NOTE — Telephone Encounter (Signed)
I spoke to pt and she is agreeable to come in today at 2pm, understands she is being fit in and may be a short wait.Marland KitchenMarland Kitchen

## 2021-03-06 NOTE — Progress Notes (Signed)
Audrey Antigua, MD [address]  [place]  [place], [place] [address]  Main: [phone]  Fax: [phone]   Primary Care Physician: Glean Hess, MD   Chief complaint: Fatty stools, abdominal bloating  HPI: Audrey Hayden is a 70 y.o. female here for follow-up.  With history of previous extensive work-up by other GIs in the past as well, reporting fatty stools for the last 1 to 2 weeks, and associated abdominal bloating.  Reports 1-2 formed bowel movements a day otherwise.  Was referred to nutritionist as patient was having trouble gaining weight and she has a lot of anxiety around being unable to gain weight.  She states she is trying to follow the nutritionist recommendations in order to gain weight.  Most recently EGD done in April 2022 for dysphagia showed Schatzki's ring that was dilated to 20 mm.  Patient reported resolution of dysphagia since then.  A distal esophageal nodule was seen and biopsied.  Gastric erythema was seen.  Gastric erosion was seen.  Gastric polyps were noted.  Mucosal changes in the duodenum were biopsied.  Duodenal biopsies did not show villous atrophy.  Gastric biopsies showed reactive gastropathy and just 1 fragment.  No H. pylori was noted.  Stomach polyps confirmed fundic gland polyps.  GE junction biopsy showed polypoid fragment of GE junction mucosa.  Esophageal biopsies did not show increased intraepithelial eosinophils.  Previous history: On last visit patient stated that she had a colonoscopy in 2016, but now states that she did not have a colonoscopy in 2016 and the last one was in 2004.  She did have a Cologuard in 2021 that was negative.  Patient previously seen with [facility] clinic GI, now presents here for evaluation of acid reflux symptoms.  Her main complaint is cough that she associates with a burning sensation in the back of her throat.  She states she has been evaluated by ENT before and was told everything was normal.   She states this specific symptom was not present before when she has had her previous endoscopic evaluations.  Denies any nausea or vomiting.  Does report intermittent dysphagia specifically to specific foods such as chicken skin.  No episodes of food impaction.  Previous records personally reviewed and as per last GI note from 2020:  "2004: CSY - no polyps per patient. Reportedly diagnosed with IBS. -- 08/03/14: MBSS (indic: dysphagia) - reportedly unremarkable. Labs - Celiac panel, H pylori ab negative. 10/16/14: EGD (indic: dysphagia, heartburn) - Non-obstructing Schatzki ring @ [facility] (dilated and biopsied - mild chronic inflammation). Scattered gastritis in antrum (path - mild reactive gastropathy). Localized gastritis in body (path: focal acute reactive changes). Normal duodenum. 01/25/19: EGD - LA Grade A reflux esophagitis (Dilated. Path - active chronic gastritis of gastric-type mucosa). Two small fundic gland polyps in gastric body. Mild-moderate chronic gastritis in body and antrum. Normal duodenum."  Patient has never had a pH study.   ROS: All ROS reviewed and negative except as per HPI   Past Medical History:  Diagnosis Date   Allergy    Bronchiectasis ([place])    GERD (gastroesophageal reflux disease)    Gross hematuria 02/25/2019   IBS (irritable bowel syndrome)    Osteoporosis     Past Surgical History:  Procedure Laterality Date   BREAST BIOPSY Left    neg   COLONOSCOPY  2004   ESOPHAGOGASTRODUODENOSCOPY (EGD) WITH PROPOFOL N/A 12/05/2020   Procedure: ESOPHAGOGASTRODUODENOSCOPY (EGD) WITH PROPOFOL;  Surgeon: Virgel Manifold, MD;  Location: [facility] ENDOSCOPY;  Service: Endoscopy;  Laterality: N/A;   TONSILLECTOMY     UPPER GI ENDOSCOPY  12/2018    Prior to Admission medications   Medication Sig Start Date End Date Taking? Authorizing Provider  b complex vitamins tablet Take 1 tablet by mouth 2 (two) times a day.   Yes [provider]  Calcium-Phosphorus-Vitamin  D (CITRACAL +D3 PO) Take 2 tablets by mouth 2 (two) times daily.   Yes [provider]  cetirizine (ZYRTEC) 10 MG tablet Take 10 mg by mouth daily.   Yes [provider]  Magnesium 250 MG TABS Take 1 tablet by mouth in the morning and at bedtime.   Yes [provider]  Multiple Vitamin (MULTI-VITAMIN) tablet Take 1 tablet by mouth daily.   Yes [provider]  omeprazole (PRILOSEC) 20 MG capsule Take 1 capsule (20 mg total) by mouth daily. 03/06/21  Yes Audrey Hayden B, MD  PANCREATIN PO Take 1,300 mg by mouth 3 (three) times daily.    Yes [provider]  Quercetin 250 MG TABS Take 500 mg by mouth 2 (two) times daily.   Yes [provider]    Family History  Problem Relation Age of Onset   Hypertension Mother    CAD Father    Heart attack Father    Breast cancer Neg Hx    Colon cancer Neg Hx      Social History   Tobacco Use   Smoking status: Former    Packs/day: 1.50    Years: 5.00    Pack years: 7.50    Types: Cigarettes    Start date: 22    Quit date: 04/02/1975    Years since quitting: 45.9   Smokeless tobacco: Never   Tobacco comments:    smoking cessation materials not required  Vaping Use   Vaping Use: Never used  Substance Use Topics   Alcohol use: No    Alcohol/week: 0.0 standard drinks   Drug use: No    Allergies as of 03/06/2021 - Review Complete 03/06/2021  Allergen Reaction Noted   Eggs or egg-derived products Itching and Rash 07/21/2019   Amoxicillin Other (See Comments) 09/18/2014   Cefdinir  09/18/2014   Clarithromycin Other (See Comments) 09/18/2014   Codeine Other (See Comments) 09/18/2014   Hydromorphone hcl Other (See Comments) 09/18/2014   Hydromorphone hcl Other (See Comments) 06/10/2020   Meperidine Other (See Comments) 09/18/2014   Moxifloxacin Other (See Comments) 11/14/2015   Nsaids  09/18/2014   Omeprazole Other (See Comments) 06/19/2015   Other Other (See Comments) 09/18/2014    Prednisone Other (See Comments) 09/18/2014   Pseudoephedrine hcl  09/18/2014   Ranitidine  70/62/3762   Salicylates Hives 83/15/1761   Aspirin Hives and Rash 09/18/2014   Dexilant [dexlansoprazole] Rash 09/12/2019   Doxycycline Other (See Comments) 09/14/2017   Montelukast sodium Palpitations 09/18/2014    Physical Examination:  Constitutional: General:   Alert,  Well-developed, well-nourished, pleasant and cooperative in NAD Temp 98.1 F (36.7 C) (Oral)   Wt 115 lb 9.6 oz (52.4 kg)   BMI 17.58 kg/m   Respiratory: Normal respiratory effort  Gastrointestinal:  Soft, non-tender and non-distended without masses, hepatosplenomegaly or hernias noted.  No guarding or rebound tenderness.     Cardiac: No clubbing or edema.  No cyanosis. Normal posterior tibial pedal pulses noted.  Psych:  Alert and cooperative. Normal mood and affect.  Musculoskeletal:  Normal gait. Head normocephalic, atraumatic. Symmetrical without gross deformities. 5/5 Lower extremity  strength bilaterally.  Skin: Warm. Intact without significant lesions or rashes. No jaundice.  Neck: Supple, trachea midline  Lymph: No cervical lymphadenopathy  Psych:  Alert and oriented x3, Alert and cooperative. Normal mood and affect.  Labs: CMP     Component Value Date/Time   NA 137 02/25/2019 1410   NA 138 12/05/2013 0800   K 4.2 02/25/2019 1410   K 3.6 12/05/2013 0800   CL 96 02/25/2019 1410   CL 105 12/05/2013 0800   CO2 28 02/25/2019 1410   CO2 28 12/05/2013 0800   GLUCOSE 87 02/25/2019 1410   GLUCOSE 113 (H) 09/14/2017 1007   GLUCOSE 111 (H) 12/05/2013 0800   BUN 18 02/25/2019 1410   BUN 9 12/05/2013 0800   CREATININE 1.00 05/02/2019 1519   CREATININE 0.82 12/05/2013 0800   CALCIUM 9.7 02/25/2019 1410   CALCIUM 8.5 12/05/2013 0800   PROT 6.9 02/25/2019 1410   PROT 6.6 12/05/2013 0800   ALBUMIN 4.5 02/25/2019 1410   ALBUMIN 3.4 12/05/2013 0800   AST 22 02/25/2019 1410   AST 29 12/05/2013 0800   ALT  15 02/25/2019 1410   ALT 27 12/05/2013 0800   ALKPHOS 92 02/25/2019 1410   ALKPHOS 66 12/05/2013 0800   BILITOT 0.5 02/25/2019 1410   BILITOT 1.4 (H) 12/05/2013 0800   GFRNONAA 63 02/25/2019 1410   GFRNONAA >60 12/05/2013 0800   GFRAA 72 02/25/2019 1410   GFRAA >60 12/05/2013 0800   Lab Results  Component Value Date   WBC 8.8 02/25/2019   HGB 13.8 02/25/2019   HCT 42.2 02/25/2019   MCV 90 02/25/2019   PLT 228 02/25/2019    Imaging Studies:   Assessment and Plan:   Audrey Hayden is a 70 y.o. y/o female with previous extensive GI work-up, with her main complaint being inability to gain weight and has started seeing a nutritionist since then, reporting fatty stools for the last 1 to 2 weeks with some abdominal bloating  Patient states when she saw the nutritionist they suggested pancreatic insufficiency testing.  Nutritionist note reviewed and they have given her a diet plan to help with weight gain.  We extensively did discuss with the patient that her underlying symptoms may be functional in her anxiety over not being able to gain weight may be causing her more stressors.  We discussed the reassuring work-up that she has had so far in our clinic and other GI clinics in the past as well.  Patient advised to work on anxiety and stressors with PCP as well  TSH level , free t4 reviewed and was normal recently, and therefore not causing her symptoms or inability to gain weight  However, due to her complaint of possible steatorrhea, will obtain fecal pancreatic elastase for further evaluation  She has a small hiatal hernia and she is also reporting some reflux symptoms and we will start her on PPI once daily.  She is also interested in surgical referral for evaluation of this as well  (Risks of PPI use were discussed with patient including bone loss, C. Diff diarrhea, pneumonia, infections, CKD, electrolyte abnormalities.  Pt. Verbalizes understanding and chooses to continue the  medication.)   Dr Audrey Hayden

## 2021-03-06 NOTE — Telephone Encounter (Signed)
Referral form w/ demographics, insurance, OV notes and EGD faxed to [facility] evaluation for hiatal hernia

## 2021-03-06 NOTE — Telephone Encounter (Signed)
Answer Assessment - Initial Assessment Questions 1. DESCRIPTION: "Describe your dizziness."     Dizzy 2. LIGHTHEADED: "Do you feel lightheaded?" (e.g., somewhat faint, woozy, weak upon standing)     Dizzy 3. VERTIGO: "Do you feel like either you or the room is spinning or tilting?" (i.e. vertigo)     No 4. SEVERITY: "How bad is it?"  "Do you feel like you are going to faint?" "Can you stand and walk?"   - MILD: Feels slightly dizzy, but walking normally.   - MODERATE: Feels unsteady when walking, but not falling; interferes with normal activities (e.g., school, work).   - SEVERE: Unable to walk without falling, or requires assistance to walk without falling; feels like passing out now.      Mild 5. ONSET:  "When did the dizziness begin?"     Several weeks 6. AGGRAVATING FACTORS: "Does anything make it worse?" (e.g., standing, change in head position)     Walking 7. HEART RATE: "Can you tell me your heart rate?" "How many beats in 15 seconds?"  (Note: not all patients can do this)       No 8. CAUSE: "What do you think is causing the dizziness?"     Unsure 9. RECURRENT SYMPTOM: "Have you had dizziness before?" If Yes, ask: "When was the last time?" "What happened that time?"     No 10. OTHER SYMPTOMS: "Do you have any other symptoms?" (e.g., fever, chest pain, vomiting, diarrhea, bleeding)       Abdominal pain and weight loss 11. PREGNANCY: "Is there any chance you are pregnant?" "When was your last menstrual period?"       No  Protocols used: Dizziness - Lightheadedness-A-AH

## 2021-03-06 NOTE — Telephone Encounter (Signed)
Patient called and LVM that she has been seeing the nutritionist for weight loss. She is doing what they have told her but she is still loosing weight. Patient reports she is having swelling on the left side of her abdomen, fatty yellow/pale brown stools, weakness. Patient verbalizes nutritionist thinks she is may have pancreatic insufficiency and needs to be evaluated. Patient is wanting an appointment/ Please advise where patient can be scheduled

## 2021-03-07 ENCOUNTER — Ambulatory Visit (INDEPENDENT_AMBULATORY_CARE_PROVIDER_SITE_OTHER): Payer: Medicare Other | Admitting: Internal Medicine

## 2021-03-07 ENCOUNTER — Encounter: Payer: Self-pay | Admitting: Internal Medicine

## 2021-03-07 VITALS — BP 120/80 | HR 70 | Temp 98.3°F | Ht 68.0 in | Wt 114.0 lb

## 2021-03-07 DIAGNOSIS — R140 Abdominal distension (gaseous): Secondary | ICD-10-CM

## 2021-03-07 DIAGNOSIS — F419 Anxiety disorder, unspecified: Secondary | ICD-10-CM

## 2021-03-07 DIAGNOSIS — Z8719 Personal history of other diseases of the digestive system: Secondary | ICD-10-CM | POA: Diagnosis not present

## 2021-03-07 MED ORDER — RANITIDINE HCL 75 MG PO TABS: 75.0000 mg | ORAL_TABLET | Freq: Two times a day (BID) | ORAL | 0 refills | Status: DC

## 2021-03-07 NOTE — Progress Notes (Signed)
Date:  03/07/2021   Name:  Audrey Hayden   DOB:  [birth year]   MRN:  [id number]   Chief Complaint: Anxiety (About health) and GI Problem (Would like labs today for further work-up from GI; per patient is having fatty, bright yellow stools, increased appetite, abdominal bloating and swelling)  Abdominal Pain This is a recurrent problem. The pain is located in the generalized abdominal region. The quality of the pain is cramping and a sensation of fullness. Pertinent negatives include no constipation or diarrhea.  She was recently seen by GI.  GI recommended omeprazole at a low dose and a test for pancreatic insufficiency.  They also suggested that many of her symptoms were related to anxiety. She admits to a lot of anxiety but feels like she is controlling that with lysine supplements.  She is very concerned about the bloating that occurs after eating and spends an hour or more with each meal because she eats so slowly.  She claims to be hungry yet has not gained any weight.  She is very hesitant to take omeprazole as she did not tolerate it in the past.  She is also not eager to begin any sort of anxiety relieving medications. Lab Results  Component Value Date   CREATININE 1.00 05/02/2019   BUN 18 02/25/2019   NA 137 02/25/2019   K 4.2 02/25/2019   CL 96 02/25/2019   CO2 28 02/25/2019   No results found for: CHOL, HDL, LDLCALC, LDLDIRECT, TRIG, CHOLHDL Lab Results  Component Value Date   TSH 2.130 11/19/2017   Lab Results  Component Value Date   HGBA1C 5.7 (H) 02/15/2021   Lab Results  Component Value Date   WBC 8.8 02/25/2019   HGB 13.8 02/25/2019   HCT 42.2 02/25/2019   MCV 90 02/25/2019   PLT 228 02/25/2019   Lab Results  Component Value Date   ALT 15 02/25/2019   AST 22 02/25/2019   ALKPHOS 92 02/25/2019   BILITOT 0.5 02/25/2019     Review of Systems  Constitutional:  Negative for appetite change and unexpected weight change.  Respiratory:  Negative for  chest tightness and shortness of breath.   Gastrointestinal:  Positive for abdominal distention and abdominal pain. Negative for blood in stool, constipation and diarrhea.  Psychiatric/Behavioral:  Negative for decreased concentration and sleep disturbance. The patient is nervous/anxious.    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    Clarithromycin Other (See Comments)    Causes her to faint.   Codeine Other (See Comments)    Causes her GI upset.   Hydromorphone Hcl Other (See Comments)    Unsure of reaction  Hydromorphone Hcl Other (See Comments)   Meperidine Other (See Comments)    Unsure of reaction   Moxifloxacin Other (See Comments)    Dizziness    Nsaids     Other reaction(s): Unknown   Omeprazole Other (See Comments)    Ears ringing with more than 20mg /day   Other Other (See Comments)    All profins cause her to faint. All the mycins cause her to faint.   Prednisone Other (See Comments)    Bright red face and ankle swelling: possible reaction to the medicine.   Pseudoephedrine Hcl     Other reaction(s): Syncope   Ranitidine     Other reaction(s): Other (See Comments) SE noted with high  dosage Dk urine and stools, HA   Salicylates Hives   Aspirin Hives and Rash   Dexilant [Dexlansoprazole] Rash    Peeling skin   Doxycycline Other (See Comments)    Headache. "Pressure on the brain''   Montelukast Sodium Palpitations    Past Surgical History:  Procedure Laterality Date   BREAST BIOPSY Left    neg   COLONOSCOPY  2004   ESOPHAGOGASTRODUODENOSCOPY (EGD) WITH PROPOFOL N/A 12/05/2020   Procedure: ESOPHAGOGASTRODUODENOSCOPY (EGD) WITH PROPOFOL;  Surgeon: Virgel Manifold, MD;  Location: [facility] ENDOSCOPY;  Service: Endoscopy;  Laterality: N/A;   TONSILLECTOMY     UPPER GI ENDOSCOPY  12/2018    Social History   Tobacco Use   Smoking status: Former    Packs/day: 1.50    Years: 5.00    Pack years: 7.50    Types: Cigarettes    Start date: 70    Quit date: 04/02/1975    Years since quitting: 45.9   Smokeless tobacco: Never   Tobacco comments:    smoking cessation materials not required  Vaping Use   Vaping Use: Never used  Substance Use Topics   Alcohol use: No    Alcohol/week: 0.0 standard drinks   Drug use: No     Medication list has been reviewed and updated.  Current Meds  Medication Sig   b complex vitamins tablet Take 1 tablet by mouth 2 (two) times a day.   Calcium-Phosphorus-Vitamin D (CITRACAL +D3 PO) Take 2 tablets by mouth 2 (two) times daily.   cetirizine (ZYRTEC) 10 MG tablet Take 10 mg by mouth daily.   Magnesium 250 MG TABS Take 1 tablet by mouth in the morning and at bedtime.   Multiple Vitamin (MULTI-VITAMIN) tablet Take 1 tablet by mouth daily.   PANCREATIN PO Take 1,300 mg by mouth 3 (three) times daily.    Quercetin 250 MG TABS Take 500 mg by mouth 2 (two) times daily.    PHQ 2/9 Scores 03/01/2021 02/15/2021 10/26/2020 08/10/2020  PHQ - 2 Score 1 0 0 4  PHQ- 9 Score - 3 4 10     GAD 7 : Generalized Anxiety Score 02/15/2021 10/26/2020 08/10/2020 11/04/2017  Nervous, Anxious, on Edge 1 1 2 3   Control/stop worrying 1 0 2 2  Worry too much  - different things 1 0 2 2  Trouble relaxing 1 0 2 3  Restless 1 0 2 3  Easily annoyed or irritable 1 0 2 1  Afraid - awful might happen 1 0 0 3  Total GAD 7 Score 7 1 12 17   Anxiety Difficulty - - Somewhat difficult Extremely difficult    BP Readings from Last 3 Encounters:  03/07/21 120/80  02/15/21 104/74  02/06/21 115/76    Physical Exam Vitals and nursing  note reviewed.  Constitutional:      General: She is not in acute distress.    Appearance: She is well-developed.  HENT:     Head: Normocephalic and atraumatic.  Pulmonary:     Effort: Pulmonary effort is normal. No respiratory distress.  Skin:    General: Skin is warm and dry.     Findings: No rash.  Neurological:     Mental Status: She is alert and oriented to person, place, and time.  Psychiatric:        Mood and Affect: Mood is anxious.        Behavior: Behavior normal.        Cognition and Memory: Cognition normal.        Judgment: Judgment normal.    Wt Readings from Last 3 Encounters:  03/07/21 114 lb (51.7 kg)  03/06/21 115 lb 9.6 oz (52.4 kg)  03/01/21 115 lb 9.6 oz (52.4 kg)    BP 120/80 (BP Location: Right Arm, Patient Position: Sitting, Cuff Size: Normal)   Pulse 70   Temp 98.3 F (36.8 C) (Oral)   Ht 5\' 8"  (1.727 m)   Wt 114 lb (51.7 kg)   SpO2 98%   BMI 17.33 kg/m   Assessment and Plan: 1. Abdominal bloating Patient continues to complain of abdominal bloating especially after eating.  She spends an inordinate amount of time consuming a single meal.  Reviewing her chart she has had a normal gastric emptying study.  She has a pending order for pancreatic elastase.  She does plan to submit the sample as soon as possible.  2. History of gastritis She is hesitant to begin omeprazole as she had intolerance in the past.  After further discussion she agreed to try a low-dose Zantac. - ranitidine (ZANTAC) 75 MG tablet; Take 1 tablet (75 mg total) by mouth 2 (two) times daily.  Dispense: 60 tablet;  Refill: 0  3. Anxiety We discussed anxiety and its component contributing to her symptoms.  At this time she prefers to continue lysine supplements.  However we will consider a trial of low-dose SSRI in the future if needed.   Partially dictated using Editor, commissioning. Any errors are unintentional.  Halina Maidens, MD [facility]  03/07/2021

## 2021-03-10 ENCOUNTER — Other Ambulatory Visit: Payer: Self-pay

## 2021-03-10 DIAGNOSIS — R188 Other ascites: Secondary | ICD-10-CM | POA: Diagnosis not present

## 2021-03-10 DIAGNOSIS — Z87891 Personal history of nicotine dependence: Secondary | ICD-10-CM | POA: Diagnosis not present

## 2021-03-10 DIAGNOSIS — R109 Unspecified abdominal pain: Secondary | ICD-10-CM | POA: Diagnosis not present

## 2021-03-10 DIAGNOSIS — K219 Gastro-esophageal reflux disease without esophagitis: Secondary | ICD-10-CM | POA: Insufficient documentation

## 2021-03-10 LAB — URINALYSIS, COMPLETE (UACMP) WITH MICROSCOPIC
Bacteria, UA: NONE SEEN
Bilirubin Urine: NEGATIVE
Glucose, UA: NEGATIVE mg/dL
Ketones, ur: NEGATIVE mg/dL
Leukocytes,Ua: NEGATIVE
Nitrite: NEGATIVE
Protein, ur: NEGATIVE mg/dL
Specific Gravity, Urine: 1.004 — ABNORMAL LOW (ref 1.005–1.030)
Squamous Epithelial / HPF: NONE SEEN (ref 0–5)
pH: 6 (ref 5.0–8.0)

## 2021-03-10 LAB — COMPREHENSIVE METABOLIC PANEL
ALT: 32 U/L (ref 0–44)
AST: 33 U/L (ref 15–41)
Albumin: 3.8 g/dL (ref 3.5–5.0)
Alkaline Phosphatase: 86 U/L (ref 38–126)
Anion gap: 6 (ref 5–15)
BUN: 25 mg/dL — ABNORMAL HIGH (ref 8–23)
CO2: 29 mmol/L (ref 22–32)
Calcium: 9.3 mg/dL (ref 8.9–10.3)
Chloride: 97 mmol/L — ABNORMAL LOW (ref 98–111)
Creatinine, Ser: 0.92 mg/dL (ref 0.44–1.00)
GFR, Estimated: >60 mL/min (ref >60)
Glucose, Bld: 139 mg/dL — ABNORMAL HIGH (ref 70–99)
Potassium: 3.7 mmol/L (ref 3.5–5.1)
Sodium: 132 mmol/L — ABNORMAL LOW (ref 135–145)
Total Bilirubin: 0.8 mg/dL (ref 0.3–1.2)
Total Protein: 6.8 g/dL (ref 6.5–8.1)

## 2021-03-10 LAB — CBC
HCT: 38.8 % (ref 36.0–46.0)
Hemoglobin: 12.7 g/dL (ref 12.0–15.0)
MCH: 30.5 pg (ref 26.0–34.0)
MCHC: 32.7 g/dL (ref 30.0–36.0)
MCV: 93.3 fL (ref 80.0–100.0)
Platelets: 172 10*3/uL (ref 150–400)
RBC: 4.16 MIL/uL (ref 3.87–5.11)
RDW: 13.2 % (ref 11.5–15.5)
WBC: 6.1 10*3/uL (ref 4.0–10.5)
nRBC: 0 % (ref 0.0–0.2)

## 2021-03-10 LAB — LIPASE, BLOOD: Lipase: 30 U/L (ref 11–51)

## 2021-03-10 NOTE — ED Triage Notes (Signed)
Pt presents to ER c/o "burning all over my abdomen." Pt states she has been having digestive issues for quite some time and has had the burning and stinging sensation in her abdomen for last 4 days.  Pt states she has tried PPIs and Carafate without success.  Pt states she has seen GI and they are still ion the process of running more tests on her. Pt A&O x4 at this time in NAD.

## 2021-03-11 ENCOUNTER — Telehealth: Payer: Self-pay | Admitting: Gastroenterology

## 2021-03-11 ENCOUNTER — Emergency Department: Payer: Medicare Other

## 2021-03-11 ENCOUNTER — Emergency Department
Admission: EM | Admit: 2021-03-11 | Discharge: 2021-03-11 | Disposition: A | Discharge status: Home / Self Care | Payer: Medicare Other | Attending: Emergency Medicine | Admitting: Emergency Medicine

## 2021-03-11 DIAGNOSIS — R188 Other ascites: Secondary | ICD-10-CM

## 2021-03-11 DIAGNOSIS — R109 Unspecified abdominal pain: Secondary | ICD-10-CM | POA: Diagnosis not present

## 2021-03-11 DIAGNOSIS — R140 Abdominal distension (gaseous): Secondary | ICD-10-CM | POA: Diagnosis not present

## 2021-03-11 LAB — HEPATITIS PANEL, ACUTE
HCV Ab: NONREACTIVE
Hep A IgM: NONREACTIVE
Hep B C IgM: NONREACTIVE
Hepatitis B Surface Ag: NONREACTIVE

## 2021-03-11 MED ORDER — IOHEXOL 300 MG/ML  SOLN
75.0000 mL | Freq: Once | INTRAMUSCULAR | Status: AC | PRN
  Administered 2021-03-11: 75 mL via INTRAVENOUS

## 2021-03-11 MED ORDER — SODIUM CHLORIDE 0.9 % IV BOLUS
500.0000 mL | Freq: Once | INTRAVENOUS | Status: AC
  Administered 2021-03-11: 500 mL via INTRAVENOUS

## 2021-03-11 NOTE — Telephone Encounter (Signed)
Patient came into office , she states she needs appointment with Dr T sooner than September due to her conddition and pain. Please call to advise.

## 2021-03-11 NOTE — ED Provider Notes (Signed)
[facility] Emergency Department Provider Note  ____________________________________________   Event Date/Time   First MD Initiated Contact with Patient 03/11/21 0136     (approximate)  I have reviewed the triage vital signs and the nursing notes.   HISTORY  Chief Complaint Abdominal Pain    HPI Audrey Hayden is a 70 y.o. female with history of chronic abdominal issues who comes in with continued abdominal pain.  Patient reports having a lot of issues with her abdomen.  Reports pain after eating and needing to eat every 2 hours to prevent passing out.  Patient is being worked up by GI who are getting pancreatic enzyme levels done tomorrow.  She is been on PPI and Carafate without much success.  GI had recommended a colonoscopy but she is nervous to do this due to stating that she cannot go that long without eating.  She does report some weight loss.  She is seen in nutrition currently.  She reports that the pain is similar with the last few days but may be slightly worsening she is just getting more frustrated about it which is why she came in today, intermittent, worse with eating, nothing makes it better.          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   Osteoporosis 06/19/2015   Abnormal Pap smear of cervix 06/19/2015   Elevated HDL 06/19/2015   History of gastritis 11/08/2014   Cough, persistent  08/14/2014    Past Surgical History:  Procedure Laterality Date   BREAST BIOPSY Left    neg   COLONOSCOPY  2004   ESOPHAGOGASTRODUODENOSCOPY (EGD) WITH PROPOFOL N/A 12/05/2020   Procedure: ESOPHAGOGASTRODUODENOSCOPY (EGD) WITH PROPOFOL;  Surgeon: Virgel Manifold, MD;  Location: [facility] ENDOSCOPY;  Service: Endoscopy;  Laterality: N/A;   TONSILLECTOMY     UPPER GI ENDOSCOPY  12/2018    Prior to Admission medications   Medication Sig Start Date End Date Taking? Authorizing Provider  b complex vitamins tablet Take 1 tablet by mouth 2 (two) times a day.    [provider]  Calcium-Phosphorus-Vitamin D (CITRACAL +D3 PO) Take 2 tablets by mouth 2 (two) times daily.    [provider]  cetirizine (ZYRTEC) 10 MG tablet Take 10 mg by mouth daily.    [provider]  Magnesium 250 MG TABS Take 1 tablet by mouth in the morning and at bedtime.    [provider]  Multiple Vitamin (MULTI-VITAMIN) tablet Take 1 tablet by mouth daily.    [provider]  omeprazole (PRILOSEC) 20 MG capsule Take 1 capsule (20 mg total) by mouth daily. Patient not taking: Reported on 03/07/2021 03/06/21   Virgel Manifold, MD  PANCREATIN PO Take 1,300 mg by mouth 3 (three) times daily.     [provider]  Quercetin 250 MG TABS Take 500 mg by mouth 2 (two) times daily.  [provider]  ranitidine (ZANTAC) 75 MG tablet Take 1 tablet (75 mg total) by mouth 2 (two) times daily. 03/07/21   Glean Hess, MD    Allergies Eggs or egg-derived products, Amoxicillin, Cefdinir, Clarithromycin, Codeine, Hydromorphone hcl, Hydromorphone hcl, Meperidine, Moxifloxacin, Nsaids, Omeprazole, Other, Prednisone, Pseudoephedrine hcl, Ranitidine, Salicylates, Aspirin, Dexilant [dexlansoprazole], Doxycycline, and Montelukast sodium  Family History  Problem Relation Age of Onset   Hypertension Mother    CAD Father    Heart attack Father    Breast cancer Neg Hx    Colon  cancer Neg Hx     Social History Social History   Tobacco Use   Smoking status: Former    Packs/day: 1.50    Years: 5.00    Pack years: 7.50    Types: Cigarettes    Start date: 38    Quit date: 04/02/1975    Years since quitting: 45.9   Smokeless tobacco: Never   Tobacco comments:    smoking cessation materials not required  Vaping Use   Vaping Use: Never used  Substance Use Topics   Alcohol use: No    Alcohol/week: 0.0 standard drinks   Drug use: No      Review of Systems Constitutional: No fever/chills Eyes: No visual changes. ENT: No sore throat. Cardiovascular: Denies chest pain. Respiratory: Denies shortness of breath. Gastrointestinal: Positive abdominal pain Genitourinary: Negative for dysuria. Musculoskeletal: Negative for back pain. Skin: Negative for rash. Neurological: Negative for headaches, focal weakness or numbness. All other ROS negative ____________________________________________   PHYSICAL EXAM:  VITAL SIGNS: ED Triage Vitals  Enc Vitals Group     BP 03/10/21 1916 125/66     Pulse Rate 03/10/21 1916 72     Resp 03/10/21 1916 18     Temp 03/10/21 1916 97.9 F (36.6 C)     Temp Source 03/10/21 1916 Oral     SpO2 03/10/21 1916 100 %     Weight 03/10/21 1916 115 lb (52.2 kg)     Height 03/10/21 1916 5\' 8"  (1.727 m)     Head Circumference --      Peak Flow --      Pain Score 03/10/21 1925 8     Pain Loc --      Pain Edu? --      Excl. in [place]? --     Constitutional: Alert and oriented. Well appearing and in no acute distress. Eyes: Conjunctivae are normal. EOMI. Head: Atraumatic. Nose: No congestion/rhinnorhea. Mouth/Throat: Mucous membranes are moist.   Neck: No stridor. Trachea Midline. FROM Cardiovascular: Normal rate, regular rhythm. Grossly normal heart sounds.  Good peripheral circulation. Respiratory: Normal respiratory effort.  No retractions. Lungs CTAB. Gastrointestinal: Slight tenderness throughout her abdomen . No  distention. No abdominal bruits.  Musculoskeletal: No lower extremity tenderness nor edema.  No joint effusions. Neurologic:  Normal speech and language. No gross focal neurologic deficits are appreciated.  Skin:  Skin is warm, dry and intact. No rash noted. Psychiatric: Mood and affect are normal. Speech and behavior are normal. GU: Deferred   ____________________________________________   LABS (all labs ordered are listed, but only abnormal results are displayed)  Labs Reviewed  COMPREHENSIVE METABOLIC PANEL - Abnormal; Notable for the following components:      Result Value   Sodium 132 (*)    Chloride 97 (*)    Glucose, Bld 139 (*)    BUN 25 (*)    All other components within normal limits  URINALYSIS, COMPLETE (UACMP) WITH  MICROSCOPIC - Abnormal; Notable for the following components:   Color, Urine STRAW (*)    APPearance CLEAR (*)    Specific Gravity, Urine 1.004 (*)    Hgb urine dipstick SMALL (*)    All other components within normal limits  LIPASE, BLOOD  CBC   ____________________________________________  RADIOLOGY I  Official radiology report(s): CT ABDOMEN PELVIS W CONTRAST  Result Date: 03/11/2021 CLINICAL DATA:  70 year old female with abdominal pain. EXAM: CT ABDOMEN AND PELVIS WITH CONTRAST TECHNIQUE: Multidetector CT imaging of the abdomen and pelvis was performed using the standard protocol following bolus administration of intravenous contrast. CONTRAST:  58mL OMNIPAQUE IOHEXOL 300 MG/ML  SOLN COMPARISON:  CT abdomen pelvis dated 05/02/2019. FINDINGS: Lower chest: Bibasilar linear scarring and bronchiectasis. Scattered nodular densities may be chronic or represent recurrent atypical infection. Clinical correlation is recommended. No intra-abdominal free air. Small ascites. Hepatobiliary: There is slight irregularity of the liver contour which may represent early changes of cirrhosis. There is mild periportal edema with slight heterogeneous enhancement the  liver. Correlation with LFTs recommended to evaluate for possibility of hepatitis. The gallbladder is unremarkable. Pancreas: Unremarkable. No pancreatic ductal dilatation or surrounding inflammatory changes. Spleen: Normal in size without focal abnormality. Adrenals/Urinary Tract: The adrenal glands unremarkable. Several nonobstructing left renal inferior pole calculi measure up to 5 mm. No hydronephrosis. The right kidney is unremarkable. There is symmetric enhancement and excretion of contrast by both kidneys. The visualized ureters and urinary bladder appear unremarkable. Stomach/Bowel: There is no bowel obstruction or active inflammation. The appendix is not visualized with certainty. No inflammatory changes identified in the right lower quadrant. Vascular/Lymphatic: Mild aortoiliac atherosclerotic disease. The IVC is unremarkable. No portal venous gas. There is no adenopathy. Reproductive: The uterus is anteverted and grossly unremarkable. No adnexal masses. Other: None Musculoskeletal: Osteopenia with degenerative changes of the spine. No acute osseous pathology. IMPRESSION: 1. Periportal edema with findings concerning for hepatitis on a background possible mild cirrhosis. Correlation with clinical exam and LFTs recommended. 2. No bowel obstruction. 3. Small ascites. 4. Nonobstructing left renal inferior pole calculi. No hydronephrosis. 5. Chronic changes of the lung bases versus recurrent atypical pneumonia. 6. Aortic Atherosclerosis (ICD10-I70.0). Electronically Signed   By: Anner Crete M.D.   On: 03/11/2021 03:08    ____________________________________________   PROCEDURES  Procedure(s) performed (including Critical Care):  Procedures   ____________________________________________   INITIAL IMPRESSION / ASSESSMENT AND PLAN / ED COURSE  Audrey Hayden was evaluated in Emergency Department on 03/11/2021 for the symptoms described in the history of present illness. She was evaluated  in the context of the global COVID-19 pandemic, which necessitated consideration that the patient might be at risk for infection with the SARS-CoV-2 virus that causes COVID-19. Institutional protocols and algorithms that pertain to the evaluation of patients at risk for COVID-19 are in a state of rapid change based on information released by regulatory bodies including the CDC and federal and state organizations. These policies and algorithms were followed during the patient's care in the ED.    Patient is a 70 year old with chronic abdominal issues but is not had a CT scan in over 2 years.  Given that she is reporting weight loss we discussed repeat CT today to make sure no evidence of mass, obstruction, cancer.  I discussed with patient however that given this is been going on for so long that we are limited really to doing a CT only.  I did recommend that she needs to have a colonoscopy done.  Labs are ordered and she has a slightly low sodium and chloride which have given her some fluids for  Patient CT scan was concerning for either hepatitis or cirrhosis but to correlate with LFTs.  His LFTs are normal.  We will send hepatitis panel.  Patient denies any Tylenol usage.  Hepatitis panel is pending.  The also reports some small ascites.  Do not think this is SBP.  Afebrile, normal white count and this pain is been going on for a long time.    Patient will follow-up with GI in MyChart for hepatitis panel.  Patient feels comfortable with discharge home at this time.   ____________________________________________   FINAL CLINICAL IMPRESSION(S) / ED DIAGNOSES   Final diagnoses:  Abdominal pain, unspecified abdominal location  Other ascites      MEDICATIONS GIVEN DURING THIS VISIT:  Medications  sodium chloride 0.9 % bolus 500 mL (0 mLs Intravenous Stopped 03/11/21 0309)  iohexol (OMNIPAQUE) 300 MG/ML solution 75 mL (75 mLs Intravenous Contrast Given 03/11/21 0251)     ED Discharge Orders      None        Note:  This document was prepared using Dragon voice recognition software and may include unintentional dictation errors.    Vanessa [place], MD 03/11/21 [phone]

## 2021-03-11 NOTE — Discharge Instructions (Addendum)
Your CT scan is as below.  Please follow this up with your GI doctor.  I do also recommend you get a colonoscopy and further work-up as directed by GI for your CT.  IMPRESSION: 1. Periportal edema with findings concerning for hepatitis on a background possible mild cirrhosis. Correlation with clinical exam and LFTs recommended. 2. No bowel obstruction. 3. Small ascites. 4. Nonobstructing left renal inferior pole calculi. No hydronephrosis. 5. Chronic changes of the lung bases versus recurrent atypical pneumonia. 6. Aortic Atherosclerosis (ICD10-I70.0).

## 2021-03-13 ENCOUNTER — Telehealth: Payer: Self-pay | Admitting: Dietician

## 2021-03-13 ENCOUNTER — Telehealth: Payer: Self-pay

## 2021-03-13 NOTE — Telephone Encounter (Signed)
Returned voicemail message from patient regarding any additional diet advice given recent diagnosis of mild cirrhosis. She reports ongoing abdominal pain and bloating (ascites also part of diagnosis) Advised patient to continue with frequent meals/ snacks, low-moderate fat food choices, emphasis on protein intake with vegetables and whole grains.  Encouraged additional supplementation with protein/ meal replacement shakes to ensure adequate nutrition and promote weight gain.  Advised 2000mg  sodium or less daily.  Will send menus for cirrhosis from [place] by mail, per patient request. MNT follow up is scheduled for 04/2021. Provided patient with phone number for [facility] in case of question during time when [facility] RD is out of office.

## 2021-03-13 NOTE — Telephone Encounter (Signed)
Patient needs to discuss her questions with her GI doctor at her upcoming appt on July 20th. Dr. Army Melia is aware of her diagnosis.

## 2021-03-13 NOTE — Telephone Encounter (Signed)
Pt returned my call. Scheduled f/u for Wed July 20 at 1pm

## 2021-03-13 NOTE — Telephone Encounter (Signed)
Copied from [place] [id number]. Topic: General - Other >> Mar 13, 2021  3:06 PM Alanda Slim E wrote: Reason for CRM: Pt was seen in ER and was advised of having cirrhosis of the liver /Pt wanted to make Dr. Darlyn Chamber and wanted to speak with the nurse to ask so questions / please advise

## 2021-03-13 NOTE — Telephone Encounter (Signed)
Called pt, no answer VM full...   I wanted to offer appt for Thursday July 21st at 1pm to fit in, and will leave the Sept appt as is for now just in case she still may need it

## 2021-03-15 LAB — PANCREATIC ELASTASE, FECAL: Pancreatic Elastase, Fecal: >500 ug Elast./g (ref >200)

## 2021-03-20 ENCOUNTER — Other Ambulatory Visit: Payer: Self-pay

## 2021-03-20 ENCOUNTER — Encounter: Payer: Self-pay | Admitting: Gastroenterology

## 2021-03-20 ENCOUNTER — Ambulatory Visit (INDEPENDENT_AMBULATORY_CARE_PROVIDER_SITE_OTHER): Payer: Medicare Other | Admitting: Gastroenterology

## 2021-03-20 VITALS — BP 111/63 | HR 77 | Temp 97.6°F | Ht 68.0 in | Wt 115.0 lb

## 2021-03-20 DIAGNOSIS — R634 Abnormal weight loss: Secondary | ICD-10-CM

## 2021-03-20 DIAGNOSIS — K760 Fatty (change of) liver, not elsewhere classified: Secondary | ICD-10-CM | POA: Diagnosis not present

## 2021-03-20 DIAGNOSIS — K759 Inflammatory liver disease, unspecified: Secondary | ICD-10-CM | POA: Diagnosis not present

## 2021-03-20 NOTE — Patient Instructions (Signed)
Your ultrasound has been scheduled for April 12, 2021 at [facility] entrance  Arrive at 10:00 am for registration.   You cannot have anything to eat or drink after 12 midnight the day before  If you need to reschedule, please call [phone]

## 2021-03-20 NOTE — Progress Notes (Signed)
Audrey Antigua, MD [address]  [place]  [place], [place] [address]  Main: [phone]  Fax: [phone]   Primary Care Physician: Glean Hess, MD   Chief complaint: ER follow-up  HPI: Audrey Hayden is a 70 y.o. female here for follow-up.  And her main complaint continues to be inability to gain weight.  She is seeing a nutritionist for this and has pages of notes in regard to her diet.  She does report anxiety and is considering seeing a psychiatrist.  She went to the ER recently and a CT scan showed periportal edema with findings concerning for hepatitis a background of possible mild cirrhosis.  She has no previous history of cirrhosis or clinical symptoms to suggest cirrhosis in the past.  Since then she has been on the Internet researching and states she is trying to follow a "liver specific" diet.   ROS: All ROS reviewed and negative except as per HPI   Past Medical History:  Diagnosis Date   Allergy    Bronchiectasis ([place])    GERD (gastroesophageal reflux disease)    Gross hematuria 02/25/2019   IBS (irritable bowel syndrome)    Osteoporosis     Past Surgical History:  Procedure Laterality Date   BREAST BIOPSY Left    neg   COLONOSCOPY  2004   ESOPHAGOGASTRODUODENOSCOPY (EGD) WITH PROPOFOL N/A 12/05/2020   Procedure: ESOPHAGOGASTRODUODENOSCOPY (EGD) WITH PROPOFOL;  Surgeon: Virgel Manifold, MD;  Location: [facility] ENDOSCOPY;  Service: Endoscopy;  Laterality: N/A;   TONSILLECTOMY     UPPER GI ENDOSCOPY  12/2018    Prior to Admission medications   Medication Sig Start Date End Date Taking? Authorizing Provider  b complex vitamins tablet Take 1 tablet by mouth 2 (two) times a day.   Yes [provider]  Calcium-Phosphorus-Vitamin D (CITRACAL +D3 PO) Take 2 tablets by mouth 2 (two) times daily.   Yes [provider]  cetirizine (ZYRTEC) 10 MG tablet Take 10 mg by mouth daily.   Yes [provider]  Magnesium 250 MG  TABS Take 1 tablet by mouth in the morning and at bedtime.   Yes [provider]  Multiple Vitamin (MULTI-VITAMIN) tablet Take 1 tablet by mouth daily.   Yes [provider]  omeprazole (PRILOSEC) 20 MG capsule Take 1 capsule (20 mg total) by mouth daily. 03/06/21  Yes Audrey Hayden B, MD  PANCREATIN PO Take 1,300 mg by mouth 3 (three) times daily.    Yes [provider]  Quercetin 250 MG TABS Take 500 mg by mouth 2 (two) times daily.   Yes [provider]  ranitidine (ZANTAC) 75 MG tablet Take 1 tablet (75 mg total) by mouth 2 (two) times daily. 03/07/21  Yes Glean Hess, MD    Family History  Problem Relation Age of Onset   Hypertension Mother    CAD Father    Heart attack Father    Breast cancer Neg Hx    Colon cancer Neg Hx      Social History   Tobacco Use   Smoking status: Former    Packs/day: 1.50    Years: 5.00    Pack years: 7.50    Types: Cigarettes    Start date: 30    Quit date: 04/02/1975    Years since quitting: 45.9   Smokeless tobacco: Never   Tobacco comments:    smoking cessation materials not required  Vaping Use   Vaping Use: Never used  Substance Use Topics   Alcohol use: No    Alcohol/week: 0.0 standard drinks   Drug use: No    Allergies as of 03/20/2021 - Review Complete 03/20/2021  Allergen Reaction Noted   Eggs or egg-derived products Itching and Rash 07/21/2019   Amoxicillin Other (See Comments) 09/18/2014   Cefdinir  09/18/2014   Clarithromycin Other (See Comments) 09/18/2014   Codeine Other (See Comments) 09/18/2014   Hydromorphone hcl Other (See Comments) 09/18/2014   Hydromorphone hcl Other (See Comments) 06/10/2020   Meperidine Other (See Comments) 09/18/2014   Moxifloxacin Other (See Comments) 11/14/2015   Nsaids  09/18/2014   Omeprazole Other (See Comments) 06/19/2015   Other Other (See Comments) 09/18/2014   Prednisone Other (See Comments) 09/18/2014   Pseudoephedrine hcl  09/18/2014    Ranitidine  09/03/7251   Salicylates Hives 66/44/0347   Aspirin Hives and Rash 09/18/2014   Dexilant [dexlansoprazole] Rash 09/12/2019   Doxycycline Other (See Comments) 09/14/2017   Montelukast sodium Palpitations 09/18/2014    Physical Examination:  Constitutional: General:   Alert,  Well-developed, well-nourished, pleasant and cooperative in NAD BP 111/63   Pulse 77   Temp 97.6 F (36.4 C) (Oral)   Ht 5\' 8"  (1.727 m)   Wt 115 lb (52.2 kg)   BMI 17.49 kg/m   Respiratory: Normal respiratory effort  Gastrointestinal:  Soft, non-tender and non-distended without masses, hepatosplenomegaly or hernias noted.  No guarding or rebound tenderness.     Cardiac: No clubbing or edema.  No cyanosis. Normal posterior tibial pedal pulses noted.  Psych:  Alert and cooperative. Normal mood and affect.  Musculoskeletal:  Normal gait. Head normocephalic, atraumatic. Symmetrical without gross deformities. 5/5 Lower extremity strength bilaterally.  Skin: Warm. Intact without significant lesions or rashes. No jaundice.  Neck: Supple, trachea midline  Lymph: No cervical lymphadenopathy  Psych:  Alert and oriented x3, Alert and cooperative. Normal mood and affect.  Labs: CMP     Component Value Date/Time   NA 132 (L) 03/10/2021 1929   NA 137 02/25/2019 1410   NA 138 12/05/2013 0800   K 3.7 03/10/2021 1929   K 3.6 12/05/2013 0800   CL 97 (L) 03/10/2021 1929   CL 105 12/05/2013 0800   CO2 29 03/10/2021 1929   CO2 28 12/05/2013 0800   GLUCOSE 139 (H) 03/10/2021 1929   GLUCOSE 111 (H) 12/05/2013 0800   BUN 25 (H) 03/10/2021 1929   BUN 18 02/25/2019 1410   BUN 9 12/05/2013 0800   CREATININE 0.92 03/10/2021 1929   CREATININE 0.82 12/05/2013 0800   CALCIUM 9.3 03/10/2021 1929   CALCIUM 8.5 12/05/2013 0800   PROT 6.8 03/10/2021 1929   PROT 6.9 02/25/2019 1410   PROT 6.6 12/05/2013 0800   ALBUMIN 3.8 03/10/2021 1929   ALBUMIN 4.5 02/25/2019 1410   ALBUMIN 3.4 12/05/2013 0800   AST  33 03/10/2021 1929   AST 29 12/05/2013 0800   ALT 32 03/10/2021 1929   ALT 27 12/05/2013 0800   ALKPHOS 86 03/10/2021 1929   ALKPHOS 66 12/05/2013 0800   BILITOT 0.8 03/10/2021 1929   BILITOT 0.5 02/25/2019 1410   BILITOT 1.4 (H) 12/05/2013 0800   GFRNONAA >60 03/10/2021 1929   GFRNONAA >60 12/05/2013 0800   GFRAA 72 02/25/2019 1410   GFRAA >60 12/05/2013 0800   Lab Results  Component Value Date   WBC 6.1 03/10/2021   HGB 12.7 03/10/2021   HCT 38.8 03/10/2021   MCV 93.3 03/10/2021   PLT 172 03/10/2021  Imaging Studies: CT abdomen IMPRESSION: 1. Periportal edema with findings concerning for hepatitis on a background possible mild cirrhosis. Correlation with clinical exam and LFTs recommended. 2. No bowel obstruction. 3. Small ascites. 4. Nonobstructing left renal inferior pole calculi. No hydronephrosis. 5. Chronic changes of the lung bases versus recurrent atypical pneumonia. 6. Aortic Atherosclerosis (ICD10-I70.0).     Electronically Signed   By: Anner Crete M.D.   On: 03/11/2021 03:08 Assessment and Plan:   Audrey Hayden is a 70 y.o. y/o female here for follow-up from the ER for abnormal CT scan results  Patient has no prior history of cirrhosis or liver disease and does not have any clinical symptoms of such either  Her liver enzymes were completely normal at the time of the ER visit.  Her platelets are normal as well.  This would all go against cirrhosis.  I will check INR and FibroSure to confirm as well  Check right upper quadrant ultrasound as well for another imaging of the liver  I have encouraged her to make an appointment with a psychiatrist given her anxiety issues and she understands that these are likely playing into her concentration on inability to gain weight.  Continue to work with a nutritionist.  She has refused colonoscopy and has Cologuard testing done previously  EGD has been completed as noted before.  Reconsider colonoscopy  if symptoms continue  Trend of her weights to show that she has lost about 13 pounds since December 2021  Dr Audrey Hayden

## 2021-03-21 ENCOUNTER — Emergency Department (HOSPITAL_COMMUNITY)
Admission: EM | Admit: 2021-03-21 | Discharge: 2021-03-21 | Disposition: A | Discharge status: Home / Self Care | Payer: Medicare Other | Attending: Emergency Medicine | Admitting: Emergency Medicine

## 2021-03-21 ENCOUNTER — Other Ambulatory Visit: Payer: Self-pay

## 2021-03-21 ENCOUNTER — Telehealth: Payer: Self-pay | Admitting: Internal Medicine

## 2021-03-21 DIAGNOSIS — R112 Nausea with vomiting, unspecified: Secondary | ICD-10-CM | POA: Diagnosis not present

## 2021-03-21 DIAGNOSIS — R195 Other fecal abnormalities: Secondary | ICD-10-CM | POA: Diagnosis not present

## 2021-03-21 DIAGNOSIS — Z5321 Procedure and treatment not carried out due to patient leaving prior to being seen by health care provider: Secondary | ICD-10-CM | POA: Diagnosis not present

## 2021-03-21 DIAGNOSIS — R109 Unspecified abdominal pain: Secondary | ICD-10-CM | POA: Diagnosis not present

## 2021-03-21 NOTE — Telephone Encounter (Signed)
Please review. Last office visit stated that a low-dose SSRI in the future would be considered if needed.  KP

## 2021-03-21 NOTE — ED Notes (Signed)
Patient decided to leave.   

## 2021-03-21 NOTE — Telephone Encounter (Signed)
Pt called to see if Dr. Army Melia can still prescribe her some low dose Lexapro or wellbutrin / pt stated Dr. Army Melia was going to prescribe this at last visit but pt didn't want to try it at that time and wanted to wait it out/ please advise if Rx can be called into pharmacy

## 2021-03-22 ENCOUNTER — Other Ambulatory Visit: Payer: Self-pay | Admitting: Internal Medicine

## 2021-03-22 DIAGNOSIS — F419 Anxiety disorder, unspecified: Secondary | ICD-10-CM

## 2021-03-22 LAB — NASH FIBROSURE
ALPHA 2-MACROGLOBULINS, QN: 199 mg/dL (ref 110–276)
ALT (SGPT) P5P: 30 IU/L (ref 0–40)
AST (SGOT) P5P: 30 IU/L (ref 0–40)
Apolipoprotein A-1: 174 mg/dL (ref 116–209)
Bilirubin, Total: 0.5 mg/dL (ref 0.0–1.2)
Cholesterol, Total: 189 mg/dL (ref 100–199)
Fibrosis Score: 0.25 — ABNORMAL HIGH (ref 0.00–0.21)
GGT: 42 IU/L (ref 0–60)
Glucose: 80 mg/dL (ref 65–99)
Haptoglobin: 115 mg/dL (ref 37–355)
Height: 68 in
NASH Score: 0.25
Steatosis Score: 0.16 (ref 0.00–0.30)
Triglycerides: 60 mg/dL (ref 0–149)
Weight: 113 [lb_av]

## 2021-03-22 LAB — PROTIME-INR
INR: 1.1 (ref 0.9–1.2)
Prothrombin Time: 11 s (ref 9.1–12.0)

## 2021-03-22 MED ORDER — ESCITALOPRAM OXALATE 10 MG PO TABS: 10.0000 mg | ORAL_TABLET | Freq: Every day | ORAL | 1 refills | Status: DC

## 2021-03-22 NOTE — Telephone Encounter (Signed)
Called pt informed her that Dr. Army Hayden sent in [place] to pharmacy. Told pt to take a 1/2 tablet for the first 4 days then take 1 per day. Pt verbalized understanding.  kP

## 2021-03-26 ENCOUNTER — Emergency Department: Payer: Medicare Other

## 2021-03-26 ENCOUNTER — Emergency Department
Admission: EM | Admit: 2021-03-26 | Discharge: 2021-03-26 | Disposition: A | Discharge status: Home / Self Care | Payer: Medicare Other | Attending: Emergency Medicine | Admitting: Emergency Medicine

## 2021-03-26 ENCOUNTER — Telehealth: Payer: Self-pay

## 2021-03-26 ENCOUNTER — Other Ambulatory Visit: Payer: Self-pay

## 2021-03-26 ENCOUNTER — Encounter: Payer: Self-pay | Admitting: Intensive Care

## 2021-03-26 DIAGNOSIS — R0789 Other chest pain: Secondary | ICD-10-CM | POA: Diagnosis not present

## 2021-03-26 DIAGNOSIS — J479 Bronchiectasis, uncomplicated: Secondary | ICD-10-CM | POA: Diagnosis not present

## 2021-03-26 DIAGNOSIS — Z87891 Personal history of nicotine dependence: Secondary | ICD-10-CM | POA: Diagnosis not present

## 2021-03-26 DIAGNOSIS — R079 Chest pain, unspecified: Secondary | ICD-10-CM | POA: Diagnosis not present

## 2021-03-26 LAB — BASIC METABOLIC PANEL
Anion gap: 8 (ref 5–15)
BUN: 20 mg/dL (ref 8–23)
CO2: 31 mmol/L (ref 22–32)
Calcium: 9.2 mg/dL (ref 8.9–10.3)
Chloride: 95 mmol/L — ABNORMAL LOW (ref 98–111)
Creatinine, Ser: 0.82 mg/dL (ref 0.44–1.00)
GFR, Estimated: >60 mL/min (ref >60)
Glucose, Bld: 113 mg/dL — ABNORMAL HIGH (ref 70–99)
Potassium: 4 mmol/L (ref 3.5–5.1)
Sodium: 134 mmol/L — ABNORMAL LOW (ref 135–145)

## 2021-03-26 LAB — HEPATIC FUNCTION PANEL
ALT: 37 U/L (ref 0–44)
AST: 34 U/L (ref 15–41)
Albumin: 4.1 g/dL (ref 3.5–5.0)
Alkaline Phosphatase: 85 U/L (ref 38–126)
Bilirubin, Direct: 0.2 mg/dL (ref 0.0–0.2)
Indirect Bilirubin: 1.2 mg/dL — ABNORMAL HIGH (ref 0.3–0.9)
Total Bilirubin: 1.4 mg/dL — ABNORMAL HIGH (ref 0.3–1.2)
Total Protein: 7.1 g/dL (ref 6.5–8.1)

## 2021-03-26 LAB — CBC
HCT: 40 % (ref 36.0–46.0)
Hemoglobin: 13.5 g/dL (ref 12.0–15.0)
MCH: 31.3 pg (ref 26.0–34.0)
MCHC: 33.8 g/dL (ref 30.0–36.0)
MCV: 92.6 fL (ref 80.0–100.0)
Platelets: 186 10*3/uL (ref 150–400)
RBC: 4.32 MIL/uL (ref 3.87–5.11)
RDW: 13 % (ref 11.5–15.5)
WBC: 5.1 10*3/uL (ref 4.0–10.5)
nRBC: 0 % (ref 0.0–0.2)

## 2021-03-26 LAB — LIPASE, BLOOD: Lipase: 33 U/L (ref 11–51)

## 2021-03-26 LAB — D-DIMER, QUANTITATIVE: D-Dimer, Quant: 0.33 ug/mL-FEU (ref 0.00–0.50)

## 2021-03-26 LAB — TROPONIN I (HIGH SENSITIVITY)
Troponin I (High Sensitivity): 3 ng/L (ref <18)
Troponin I (High Sensitivity): 4 ng/L (ref <18)

## 2021-03-26 NOTE — ED Triage Notes (Signed)
Pt c/o chest tightness that radiates to jaw. Started last night. Came in this morning due to pain continuing through the night and to this AM. Also c/o burning abdominal pain. Reports confirmed mild cirrhosis of liver recently.

## 2021-03-26 NOTE — Telephone Encounter (Signed)
Patient is calling because she states she was recently diagnose with Cirrhosis of the liver. She states since last night she has had lower abdominal pain, shortness of breath, tingling and pain in her jaw, tingling in both arms, and irregular heart rate. Advised patient she needed to go the ER to get evaluated

## 2021-03-26 NOTE — ED Provider Notes (Signed)
[facility] Emergency Department Provider Note ____________________________________________   Event Date/Time   First MD Initiated Contact with Patient 03/26/21 1323     (approximate)  I have reviewed the triage vital signs and the nursing notes.  HISTORY  Chief Complaint Chest Pain  HPI Audrey Hayden is a 70 y.o. female history of irritable bowel syndrome, recent evaluation for possible abdominal pain with concern for possible cirrhosis.  Patient reports that she started taking Lexapro about 4 days ago.  First day she took it she felt a little bit anxious, second day she reported feeling a sense of wellbeing.  Patient here today because she has been experiencing some jaw discomfort and chest pain.  She reports that last night she noticed that her jaw felt sore, and then earlier today and a little bit last night she experienced some pain in her upper chest.  The pain was throughout the night.  She also reports it feels like a burning sensation in her upper abdomen and chest.  She does have a history of severe reflux as well that she takes daily medicine for and it feels vaguely similar  Denies any ongoing abdominal pain.  Had a big work-up a couple weeks ago and was diagnosed with possible cirrhosis, but reports she also follow-up with her GI physician Dr. Delice Lesch and had some very reassuring blood work  Currently not having ongoing pain or symptoms.  Does report she also felt little bit short of breath with the that is improved.         Past Medical History:  Diagnosis Date   Allergy    Bronchiectasis ([place])    GERD (gastroesophageal reflux disease)    Gross hematuria 02/25/2019   IBS (irritable bowel syndrome)    Osteoporosis     Patient Active Problem List   Diagnosis Date Noted   Esophageal dysphagia    Schatzki's ring    Gastric erythema    Bronchiectasis ([place]) 08/10/2020   Asymptomatic spider veins of both lower extremities  02/25/2019   Chronic left-sided thoracic back pain 12/09/2017   Anxiety 12/09/2017   Environmental and seasonal allergies 06/19/2015   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/19/2015   Osteoporosis 06/19/2015   Abnormal Pap smear of cervix 06/19/2015   Elevated HDL 06/19/2015   History of gastritis 11/08/2014   Cough, persistent 08/14/2014    Past Surgical History:  Procedure Laterality Date   BREAST BIOPSY Left    neg   COLONOSCOPY  2004   ESOPHAGOGASTRODUODENOSCOPY (EGD) WITH PROPOFOL N/A 12/05/2020   Procedure: ESOPHAGOGASTRODUODENOSCOPY (EGD) WITH PROPOFOL;  Surgeon: Virgel Manifold, MD;  Location: [facility] ENDOSCOPY;  Service: Endoscopy;  Laterality: N/A;   TONSILLECTOMY     UPPER GI ENDOSCOPY  12/2018    Prior to Admission medications   Medication Sig Start Date End Date Taking? Authorizing Provider  b complex vitamins tablet Take 1 tablet by mouth 2 (two) times a day.    [provider]  Calcium-Phosphorus-Vitamin D (CITRACAL +D3 PO) Take 2 tablets by mouth 2 (two) times daily.    [provider]  cetirizine (ZYRTEC) 10 MG tablet Take 10 mg by mouth daily.    [provider]  escitalopram (LEXAPRO) 10 MG tablet Take 1 tablet (10 mg total) by mouth daily. 03/22/21   Glean Hess, MD  Magnesium 250 MG TABS Take 1 tablet by mouth in the morning and at bedtime.    [provider]  Multiple Vitamin (MULTI-VITAMIN)  tablet Take 1 tablet by mouth daily.    [provider]  omeprazole (PRILOSEC) 20 MG capsule Take 1 capsule (20 mg total) by mouth daily. 03/06/21   Virgel Manifold, MD  PANCREATIN PO Take 1,300 mg by mouth 3 (three) times daily.     [provider]  Quercetin 250 MG TABS Take 500 mg by mouth 2 (two) times daily.    [provider]  ranitidine (ZANTAC) 75 MG tablet Take 1 tablet (75 mg total) by mouth 2 (two) times daily. 03/07/21   Glean Hess, MD    Allergies Eggs or egg-derived products,  Amoxicillin, Cefdinir, Clarithromycin, Codeine, Hydromorphone hcl, Hydromorphone hcl, Meperidine, Moxifloxacin, Nsaids, Omeprazole, Other, Prednisone, Pseudoephedrine hcl, Ranitidine, Salicylates, Aspirin, Dexilant [dexlansoprazole], Doxycycline, and Montelukast sodium  Family History  Problem Relation Age of Onset   Hypertension Mother    CAD Father    Heart attack Father    Breast cancer Neg Hx    Colon cancer Neg Hx     Social History Social History   Tobacco Use   Smoking status: Former    Packs/day: 1.50    Years: 5.00    Pack years: 7.50    Types: Cigarettes    Start date: 54    Quit date: 04/02/1975    Years since quitting: 46.0   Smokeless tobacco: Never   Tobacco comments:    smoking cessation materials not required  Vaping Use   Vaping Use: Never used  Substance Use Topics   Alcohol use: No    Alcohol/week: 0.0 standard drinks   Drug use: No    Review of Systems Constitutional: No fever/chills Eyes: No visual changes. ENT: No sore throat.  Has discomfort in her jaw that felt like the muscles were tight, the pain did not necessarily seem connected to the chest pain she experienced Cardiovascular: Denies chest pain now but had chest pain earlier.  Is located in her upper chest also a burning sensation under her sternum.  Respiratory: Denies shortness of breath. Gastrointestinal: No abdominal pain.   Genitourinary: Negative for dysuria. Musculoskeletal: Negative for back pain. Skin: Negative for rash. Neurological: Negative for headaches, areas of focal weakness or numbness.  Denies tremulousness.  Does not feel like her muscles are tight or rigid.    ____________________________________________   PHYSICAL EXAM:  VITAL SIGNS: ED Triage Vitals [03/26/21 1115]  Enc Vitals Group     BP 124/81     Pulse Rate 72     Resp 16     Temp 98.4 F (36.9 C)     Temp Source Oral     SpO2 99 %     Weight 112 lb (50.8 kg)     Height '5\' 8"'$  (1.727 m)     Head  Circumference      Peak Flow      Pain Score 8     Pain Loc      Pain Edu?      Excl. in [place]?     Constitutional: Alert and oriented. Well appearing and in no acute distress.  Appears just slightly anxious.  Somewhat generally underweight, reports she tries to eat a meal every 2 hours and has not lost weight recently is also very hard for her to gain weight she follows closely with GI for this issue as well she reports Eyes: Conjunctivae are normal. Head: Atraumatic. Nose: No congestion/rhinnorhea. Mouth/Throat: Mucous membranes are moist. Neck: No stridor.  Cardiovascular: Normal rate, regular rhythm. Grossly normal  heart sounds.  Good peripheral circulation. Respiratory: Normal respiratory effort.  No retractions. Lungs CTAB. Gastrointestinal: Soft and nontender. No distention.  No evidence or findings are consistent with cirrhotic changes no spine granulomata, no distention no evidence of ascites by clinical exam. Musculoskeletal: No lower extremity tenderness nor edema. Neurologic:  Normal speech and language. No gross focal neurologic deficits are appreciated.  Skin:  Skin is warm, dry and intact. No rash noted. Psychiatric: Mood and affect are normal. Speech and behavior are normal.  ____________________________________________   LABS (all labs ordered are listed, but only abnormal results are displayed)  Labs Reviewed  BASIC METABOLIC PANEL - Abnormal; Notable for the following components:      Result Value   Sodium 134 (*)    Chloride 95 (*)    Glucose, Bld 113 (*)    All other components within normal limits  HEPATIC FUNCTION PANEL - Abnormal; Notable for the following components:   Total Bilirubin 1.4 (*)    Indirect Bilirubin 1.2 (*)    All other components within normal limits  CBC  LIPASE, BLOOD  D-DIMER, QUANTITATIVE (NOT AT [facility])  TROPONIN I (HIGH SENSITIVITY)  TROPONIN I (HIGH SENSITIVITY)   ____________________________________________  EKG  Reviewed  inter 1130 Heart rate 79 QRS 65 9 QTc 400 Normal sinus rhythm, no evidence of acute ischemia or ectopy.  Slight baseline wander.  Favored to have no ischemic change ____________________________________________  RADIOLOGY  DG Chest 2 View  Result Date: 03/26/2021 CLINICAL DATA:  Chest pain. EXAM: CHEST - 2 VIEW COMPARISON:  09/07/2018 FINDINGS: Cardiac silhouette, mediastinal and hilar contours are within normal limits. There is moderate hyperinflation and chronic bronchitic changes and bronchiectasis. Left basilar scarring changes are noted. No definite acute infiltrates or effusions. No pneumothorax. The bony thorax is intact. IMPRESSION: Chronic lung changes but no definite acute overlying pulmonary process. Electronically Signed   By: Marijo Sanes M.D.   On: 03/26/2021 12:15     Chest x-ray reviewed negative for acute finding.  Mild bronchitic changes ____________________________________________   PROCEDURES  Procedure(s) performed: None  Procedures  Critical Care performed: No  ____________________________________________   INITIAL IMPRESSION / ASSESSMENT AND PLAN / ED COURSE  Pertinent labs & imaging results that were available during my care of the patient were reviewed by me and considered in my medical decision making (see chart for details).   Differential diagnosis includes, but is not limited to, ACS, aortic dissection, pulmonary embolism, cardiac tamponade, pneumothorax, pneumonia, pericarditis, myocarditis, GI-related causes including esophagitis/gastritis, and musculoskeletal chest wall pain.  She has no personal history of heart disease.  Reassuring clinical examination.  She does report associated chest discomfort and jaw pain but the 2 do not necessarily seem connected.  Query if this could be secondary to her Lexapro, discussed with her husband risk-benefit of the medication and patient considering discontinuation.  No evidence of acute ACS at this point, normal  troponin EKG nonischemic.  Also given the patient's elicited dyspnea but a low pretest probability for PE will obtain D-dimer to exclude.  Have added LFTs.  Thus far CBC metabolic panel are normal.  No associated abdominal pain, and given the burning nature and history of reflux and gastrointestinal symptoms query if this could be secondary to upper abdominal etiology such as reflux esophagitis etc.  Patient already has planned follow-up and further work-up with Dr. Blenda Bridegroom.   ----------------------------------------- 4:15 PM on 03/26/2021 -----------------------------------------  Patient asymptomatic at this time.  Patient and husband report they would like  to be discharged as patient has special diet and needs to get something to eat every couple of hours.  She resting comfortably in no distress, asymptomatic this time.  Work-up reassuring negative D-dimer with low pretest probability for PE.  Recommended follow-up with cardiology as well as her primary care physician and already has follow-up planned with Dr. Gastroenterology Enriqueta Shutter)  Return precautions and treatment recommendations and follow-up discussed with the patient who is agreeable with the plan.        ____________________________________________   FINAL CLINICAL IMPRESSION(S) / ED DIAGNOSES  Final diagnoses:  Chest pain, atypical        Note:  This document was prepared using Dragon voice recognition software and may include unintentional dictation errors       Delman Kitten, MD 03/26/21 1616

## 2021-03-27 ENCOUNTER — Emergency Department
Admission: EM | Admit: 2021-03-27 | Discharge: 2021-03-27 | Disposition: A | Discharge status: Home / Self Care | Payer: Medicare Other | Attending: Emergency Medicine | Admitting: Emergency Medicine

## 2021-03-27 ENCOUNTER — Other Ambulatory Visit: Payer: Self-pay

## 2021-03-27 DIAGNOSIS — R0789 Other chest pain: Secondary | ICD-10-CM | POA: Diagnosis not present

## 2021-03-27 DIAGNOSIS — Z87891 Personal history of nicotine dependence: Secondary | ICD-10-CM | POA: Diagnosis not present

## 2021-03-27 DIAGNOSIS — R079 Chest pain, unspecified: Secondary | ICD-10-CM

## 2021-03-27 DIAGNOSIS — R42 Dizziness and giddiness: Secondary | ICD-10-CM | POA: Diagnosis not present

## 2021-03-27 LAB — CBC WITH DIFFERENTIAL/PLATELET
Abs Immature Granulocytes: 0.02 10*3/uL (ref 0.00–0.07)
Basophils Absolute: 0 10*3/uL (ref 0.0–0.1)
Basophils Relative: 0 %
Eosinophils Absolute: 0 10*3/uL (ref 0.0–0.5)
Eosinophils Relative: 1 %
HCT: 41.6 % (ref 36.0–46.0)
Hemoglobin: 13.8 g/dL (ref 12.0–15.0)
Immature Granulocytes: 0 %
Lymphocytes Relative: 13 %
Lymphs Abs: 0.9 10*3/uL (ref 0.7–4.0)
MCH: 30.7 pg (ref 26.0–34.0)
MCHC: 33.2 g/dL (ref 30.0–36.0)
MCV: 92.4 fL (ref 80.0–100.0)
Monocytes Absolute: 0.4 10*3/uL (ref 0.1–1.0)
Monocytes Relative: 5 %
Neutro Abs: 5.3 10*3/uL (ref 1.7–7.7)
Neutrophils Relative %: 81 %
Platelets: 187 10*3/uL (ref 150–400)
RBC: 4.5 MIL/uL (ref 3.87–5.11)
RDW: 13 % (ref 11.5–15.5)
WBC: 6.6 10*3/uL (ref 4.0–10.5)
nRBC: 0 % (ref 0.0–0.2)

## 2021-03-27 LAB — BASIC METABOLIC PANEL
Anion gap: 11 (ref 5–15)
BUN: 16 mg/dL (ref 8–23)
CO2: 28 mmol/L (ref 22–32)
Calcium: 9.4 mg/dL (ref 8.9–10.3)
Chloride: 98 mmol/L (ref 98–111)
Creatinine, Ser: 0.76 mg/dL (ref 0.44–1.00)
GFR, Estimated: >60 mL/min (ref >60)
Glucose, Bld: 94 mg/dL (ref 70–99)
Potassium: 3.9 mmol/L (ref 3.5–5.1)
Sodium: 137 mmol/L (ref 135–145)

## 2021-03-27 LAB — TROPONIN I (HIGH SENSITIVITY): Troponin I (High Sensitivity): 4 ng/L (ref <18)

## 2021-03-27 LAB — MAGNESIUM: Magnesium: 2.1 mg/dL (ref 1.7–2.4)

## 2021-03-27 NOTE — ED Provider Notes (Signed)
[facility] Emergency Department Provider Note   ____________________________________________   Event Date/Time   First MD Initiated Contact with Patient 03/27/21 1503     (approximate)  I have reviewed the triage vital signs and the nursing notes.   HISTORY  Chief Complaint Dizziness    HPI Audrey Hayden is a 70 y.o. female with past medical history of GERD, IBS, anxiety, and bronchiectasis who presents to the ED complaining of dizziness.  Patient reports that she has been feeling increasingly lightheaded and dizzy over the past couple of days and is concerned that this could be a side effect from her Lexapro.  She states that her primary care doctor started her on Lexapro 1 week ago for anxiety.  She had been taking 5 mg daily until today, when her dose was increased to 10 mg daily.  She has has been having a burning discomfort in her chest but denies any fevers, cough, or shortness of breath.  She denies any abdominal pain, vomiting, or diarrhea.  She was seen in the ED for similar symptoms yesterday with unremarkable work-up.  She states the symptoms have intensified now that her dose of Lexapro has increased.        Past Medical History:  Diagnosis Date   Allergy    Bronchiectasis ([place])    GERD (gastroesophageal reflux disease)    Gross hematuria 02/25/2019   IBS (irritable bowel syndrome)    Osteoporosis     Patient Active Problem List   Diagnosis Date Noted   Esophageal dysphagia    Schatzki's ring    Gastric erythema    Bronchiectasis ([place]) 08/10/2020   Asymptomatic spider veins of both lower extremities 02/25/2019   Chronic left-sided thoracic back pain 12/09/2017   Anxiety 12/09/2017   Environmental and seasonal allergies 06/19/2015   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/19/2015   Osteoporosis 06/19/2015   Abnormal Pap smear of cervix 06/19/2015   Elevated HDL 06/19/2015   History of gastritis 11/08/2014   Cough,  persistent 08/14/2014    Past Surgical History:  Procedure Laterality Date   BREAST BIOPSY Left    neg   COLONOSCOPY  2004   ESOPHAGOGASTRODUODENOSCOPY (EGD) WITH PROPOFOL N/A 12/05/2020   Procedure: ESOPHAGOGASTRODUODENOSCOPY (EGD) WITH PROPOFOL;  Surgeon: Virgel Manifold, MD;  Location: [facility] ENDOSCOPY;  Service: Endoscopy;  Laterality: N/A;   TONSILLECTOMY     UPPER GI ENDOSCOPY  12/2018    Prior to Admission medications   Medication Sig Start Date End Date Taking? Authorizing Provider  b complex vitamins tablet Take 1 tablet by mouth 2 (two) times a day.    [provider]  Calcium-Phosphorus-Vitamin D (CITRACAL +D3 PO) Take 2 tablets by mouth 2 (two) times daily.    [provider]  cetirizine (ZYRTEC) 10 MG tablet Take 10 mg by mouth daily.    [provider]  escitalopram (LEXAPRO) 10 MG tablet Take 1 tablet (10 mg total) by mouth daily. 03/22/21   Glean Hess, MD  Magnesium 250 MG TABS Take 1 tablet by mouth in the morning and at bedtime.    [provider]  Multiple Vitamin (MULTI-VITAMIN) tablet Take 1 tablet by mouth daily.    [provider]  omeprazole (PRILOSEC) 20 MG capsule Take 1 capsule (20 mg total) by mouth daily. 03/06/21   Virgel Manifold, MD  PANCREATIN PO Take 1,300 mg by mouth 3 (three) times daily.     [provider]  Quercetin  250 MG TABS Take 500 mg by mouth 2 (two) times daily.    [provider]  ranitidine (ZANTAC) 75 MG tablet Take 1 tablet (75 mg total) by mouth 2 (two) times daily. 03/07/21   Glean Hess, MD    Allergies Eggs or egg-derived products, Amoxicillin, Cefdinir, Clarithromycin, Codeine, Hydromorphone hcl, Hydromorphone hcl, Meperidine, Moxifloxacin, Nsaids, Omeprazole, Other, Prednisone, Pseudoephedrine hcl, Ranitidine, Salicylates, Aspirin, Dexilant [dexlansoprazole], Doxycycline, and Montelukast sodium  Family History  Problem Relation Age of Onset    Hypertension Mother    CAD Father    Heart attack Father    Breast cancer Neg Hx    Colon cancer Neg Hx     Social History Social History   Tobacco Use   Smoking status: Former    Packs/day: 1.50    Years: 5.00    Pack years: 7.50    Types: Cigarettes    Start date: 13    Quit date: 04/02/1975    Years since quitting: 46.0   Smokeless tobacco: Never   Tobacco comments:    smoking cessation materials not required  Vaping Use   Vaping Use: Never used  Substance Use Topics   Alcohol use: No    Alcohol/week: 0.0 standard drinks   Drug use: No    Review of Systems  Constitutional: No fever/chills.  Positive for dizziness and lightheadedness. Eyes: No visual changes. ENT: No sore throat. Cardiovascular: Positive for chest pain. Respiratory: Denies shortness of breath. Gastrointestinal: No abdominal pain.  No nausea, no vomiting.  No diarrhea.  No constipation. Genitourinary: Negative for dysuria. Musculoskeletal: Negative for back pain. Skin: Negative for rash. Neurological: Negative for headaches, focal weakness or numbness.  ____________________________________________   PHYSICAL EXAM:  VITAL SIGNS: ED Triage Vitals [03/27/21 1345]  Enc Vitals Group     BP 132/80     Pulse Rate 79     Resp 18     Temp 97.8 F (36.6 C)     Temp Source Oral     SpO2 97 %     Weight 110 lb 3.7 oz (50 kg)     Height '5\' 8"'$  (1.727 m)     Head Circumference      Peak Flow      Pain Score 0     Pain Loc      Pain Edu?      Excl. in [place]?     Constitutional: Alert and oriented. Eyes: Conjunctivae are normal. Head: Atraumatic. Nose: No congestion/rhinnorhea. Mouth/Throat: Mucous membranes are moist. Neck: Normal ROM Cardiovascular: Normal rate, regular rhythm. Grossly normal heart sounds.  2+ radial pulses bilaterally. Respiratory: Normal respiratory effort.  No retractions. Lungs CTAB.  No chest wall tenderness to palpation. Gastrointestinal: Soft and nontender. No  distention. Genitourinary: deferred Musculoskeletal: No lower extremity tenderness nor edema. Neurologic:  Normal speech and language. No gross focal neurologic deficits are appreciated. Skin:  Skin is warm, dry and intact. No rash noted. Psychiatric: Mood and affect are normal. Speech and behavior are normal.  ____________________________________________   LABS (all labs ordered are listed, but only abnormal results are displayed)  Labs Reviewed  CBC WITH DIFFERENTIAL/PLATELET  BASIC METABOLIC PANEL  MAGNESIUM  TROPONIN I (HIGH SENSITIVITY)   ____________________________________________  EKG  ED ECG REPORT I, Blake Divine, the attending physician, personally viewed and interpreted this ECG.   Date: 03/27/2021  EKG Time: 13:48  Rate: 78  Rhythm: normal sinus rhythm, occasional PVC  Axis: Normal  Intervals:none  ST&T Change: None  PROCEDURES  Procedure(s) performed (including Critical Care):  Procedures   ____________________________________________   INITIAL IMPRESSION / ASSESSMENT AND PLAN / ED COURSE      70 year old female with past medical history of GERD, IBS, bronchiectasis, and anxiety presents to the ED with increasing dizziness and lightheadedness since starting Lexapro 1 week ago.  Vital signs are reassuring, we will screen EKG and labs to ensure no change from yesterday.  She describes a burning discomfort in her chest but overall low suspicion for ACS given unremarkable work-up yesterday with unchanged symptoms.  It does seem likely that she has having some side effects from the Lexapro, if work-up is unremarkable we will have patient stop the Lexapro as withdrawal symptoms are unlikely given she has been taking the SSRI for less than 1 week.  Labs are unremarkable, troponin within normal limits.  Patient is appropriate for discharge home without repeat troponin given her constant discomfort since yesterday, doubt cardiac etiology for her symptoms.   Her symptoms do sound like side effect related to Lexapro and she was counseled to discontinue this medication.  She was counseled to follow-up with her PCP and to return to the ED for new worsening symptoms, patient agrees with plan.      ____________________________________________   FINAL CLINICAL IMPRESSION(S) / ED DIAGNOSES  Final diagnoses:  Dizziness  Chest pain, unspecified type     ED Discharge Orders     None        Note:  This document was prepared using Dragon voice recognition software and may include unintentional dictation errors.    Blake Divine, MD 03/27/21 [phone]

## 2021-03-27 NOTE — ED Triage Notes (Signed)
Pt to ED POV for dizziness that started this am. Pt states she has also been feeling agitated and confused. Seen yesterday for same.  Pt alert and oriented x4. Clear speech.   Pt denies dizziness at this time. States she is here for dry throat and agitation  Reports she thinks these sx are from starting lexapro a week ago  Pt declines blood work at this time

## 2021-03-28 ENCOUNTER — Telehealth: Payer: Self-pay

## 2021-03-28 NOTE — Telephone Encounter (Signed)
Please call pt for hosp. Follow up.  KP

## 2021-03-28 NOTE — Telephone Encounter (Signed)
Called pt she is feeling better since she has stopped taking lexapro. Told pt she does not need a hops. F/u to give [place] a call if she is getting worse. Pt verbalized understanding. Also told pt she does most likely does not need a referral for psychiatrist.   KP

## 2021-03-28 NOTE — Telephone Encounter (Unsigned)
Copied from [place] [phone]. Topic: Appointment Scheduling - Scheduling Inquiry for Clinic >> Mar 28, 2021  9:18 AM [place] wrote: Reason for CRM: Called office need a sooner than sept HFU for pt ER on 7/21,26,27 chest pain etc various symptoms, pt states concluded possible Lexapro, pt taken off Lexapro, Pt advised FU [phone] HFU appt  Pt prefers afternoon apt any, will accept any, pt also has lost her mother.

## 2021-03-29 ENCOUNTER — Telehealth: Payer: Self-pay | Admitting: Gastroenterology

## 2021-03-29 NOTE — Telephone Encounter (Signed)
Patient called and she is still having chest pains and vaginal pains. Was recently in the ED. What can she do?

## 2021-04-01 ENCOUNTER — Ambulatory Visit: Payer: Self-pay

## 2021-04-01 NOTE — Telephone Encounter (Signed)
Patient called, no answer, unable to leave VM due to mailbox is full. Will route to office due to 3 unsuccessful attempts by PEC NT per protocol.    Message from [place] sent at 04/01/2021  9:45 AM EDT  Pt called stating that she has been having stomach burning x 1 month. She states that she is also experiencing fatigue, and was diagnosed with cirrhosis of the liver at the ED, and throwing up blood x4-5 weeks. Please advise.

## 2021-04-01 NOTE — Telephone Encounter (Signed)
Message from [place] sent at 04/01/2021  9:45 AM EDT  Pt called stating that she has been having stomach burning x 1 month. She states that she is also experiencing fatigue, and was diagnosed with cirrhosis of the liver at the ED, and throwing up blood x4-5 weeks. Please advise.   Mailbox full

## 2021-04-01 NOTE — Telephone Encounter (Signed)
Pt is aware as instructed and expressed understanding 

## 2021-04-02 NOTE — Telephone Encounter (Signed)
Tried calling patient and unable to reach her. Mailbox full so could not leave VM. If she is vomiting blood, she needs to go to the ER.

## 2021-04-05 ENCOUNTER — Ambulatory Visit: Payer: Medicare Other | Admitting: Dietician

## 2021-04-11 DIAGNOSIS — K222 Esophageal obstruction: Secondary | ICD-10-CM | POA: Diagnosis not present

## 2021-04-11 DIAGNOSIS — T781XXA Other adverse food reactions, not elsewhere classified, initial encounter: Secondary | ICD-10-CM | POA: Insufficient documentation

## 2021-04-11 DIAGNOSIS — K2950 Unspecified chronic gastritis without bleeding: Secondary | ICD-10-CM | POA: Diagnosis not present

## 2021-04-11 DIAGNOSIS — J309 Allergic rhinitis, unspecified: Secondary | ICD-10-CM | POA: Insufficient documentation

## 2021-04-11 DIAGNOSIS — K449 Diaphragmatic hernia without obstruction or gangrene: Secondary | ICD-10-CM | POA: Diagnosis not present

## 2021-04-11 DIAGNOSIS — T7819XA Other adverse food reactions, not elsewhere classified, initial encounter: Secondary | ICD-10-CM | POA: Insufficient documentation

## 2021-04-12 ENCOUNTER — Other Ambulatory Visit: Payer: Self-pay

## 2021-04-12 ENCOUNTER — Ambulatory Visit
Admission: RE | Admit: 2021-04-12 | Discharge: 2021-04-12 | Disposition: A | Discharge status: Home / Self Care | Payer: Medicare Other | Source: Ambulatory Visit | Attending: Gastroenterology | Admitting: Gastroenterology

## 2021-04-12 DIAGNOSIS — K759 Inflammatory liver disease, unspecified: Secondary | ICD-10-CM | POA: Insufficient documentation

## 2021-04-12 DIAGNOSIS — R188 Other ascites: Secondary | ICD-10-CM | POA: Diagnosis not present

## 2021-04-16 ENCOUNTER — Other Ambulatory Visit: Payer: Self-pay

## 2021-04-17 ENCOUNTER — Other Ambulatory Visit: Payer: Self-pay

## 2021-04-17 ENCOUNTER — Encounter: Payer: Medicare Other | Attending: Gastroenterology | Admitting: Dietician

## 2021-04-17 ENCOUNTER — Encounter: Payer: Self-pay | Admitting: Dietician

## 2021-04-17 VITALS — Ht 68.0 in | Wt 112.5 lb

## 2021-04-17 DIAGNOSIS — R634 Abnormal weight loss: Secondary | ICD-10-CM | POA: Insufficient documentation

## 2021-04-17 DIAGNOSIS — Z681 Body mass index (BMI) 19 or less, adult: Secondary | ICD-10-CM | POA: Insufficient documentation

## 2021-04-17 DIAGNOSIS — R140 Abdominal distension (gaseous): Secondary | ICD-10-CM | POA: Insufficient documentation

## 2021-04-17 NOTE — Addendum Note (Signed)
Addended by: Vonda Antigua on: 04/17/2021 10:24 AM   Modules accepted: Orders

## 2021-04-17 NOTE — Progress Notes (Signed)
Medical Nutrition Therapy: Visit start time: [id number]  end time: 1125  Assessment:  Diagnosis: abnormal weight loss Medical history changes: mild cirrhosis/ hepatitis, hiatal hernia Psychosocial issues/ stress concerns: anxiety, high stress level  Current weight: 112.5lbs Height: 5'8" BMI: 17.11 Medications, supplement changes: reconciled list in medical record  Progress and evaluation:  Trying seed butters ie tahini and sunflower, had reaction to peanut butter which resolved after taking benadryl.  Reports 1 bowel movement daily since following healthy liver diet. Energy level is somewhat improved, along with abdominal pain.  Continues to struggle with weight gain. She typically checks weight after breakfast, most recent weight was 110.8. Weight at previous visit on 03/01/21 was 115.6lbs Currently not drinking protein shakes due to episodes of coughing and regurgitating a few minutes after consumption. She reports similar symptoms with dairy and sugary beverages. Has been tolerated coconut milk   Physical activity: limited  Dietary Intake:  Usual eating pattern includes 3 meals and 2 snacks per day. Dining out frequency: not assessed today  Breakfast: 1 egg or fish and buckwheat cakes  Snack:  Lunch: fish, buckwheat cakes; sunflower butter or tahini, fruit Snack:  Supper: fish, veg ie brussels sprouts, potato Snack: none Beverages: water, green tea, coconut milk  Nutrition Care Education: Topics covered:  Weight gain: reviewed progress since previous visit; appropriate frequency of meals/ snacks; inclusion of small amounts of healthy fats Liver and GI health: inclusion of protein sources and healthy options; small, frequent meals and snacks, strategies for increasing tolerated volume of food; low fat and low sugar options to increase caloric intake ie homemade smoothie with tolerated ingredients; snack options  Nutritional Diagnosis:  [place]-1.4 Altered GI function As related to liver  dysfunction, hiatal hernia, IBS, GERD.  As evidenced by patient reported symptoms and recent ultrasound results. [place]-3.1 Underweight As related to GI distress.  As evidenced by patient with current BMI of 17.  Intervention:  Instruction as noted above. Recent diet changes have resulted in some improvement in symptoms per patient; current issues still causing some adverse symptoms. Weight has decreased further.  Patient will plan to experiment with smoothie drinks she can make at home. Provided sample [place] protein powder  Lot#   Exp: 07/2021  Education Materials given:  Visit summary with goals/ instructions   Learner/ who was taught:  Patient   Level of understanding: Verbalizes/ demonstrates competency   Demonstrated degree of understanding via:   Teach back Learning barriers: None  Willingness to learn/ readiness for change: Eager, change in progress  Monitoring and Evaluation:  Dietary intake, exercise, GI symptoms and health, and body weight      follow up:  05/29/21 at 1:15pm

## 2021-04-17 NOTE — Patient Instructions (Signed)
Look for high calorie smoothie recipes to try. A good recipe would include a fruit and/or vegetable, a liquid such as coconut or soy milk, oats as a starch/ bulk, seed butter for some healthy fat and a protein source like protein powder, Greek yogurt, tofu.  Continue to eat frequently, add more snacks into the day if needed, even keeping a non-perishable snack like gluten free crackers or granola bar at bedside.

## 2021-04-19 ENCOUNTER — Encounter: Payer: Self-pay | Admitting: Gastroenterology

## 2021-05-03 ENCOUNTER — Ambulatory Visit (INDEPENDENT_AMBULATORY_CARE_PROVIDER_SITE_OTHER): Payer: Medicare Other | Admitting: Internal Medicine

## 2021-05-03 ENCOUNTER — Encounter: Payer: Self-pay | Admitting: Internal Medicine

## 2021-05-03 ENCOUNTER — Other Ambulatory Visit: Payer: Self-pay

## 2021-05-03 VITALS — BP 112/80 | HR 64 | Temp 98.3°F | Ht 68.0 in | Wt 112.0 lb

## 2021-05-03 DIAGNOSIS — Z23 Encounter for immunization: Secondary | ICD-10-CM

## 2021-05-03 DIAGNOSIS — F419 Anxiety disorder, unspecified: Secondary | ICD-10-CM

## 2021-05-03 DIAGNOSIS — K760 Fatty (change of) liver, not elsewhere classified: Secondary | ICD-10-CM | POA: Diagnosis not present

## 2021-05-03 DIAGNOSIS — E041 Nontoxic single thyroid nodule: Secondary | ICD-10-CM | POA: Diagnosis not present

## 2021-05-03 NOTE — Progress Notes (Signed)
Date:  05/03/2021   Name:  Audrey Hayden   DOB:  1951-05-01   MRN:  [id number]   Chief Complaint: Anxiety (Lexapro is not working, pt is not taking it for about 1 month, took it for 5 days, "amplified liver problems") and Flu Vaccine  Anxiety Presents for follow-up visit. Patient reports no chest pain, dizziness, nervous/anxious behavior, shortness of breath or suicidal ideas. Symptoms occur occasionally. The severity of symptoms is mild. The quality of sleep is good.   Compliance with medications: could not take lexapro due to "liver" problems. Side effects of treatment include GI discomfort (she is now taking lycine with some benefit).   Fatty Liver - seen on CT and [place].  She is trying to follow a liver diet - avoiding sugar and other inflamatory agents.  She is working with a dietician who is helping her.  She has seen Dr. Bonna Gains who recommended some additional labs.  She was not aware so has not had these done.  Thyroid nodule - this is followed by Endocrinology.  Patient is concerned that thyroid may still be playing a role in her inability to gain weight.  Review of her chart reveals a solitary 1.3 cm right thyroid nodule.  This was unchanged from June 2021.  In addition, TSH and T4 were within normal limits.  I do not believe she would benefit from a second opinion as this situation appears to be extremely straightforward. Lab Results  Component Value Date   CREATININE 0.76 03/27/2021   BUN 16 03/27/2021   NA 137 03/27/2021   K 3.9 03/27/2021   CL 98 03/27/2021   CO2 28 03/27/2021   Lab Results  Component Value Date   CHOL 189 03/20/2021   TRIG 60 03/20/2021   Lab Results  Component Value Date   TSH 2.130 11/19/2017   Lab Results  Component Value Date   HGBA1C 5.7 (H) 02/15/2021   Lab Results  Component Value Date   WBC 6.6 03/27/2021   HGB 13.8 03/27/2021   HCT 41.6 03/27/2021   MCV 92.4 03/27/2021   PLT 187 03/27/2021   Lab Results  Component Value  Date   ALT 37 03/26/2021   AST 34 03/26/2021   ALKPHOS 85 03/26/2021   BILITOT 1.4 (H) 03/26/2021     Review of Systems  Constitutional:  Positive for unexpected weight change. Negative for chills, fatigue and fever.  Respiratory:  Negative for chest tightness and shortness of breath.   Cardiovascular:  Negative for chest pain and leg swelling.  Gastrointestinal:  Positive for abdominal distention. Negative for constipation, diarrhea and vomiting.  Musculoskeletal:  Negative for arthralgias.  Skin:  Negative for rash.  Neurological:  Negative for dizziness and headaches.  Psychiatric/Behavioral:  Negative for dysphoric mood and suicidal ideas. The patient is not nervous/anxious.    Patient Active Problem List   Diagnosis Date Noted   Esophageal dysphagia    Schatzki's ring    Gastric erythema    Bronchiectasis ([place]) 08/10/2020   Asymptomatic spider veins of both lower extremities 02/25/2019   Chronic left-sided thoracic back pain 12/09/2017   Anxiety 12/09/2017   Environmental and seasonal allergies 06/19/2015   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/19/2015   Osteoporosis 06/19/2015   Abnormal Pap smear of cervix 06/19/2015   Elevated HDL 06/19/2015   History of gastritis 11/08/2014   Cough, persistent 08/14/2014    Allergies  Allergen Reactions   Eggs Or Egg-Derived Products Itching and Rash  Amoxicillin Other (See Comments)    Tingling around the mouth   Azelastine Hcl Other (See Comments)   Cefdinir     Other reaction(s): Other (See Comments) Bright red face and ankle swelling: possible reaction to the medicine.    Clarithromycin Other (See Comments)    Causes her to faint.   Codeine Other (See Comments)    Causes her GI upset.   Hydromorphone Hcl Other (See Comments)    Unsure of reaction   Hydromorphone Hcl Other (See Comments)   Ibuprofen Other (See Comments)   Meperidine Other (See Comments)    Unsure of reaction   Montelukast Other (See Comments)    Moxifloxacin Other (See Comments)    Dizziness    Nsaids     Other reaction(s): Unknown   Omeprazole Other (See Comments)    Ears ringing with more than '20mg'$ /day   Other Other (See Comments)    All profins cause her to faint. All the mycins cause her to faint.   Prednisone Other (See Comments)    Bright red face and ankle swelling: possible reaction to the medicine.   Pseudoephedrine Other (See Comments)   Pseudoephedrine Hcl     Other reaction(s): Syncope   Ranitidine     Other reaction(s): Other (See Comments) SE noted with high dosage Dk urine and stools, HA   Salicylates Hives   Aspirin Hives and Rash   Dexilant [Dexlansoprazole] Rash    Peeling skin   Doxycycline Other (See Comments)    Headache. "Pressure on the brain''   Montelukast Sodium Palpitations    Past Surgical History:  Procedure Laterality Date   BREAST BIOPSY Left    neg   COLONOSCOPY  2004   ESOPHAGOGASTRODUODENOSCOPY (EGD) WITH PROPOFOL N/A 12/05/2020   Procedure: ESOPHAGOGASTRODUODENOSCOPY (EGD) WITH PROPOFOL;  Surgeon: Virgel Manifold, MD;  Location: [facility] ENDOSCOPY;  Service: Endoscopy;  Laterality: N/A;   TONSILLECTOMY     UPPER GI ENDOSCOPY  12/2018    Social History   Tobacco Use   Smoking status: Former    Packs/day: 1.50    Years: 5.00    Pack years: 7.50    Types: Cigarettes    Start date: 41    Quit date: 04/02/1975    Years since quitting: 46.1   Smokeless tobacco: Never   Tobacco comments:    smoking cessation materials not required  Vaping Use   Vaping Use: Never used  Substance Use Topics   Alcohol use: No    Alcohol/week: 0.0 standard drinks   Drug use: No     Medication list has been reviewed and updated.  Current Meds  Medication Sig   Calcium-Phosphorus-Vitamin D (CITRACAL +D3 PO) Take 2 tablets by mouth 2 (two) times daily.   cetirizine (ZYRTEC) 10 MG tablet Take 10 mg by mouth daily.   escitalopram (LEXAPRO) 10 MG tablet Take 1 tablet (10 mg total) by mouth  daily.   L-Lysine 1000 MG TABS Take by mouth.   Magnesium 250 MG TABS Take 1 tablet by mouth in the morning and at bedtime.   Misc Natural Products (ELDERBERRY ZINC/VIT C/IMMUNE MT) Use as directed in the mouth or throat. syrup   Multiple Vitamins-Minerals (ALIVE WOMENS 50+ PO)    Probiotic Product (PROBIOTIC BLEND PO)    Quercetin 250 MG TABS Take 500 mg by mouth at bedtime.   [DISCONTINUED] EPINEPHrine 0.3 mg/0.3 mL IJ SOAJ injection See admin instructions.    PHQ 2/9 Scores 05/03/2021 03/07/2021 03/01/2021 02/15/2021  PHQ -  2 Score 0 0 1 0  PHQ- 9 Score 0 1 - 3    GAD 7 : Generalized Anxiety Score 05/03/2021 03/07/2021 02/15/2021 10/26/2020  Nervous, Anxious, on Edge 0 '1 1 1  '$ Control/stop worrying 0 1 1 0  Worry too much - different things 0 0 1 0  Trouble relaxing 0 1 1 0  Restless 0 0 1 0  Easily annoyed or irritable 0 0 1 0  Afraid - awful might happen 0 1 1 0  Total GAD 7 Score 0 '4 7 1  '$ Anxiety Difficulty - Somewhat difficult - -    BP Readings from Last 3 Encounters:  05/03/21 112/80  03/27/21 136/69  03/26/21 130/68    Physical Exam Vitals and nursing note reviewed.  Constitutional:      General: She is not in acute distress.    Appearance: She is well-developed.  HENT:     Head: Normocephalic and atraumatic.  Pulmonary:     Effort: Pulmonary effort is normal. No respiratory distress.  Skin:    General: Skin is warm and dry.     Findings: No rash.  Neurological:     Mental Status: She is alert and oriented to person, place, and time.  Psychiatric:        Mood and Affect: Mood normal.        Behavior: Behavior normal.    Wt Readings from Last 3 Encounters:  05/03/21 112 lb (50.8 kg)  04/17/21 112 lb 8 oz (51 kg)  03/27/21 110 lb 3.7 oz (50 kg)    BP 112/80   Pulse 64   Temp 98.3 F (36.8 C) (Oral)   Ht '5\' 8"'$  (1.727 m)   Wt 112 lb (50.8 kg)   SpO2 99%   BMI 17.03 kg/m   Assessment and Plan: 1. Anxiety Doing well currently off of Lexapro. Continue  lysine daily.  2. Fatty liver This was noted on both ultrasound and CT scans.  She will continue to work with the dietitian for a "liver "diet. She will follow-up with gastroenterology regarding the additional labs that have been requested.  3. Thyroid nodule Recent ultrasound and labs were reviewed with the patient. All the findings are completely benign as well as stable. Agree with continued annual follow-up ultrasound and thyroid function tests.  No compelling reason to pursue a second opinion.   Partially dictated using Editor, commissioning. Any errors are unintentional.  Halina Maidens, MD [facility]  05/03/2021

## 2021-05-08 DIAGNOSIS — K759 Inflammatory liver disease, unspecified: Secondary | ICD-10-CM | POA: Diagnosis not present

## 2021-05-15 ENCOUNTER — Encounter: Payer: Self-pay | Admitting: Gastroenterology

## 2021-05-15 ENCOUNTER — Ambulatory Visit (INDEPENDENT_AMBULATORY_CARE_PROVIDER_SITE_OTHER): Payer: Medicare Other | Admitting: Gastroenterology

## 2021-05-15 ENCOUNTER — Other Ambulatory Visit: Payer: Self-pay

## 2021-05-15 VITALS — BP 139/79 | HR 65 | Temp 98.3°F | Ht 68.0 in | Wt 111.4 lb

## 2021-05-15 DIAGNOSIS — Z23 Encounter for immunization: Secondary | ICD-10-CM

## 2021-05-15 DIAGNOSIS — R140 Abdominal distension (gaseous): Secondary | ICD-10-CM

## 2021-05-15 DIAGNOSIS — R195 Other fecal abnormalities: Secondary | ICD-10-CM | POA: Diagnosis not present

## 2021-05-15 NOTE — Progress Notes (Signed)
Audrey Antigua, MD [address]  [place]  [place], [place] [address]  Main: [phone]  Fax: [phone]   Primary Care Physician: Glean Hess, MD   Chief Complaint  Patient presents with   Follow-up    Pt reports she found a diet for people who have liver condition and states she no longer has the burning feeling or pain in abd... Still has abd swelling after eating and a cough   Weight Loss    Pt is still concerned as she is still not gaining weight     HPI: Audrey Hayden is a 70 y.o. female here for follow-up.  States abdominal pain is much better.  She continues to google her symptoms and is questioning if she can get testing for SIBO.  States she looked up [facility] and her symptoms are consistent with SIBO.  She describes having 1 loose bowel movement today and abdominal bloating.  No nausea or vomiting.  Is working with a nutritionist to help gain weight.  Has not had a colonoscopy.   ROS: All ROS reviewed and negative except as per HPI   Past Medical History:  Diagnosis Date   Allergy    Bronchiectasis ([place])    GERD (gastroesophageal reflux disease)    Gross hematuria 02/25/2019   IBS (irritable bowel syndrome)    Osteoporosis     Past Surgical History:  Procedure Laterality Date   BREAST BIOPSY Left    neg   COLONOSCOPY  2004   ESOPHAGOGASTRODUODENOSCOPY (EGD) WITH PROPOFOL N/A 12/05/2020   Procedure: ESOPHAGOGASTRODUODENOSCOPY (EGD) WITH PROPOFOL;  Surgeon: Virgel Manifold, MD;  Location: [facility] ENDOSCOPY;  Service: Endoscopy;  Laterality: N/A;   TONSILLECTOMY     UPPER GI ENDOSCOPY  12/2018    Prior to Admission medications   Medication Sig Start Date End Date Taking? Authorizing Provider  Calcium-Phosphorus-Vitamin D (CITRACAL +D3 PO) Take 2 tablets by mouth 2 (two) times daily.   Yes [provider]  cetirizine (ZYRTEC) 10 MG tablet Take 10 mg by mouth daily.   Yes [provider]  L-Lysine 1000 MG TABS Take  by mouth.   Yes [provider]  Magnesium 250 MG TABS Take 1 tablet by mouth in the morning and at bedtime.   Yes [provider]  Misc Natural Products (ELDERBERRY ZINC/VIT C/IMMUNE MT) Use as directed in the mouth or throat. syrup   Yes [provider]  Multiple Vitamins-Minerals (ALIVE WOMENS 50+ PO)    Yes [provider]  Probiotic Product (PROBIOTIC BLEND PO)    Yes [provider]  Quercetin 250 MG TABS Take 500 mg by mouth at bedtime.   Yes [provider]    Family History  Problem Relation Age of Onset   Hypertension Mother    CAD Father    Heart attack Father    Breast cancer Neg Hx    Colon cancer Neg Hx      Social History   Tobacco Use   Smoking status: Former    Packs/day: 1.50    Years: 5.00    Pack years: 7.50    Types: Cigarettes    Start date: 7    Quit date: 04/02/1975    Years since quitting: 46.1   Smokeless tobacco: Never   Tobacco comments:    smoking cessation materials not required  Vaping Use   Vaping Use: Never used  Substance Use Topics   Alcohol use: No    Alcohol/week:  0.0 standard drinks   Drug use: No    Allergies as of 05/15/2021 - Review Complete 05/15/2021  Allergen Reaction Noted   Eggs or egg-derived products Itching and Rash 07/21/2019   Amoxicillin Other (See Comments) 09/18/2014   Azelastine hcl Other (See Comments) 01/18/2021   Cefdinir  09/18/2014   Clarithromycin Other (See Comments) 09/18/2014   Codeine Other (See Comments) 09/18/2014   Hydromorphone hcl Other (See Comments) 09/18/2014   Hydromorphone hcl Other (See Comments) 06/10/2020   Ibuprofen Other (See Comments) 01/18/2021   Meperidine Other (See Comments) 09/18/2014   Montelukast Other (See Comments) 01/18/2021   Moxifloxacin Other (See Comments) 11/14/2015   Nsaids  09/18/2014   Omeprazole Other (See Comments) 06/19/2015   Other Other (See Comments) 09/18/2014   Prednisone Other (See Comments)  09/18/2014   Pseudoephedrine Other (See Comments) 01/18/2021   Pseudoephedrine hcl  09/18/2014   Ranitidine  123XX123   Salicylates Hives 123XX123   Aspirin Hives and Rash 09/18/2014   Dexilant [dexlansoprazole] Rash 09/12/2019   Doxycycline Other (See Comments) 09/14/2017   Montelukast sodium Palpitations 09/18/2014    Physical Examination:  Constitutional: General:   Alert,  Well-developed, well-nourished, pleasant and cooperative in NAD BP 139/79   Pulse 65   Temp 98.3 F (36.8 C) (Oral)   Ht '5\' 8"'$  (1.727 m)   Wt 111 lb 6.4 oz (50.5 kg)   BMI 16.94 kg/m   Respiratory: Normal respiratory effort  Gastrointestinal:  Soft, non-tender and non-distended without masses, hepatosplenomegaly or hernias noted.  No guarding or rebound tenderness.     Cardiac: No clubbing or edema.  No cyanosis. Normal posterior tibial pedal pulses noted.  Psych:  Alert and cooperative. Normal mood and affect.  Musculoskeletal:  Normal gait. Head normocephalic, atraumatic. Symmetrical without gross deformities. 5/5 Lower extremity strength bilaterally.  Skin: Warm. Intact without significant lesions or rashes. No jaundice.  Neck: Supple, trachea midline  Lymph: No cervical lymphadenopathy  Psych:  Alert and oriented x3, Alert and cooperative. Normal mood and affect.  Labs: CMP     Component Value Date/Time   NA 137 03/27/2021 1556   NA 137 02/25/2019 1410   NA 138 12/05/2013 0800   K 3.9 03/27/2021 1556   K 3.6 12/05/2013 0800   CL 98 03/27/2021 1556   CL 105 12/05/2013 0800   CO2 28 03/27/2021 1556   CO2 28 12/05/2013 0800   GLUCOSE 94 03/27/2021 1556   GLUCOSE 111 (H) 12/05/2013 0800   BUN 16 03/27/2021 1556   BUN 18 02/25/2019 1410   BUN 9 12/05/2013 0800   CREATININE 0.76 03/27/2021 1556   CREATININE 0.82 12/05/2013 0800   CALCIUM 9.4 03/27/2021 1556   CALCIUM 8.5 12/05/2013 0800   PROT 7.1 03/26/2021 1406   PROT 6.9 02/25/2019 1410   PROT 6.6 12/05/2013 0800   ALBUMIN  4.1 03/26/2021 1406   ALBUMIN 4.5 02/25/2019 1410   ALBUMIN 3.4 12/05/2013 0800   AST 34 03/26/2021 1406   AST 29 12/05/2013 0800   ALT 37 03/26/2021 1406   ALT 27 12/05/2013 0800   ALKPHOS 85 03/26/2021 1406   ALKPHOS 66 12/05/2013 0800   BILITOT 1.4 (H) 03/26/2021 1406   BILITOT 0.5 02/25/2019 1410   BILITOT 1.4 (H) 12/05/2013 0800   GFRNONAA >60 03/27/2021 1556   GFRNONAA >60 12/05/2013 0800   GFRAA 72 02/25/2019 1410   GFRAA >60 12/05/2013 0800   Lab Results  Component Value Date   WBC 6.6 03/27/2021   HGB 13.8 03/27/2021  HCT 41.6 03/27/2021   MCV 92.4 03/27/2021   PLT 187 03/27/2021    Imaging Studies:   Assessment and Plan:   Audrey Hayden is a 70 y.o. y/o female here for follow-up, and reports improvement in abdominal pain, and is reporting 1 loose bowel movement today  We discussed that patient does not have risk factors for SIBO, but given her bloating and loose bowel movements and her concerns over read, SIBO testing can be done.  Will order.  Her fiber sure level did not show fibrosis, or steatosis  However, since her ultrasound did suggest fatty liver, I have recommended hepatitis a and B vaccine to the patient  Continue to work with nutritionist for weight gain  Continue to follow with Dr. Army Melia for anxiety as I do think this is playing into her GI complaints and symptoms  We also discussed that if she continues to have symptoms, colonoscopy again can be considered.  She is not interested in this at this time.  Dr Audrey Hayden

## 2021-05-15 NOTE — Patient Instructions (Signed)
You will need to come back for 2nd Hep A/Hep B Vaccine after 06/14/2021 and for the 3rd after 11/12/2021

## 2021-05-17 LAB — MITOCHONDRIAL/SMOOTH MUSCLE AB PNL
Mitochondrial Ab: <20 Units (ref 0.0–20.0)
Smooth Muscle Ab: 13 Units (ref 0–19)

## 2021-05-17 LAB — HEPATITIS C ANTIBODY: Hep C Virus Ab: <0.1 s/co ratio (ref 0.0–0.9)

## 2021-05-17 LAB — IRON AND TIBC
Iron Saturation: 21 % (ref 15–55)
Iron: 70 ug/dL (ref 27–139)
Total Iron Binding Capacity: 329 ug/dL (ref 250–450)
UIBC: 259 ug/dL (ref 118–369)

## 2021-05-17 LAB — ANA: ANA Titer 1: NEGATIVE

## 2021-05-17 LAB — HEPATITIS A ANTIBODY, TOTAL: hep A Total Ab: NEGATIVE

## 2021-05-17 LAB — CERULOPLASMIN: Ceruloplasmin: 29.8 mg/dL (ref 19.0–39.0)

## 2021-05-17 LAB — FERRITIN: Ferritin: 90 ng/mL (ref 15–150)

## 2021-05-17 LAB — HEPATITIS B CORE ANTIBODY, TOTAL: Hep B Core Total Ab: NEGATIVE

## 2021-05-17 LAB — IGG: IgG (Immunoglobin G), Serum: 950 mg/dL (ref 586–1602)

## 2021-05-17 LAB — HEPATITIS B SURFACE ANTIGEN: Hepatitis B Surface Ag: NEGATIVE

## 2021-05-17 LAB — ANTI-MICROSOMAL ANTIBODY LIVER / KIDNEY: LKM1 Ab: 1.1 Units (ref 0.0–20.0)

## 2021-05-17 LAB — HEPATITIS B SURFACE ANTIBODY,QUALITATIVE: Hep B Surface Ab, Qual: NONREACTIVE

## 2021-05-22 ENCOUNTER — Ambulatory Visit: Payer: Medicare Other | Admitting: Gastroenterology

## 2021-05-24 ENCOUNTER — Telehealth: Payer: Self-pay | Admitting: Gastroenterology

## 2021-05-24 ENCOUNTER — Other Ambulatory Visit: Payer: Self-pay

## 2021-05-24 ENCOUNTER — Telehealth: Payer: Self-pay

## 2021-05-24 DIAGNOSIS — F419 Anxiety disorder, unspecified: Secondary | ICD-10-CM

## 2021-05-24 NOTE — Telephone Encounter (Signed)
Pt. Calling to see if she can make an appt. For her 2nd hep shot. She says it has to be after 10/14.

## 2021-05-24 NOTE — Telephone Encounter (Signed)
Placed referral for Dr Nicolasa Ducking.

## 2021-05-24 NOTE — Telephone Encounter (Signed)
Copied from [place] [id number]. Topic: Referral - Request for Referral >> May 24, 2021 11:23 AM Lennox Solders wrote: Has patient seen PCP for this complaint? Yes.  About a year ago *If NO, is insurance requiring patient see PCP for this issue before PCP can refer them? Pt has medicare Referral for which specialty: Psychiatry Preferred provider/office: dr Cephus Shelling phone number [phone] Reason for referral: Pt is having marital problems and dealing with health issue her liver and would like to talk with dr Nicolasa Ducking

## 2021-05-28 ENCOUNTER — Ambulatory Visit: Payer: Self-pay | Admitting: *Deleted

## 2021-05-28 ENCOUNTER — Ambulatory Visit (INDEPENDENT_AMBULATORY_CARE_PROVIDER_SITE_OTHER): Payer: Medicare Other | Admitting: Internal Medicine

## 2021-05-28 ENCOUNTER — Ambulatory Visit: Payer: Self-pay

## 2021-05-28 ENCOUNTER — Encounter: Payer: Self-pay | Admitting: Internal Medicine

## 2021-05-28 ENCOUNTER — Telehealth: Payer: Self-pay | Admitting: Gastroenterology

## 2021-05-28 ENCOUNTER — Other Ambulatory Visit: Payer: Self-pay

## 2021-05-28 VITALS — BP 100/62 | HR 76 | Ht 68.0 in | Wt 111.2 lb

## 2021-05-28 DIAGNOSIS — S0081XA Abrasion of other part of head, initial encounter: Secondary | ICD-10-CM | POA: Diagnosis not present

## 2021-05-28 DIAGNOSIS — R140 Abdominal distension (gaseous): Secondary | ICD-10-CM | POA: Diagnosis not present

## 2021-05-28 DIAGNOSIS — M7122 Synovial cyst of popliteal space [Baker], left knee: Secondary | ICD-10-CM | POA: Diagnosis not present

## 2021-05-28 DIAGNOSIS — R7989 Other specified abnormal findings of blood chemistry: Secondary | ICD-10-CM | POA: Diagnosis not present

## 2021-05-28 NOTE — Telephone Encounter (Signed)
Pt fell this am. Has "rug burn" on face. Pt hung up prior to transfer. Called pt and unable to LM because VM is full.

## 2021-05-28 NOTE — Telephone Encounter (Signed)
Have attempted to call pt 3 times and husband once. Pt VM full. LM on spouses VM to call office number. Routing to Provider. Called PCP office and spoke to [place] and provided MRN and reason for call.

## 2021-05-28 NOTE — Telephone Encounter (Signed)
Pt reports went to [facility] during night, "Got dizzy and passed out before reaching bed." States fell, left side of face near eye with "Rug burn" no other injuries, no pain. Reports "Not sure how long I was out, but able to crawl back to bed eventually." Reports feels like BS was low. States frustration, has seen GI, is eating every 2 hours but can tell "Blood sugar drops if I exert myself and do not eat." States told by GI "My stomach empties out quickly." This AM,hours after episode, states BS after eating breakfast 120. Unintentional weight loss of 20 # since May. Pt appt with Dr. Army Melia scheduled this afternoon by agent prior to triage. Advised to keep appt, if dizziness occurs again check BS while symptoms present, if feels may pass out call EMS, go to ED. Pt verbalizes understanding.       Reason for Disposition  [1] MODERATE weakness (i.e., interferes with work, school, normal activities) AND [2] new-onset or worsening  Answer Assessment - Initial Assessment Questions 1. MECHANISM: "How did the fall happen?"     Dizzy, passed out. 2. DOMESTIC VIOLENCE AND ELDER ABUSE SCREENING: "Did you fall because someone pushed you or tried to hurt you?" If Yes, ask: "Are you safe now?"     no 3. ONSET: "When did the fall happen?" (e.g., minutes, hours, or days ago)     This AM 4. LOCATION: "What part of the body hit the ground?" (e.g., back, buttocks, head, hips, knees, hands, head, stomach)     Fell on left side, "Rug burn" left side of face, by eye 5. INJURY: "Did you hurt (injure) yourself when you fell?" If Yes, ask: "What did you injure? Tell me more about this?" (e.g., body area; type of injury; pain severity)"     "Rug burn" 6. PAIN: "Is there any pain?" If Yes, ask: "How bad is the pain?" (e.g., Scale 1-10; or mild,  moderate, severe)   - NONE (0): No pain   - MILD (1-3): Doesn't interfere with normal activities    - MODERATE (4-7): Interferes with normal activities or awakens from sleep    -  SEVERE (8-10): Excruciating pain, unable to do any normal activities      No 7. SIZE: For cuts, bruises, or swelling, ask: "How large is it?" (e.g., inches or centimeters)      Quarter size, under eye, cheekbone achy.  9. OTHER SYMPTOMS: "Do you have any other symptoms?" (e.g., dizziness, fever, weakness; new onset or worsening).      Dizziness, lt leg weak, not presently 10. CAUSE: "What do you think caused the fall (or falling)?" (e.g., tripped, dizzy spell)      "BS dropped."  Protocols used: Falls and [facility]

## 2021-05-28 NOTE — Telephone Encounter (Signed)
Pt. Calling for a SIBO test and to see when she can have her hep shot. Requesting call

## 2021-05-28 NOTE — Telephone Encounter (Signed)
Attempted to call pt back but VM is full.

## 2021-05-28 NOTE — Progress Notes (Signed)
Date:  05/28/2021   Name:  Audrey Hayden   DOB:  Jun 15, 1951   MRN:  [id number]   Chief Complaint: [place] at 5am this morning. In her home hallway between the bathroom and the bed. Hit her left eye and cheek on the carpeted floor. She now has rug burn around left eye. Behind left knee has soreness but can walk fine.) and Weight Loss (Patient working with nutritionist. Patient has a good appetite. Eats every 2 hours but vomiting because she is eating so much. Patients husband came to discuss weight - Patients husbands name is Gwyndolyn Saxon. )  Abdominal Pain This is a chronic problem. The problem occurs daily. The pain is mild. The quality of the pain is a sensation of fullness. Associated symptoms include belching, diarrhea and vomiting (mostly phlegm). Pertinent negatives include no anorexia (actually very hungry), fever, flatus or headaches. Relieved by: diet change to a 'liver diet'   Lab Results  Component Value Date   CREATININE 0.76 03/27/2021   BUN 16 03/27/2021   NA 137 03/27/2021   K 3.9 03/27/2021   CL 98 03/27/2021   CO2 28 03/27/2021   Lab Results  Component Value Date   CHOL 189 03/20/2021   TRIG 60 03/20/2021   Lab Results  Component Value Date   TSH 2.130 11/19/2017   Lab Results  Component Value Date   HGBA1C 5.7 (H) 02/15/2021   Lab Results  Component Value Date   WBC 6.6 03/27/2021   HGB 13.8 03/27/2021   HCT 41.6 03/27/2021   MCV 92.4 03/27/2021   PLT 187 03/27/2021   Lab Results  Component Value Date   ALT 37 03/26/2021   AST 34 03/26/2021   ALKPHOS 85 03/26/2021   BILITOT 1.4 (H) 03/26/2021     Review of Systems  Constitutional:  Negative for appetite change, fatigue, fever and unexpected weight change.  Respiratory:  Negative for choking and shortness of breath.   Cardiovascular:  Negative for chest pain and leg swelling.  Gastrointestinal:  Positive for abdominal pain, diarrhea and vomiting (mostly phlegm). Negative for anorexia  (actually very hungry) and flatus.  Skin:  Positive for wound (abrasion to left side of face).  Neurological:  Positive for syncope (fell early this am - may have passed out). Negative for dizziness and headaches.  Psychiatric/Behavioral:  Negative for sleep disturbance. The patient is nervous/anxious.    Patient Active Problem List   Diagnosis Date Noted   Thyroid nodule 05/03/2021   Esophageal dysphagia    Schatzki's ring    Gastric erythema    Bronchiectasis ([place]) 08/10/2020   Asymptomatic spider veins of both lower extremities 02/25/2019   Chronic left-sided thoracic back pain 12/09/2017   Anxiety 12/09/2017   Environmental and seasonal allergies 06/19/2015   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/19/2015   Osteoporosis 06/19/2015   Abnormal Pap smear of cervix 06/19/2015   Elevated HDL 06/19/2015   History of gastritis 11/08/2014   Cough, persistent 08/14/2014    Allergies  Allergen Reactions   Eggs Or Egg-Derived Products Itching and Rash   Amoxicillin Other (See Comments)    Tingling around the mouth   Azelastine Hcl Other (See Comments)   Cefdinir     Other reaction(s): Other (See Comments) Bright red face and ankle swelling: possible reaction to the medicine.    Clarithromycin Other (See Comments)    Causes her to faint.   Codeine Other (See Comments)    Causes her GI  upset.   Hydromorphone Hcl Other (See Comments)    Unsure of reaction   Hydromorphone Hcl Other (See Comments)   Ibuprofen Other (See Comments)   Meperidine Other (See Comments)    Unsure of reaction   Montelukast Other (See Comments)   Moxifloxacin Other (See Comments)    Dizziness    Nsaids     Other reaction(s): Unknown   Omeprazole Other (See Comments)    Ears ringing with more than 20mg /day   Other Other (See Comments)    All profins cause her to faint. All the mycins cause her to faint.   Prednisone Other (See Comments)    Bright red face and ankle swelling: possible reaction to  the medicine.   Pseudoephedrine Other (See Comments)   Pseudoephedrine Hcl     Other reaction(s): Syncope   Ranitidine     Other reaction(s): Other (See Comments) SE noted with high dosage Dk urine and stools, HA   Salicylates Hives   Aspirin Hives and Rash   Dexilant [Dexlansoprazole] Rash    Peeling skin   Doxycycline Other (See Comments)    Headache. "Pressure on the brain''   Montelukast Sodium Palpitations    Past Surgical History:  Procedure Laterality Date   BREAST BIOPSY Left    neg   COLONOSCOPY  2004   ESOPHAGOGASTRODUODENOSCOPY (EGD) WITH PROPOFOL N/A 12/05/2020   Procedure: ESOPHAGOGASTRODUODENOSCOPY (EGD) WITH PROPOFOL;  Surgeon: Virgel Manifold, MD;  Location: [facility] ENDOSCOPY;  Service: Endoscopy;  Laterality: N/A;   TONSILLECTOMY     UPPER GI ENDOSCOPY  12/2018    Social History   Tobacco Use   Smoking status: Former    Packs/day: 1.50    Years: 5.00    Pack years: 7.50    Types: Cigarettes    Start date: 62    Quit date: 04/02/1975    Years since quitting: 46.1   Smokeless tobacco: Never   Tobacco comments:    smoking cessation materials not required  Vaping Use   Vaping Use: Never used  Substance Use Topics   Alcohol use: No    Alcohol/week: 0.0 standard drinks   Drug use: No     Medication list has been reviewed and updated.  Current Meds  Medication Sig   Calcium-Phosphorus-Vitamin D (CITRACAL +D3 PO) Take 2 tablets by mouth 2 (two) times daily.   cetirizine (ZYRTEC) 10 MG tablet Take 10 mg by mouth daily.   L-Lysine 1000 MG TABS Take by mouth.   Magnesium 250 MG TABS Take 1 tablet by mouth in the morning and at bedtime.   Misc Natural Products (ELDERBERRY ZINC/VIT C/IMMUNE MT) Use as directed in the mouth or throat. syrup   Multiple Vitamins-Minerals (ALIVE WOMENS 50+ PO)    Probiotic Product (PROBIOTIC BLEND PO)    Quercetin 250 MG TABS Take 500 mg by mouth at bedtime.    PHQ 2/9 Scores 05/28/2021 05/03/2021 03/07/2021 03/01/2021  PHQ  - 2 Score 0 0 0 1  PHQ- 9 Score 4 0 1 -    GAD 7 : Generalized Anxiety Score 05/28/2021 05/03/2021 03/07/2021 02/15/2021  Nervous, Anxious, on Edge 1 0 1 1  Control/stop worrying 0 0 1 1  Worry too much - different things 1 0 0 1  Trouble relaxing 1 0 1 1  Restless 0 0 0 1  Easily annoyed or irritable 0 0 0 1  Afraid - awful might happen 1 0 1 1  Total GAD 7 Score 4 0 4 7  Anxiety Difficulty Not difficult at all - Somewhat difficult -    BP Readings from Last 3 Encounters:  05/28/21 100/62  05/15/21 139/79  05/03/21 112/80    Physical Exam Vitals and nursing note reviewed.  Constitutional:      General: She is not in acute distress.    Appearance: She is well-developed.  HENT:     Head: Normocephalic and atraumatic.  Cardiovascular:     Rate and Rhythm: Normal rate and regular rhythm.     Pulses: Normal pulses.  Pulmonary:     Effort: Pulmonary effort is normal. No respiratory distress.  Musculoskeletal:        General: Swelling (behind left knee -) present.     Cervical back: Normal range of motion.     Right lower leg: No edema.     Left lower leg: No edema.  Skin:    General: Skin is warm and dry.     Findings: Abrasion (shallow abrasion beside her left eye - no weeping or bleeding) present. No rash.  Neurological:     General: No focal deficit present.     Mental Status: She is alert and oriented to person, place, and time.  Psychiatric:        Mood and Affect: Mood normal.        Behavior: Behavior normal.    Wt Readings from Last 3 Encounters:  05/28/21 111 lb 3.2 oz (50.4 kg)  05/15/21 111 lb 6.4 oz (50.5 kg)  05/03/21 112 lb (50.8 kg)    BP 100/62   Pulse 76   Ht 5\' 8"  (1.727 m)   Wt 111 lb 3.2 oz (50.4 kg)   SpO2 96%   BMI 16.91 kg/m   Assessment and Plan: 1. Abdominal bloating I have encouraged her to proceed with colonoscopy with Dr. Darene Lamer Will get one additional test for completeness Encouraged her to continue to eat regular meals and not be put  off if she has some regurgitation after eating - Bowel Disorders [place]  2. Baker's cyst of knee, left Currently non-tender No specific treatment is needed  3. Abrasion of face, initial encounter Local care with TAO or Vaseline PRN until healed   Partially dictated using Editor, commissioning. Any errors are unintentional.  Halina Maidens, MD [facility]  05/28/2021

## 2021-05-29 ENCOUNTER — Encounter: Payer: Medicare Other | Attending: Gastroenterology | Admitting: Dietician

## 2021-05-29 VITALS — Ht 68.0 in | Wt 113.1 lb

## 2021-05-29 DIAGNOSIS — R634 Abnormal weight loss: Secondary | ICD-10-CM

## 2021-05-29 DIAGNOSIS — Z681 Body mass index (BMI) 19 or less, adult: Secondary | ICD-10-CM | POA: Insufficient documentation

## 2021-05-29 LAB — BOWEL DISORDERS CASCADE: tTG/DGP Screen: POSITIVE — AB

## 2021-05-29 LAB — NOTE:

## 2021-05-29 NOTE — Telephone Encounter (Signed)
Pt has been scheduled for 2nd hep a/b vaccine and is aware that I will touch base with them about testing, have refaxed order.Marland KitchenMarland Kitchen

## 2021-05-29 NOTE — Progress Notes (Signed)
Medical Nutrition Therapy: Visit start time: 1330  end time: 2979  Assessment:  Diagnosis: abnormal weight loss Medical history changes: no changes Psychosocial issues/ stress concerns: anxiety, high stress level  Current weight: 113.1lbs with sweater jacket  Height: 5'8" BMI: 17.2 Medications, supplement changes: no changes  Progress and evaluation:  Patient reports ongoing frequent hunger, eats every 2-3 hours during the day and arises several times during the night to eat.  She has occasional coughing and reflux of phlegm, but has improved.  She reports inflammation in GI tract; will be having colonoscopy soon to investigate any other issues.  Nuts cause GI distress; able to eat sunflower butter; avoids skins and peels on veg and fruits. States she ate mostly pureed foods / baby food for a time and inflammation/ GI symptoms improved. She does not feel she can go back to baby foods again. Patient is avoiding all dairy products and ingredients, as well as gluten.   Physical activity: some walking, limited by abdominal pain  Dietary Intake:  Usual eating pattern includes 3 meals and 3-4 snacks per day. Dining out frequency: ? meals per week.  Breakfast: egg or fish (eats first) + buckwheat cakes Snack: same as pm Lunch: hawaiian rolls (started to feel bad after eating them) Snack: apple and cashew/ seed butter Supper: fish + sweet potatoes avoid pasta due to gluten, dairy + veg ie brussels sprouts, cooked spinach Snack: same as pm Beverages: water  Nutrition Care Education: Topics covered:     Weight gain: reviewed progress since previous visit, options for tolerable foods GI Health: low fiber diet and soft foods, low fat foods to promote any necessary healing   Nutritional Diagnosis:  [place]-3.1 Underweight As related to GI distress.  As evidenced by patient with current BMI of 17.  Intervention:  Instruction and discussion as noted above. Updated nutrition goals with input from  patient. No MNT follow up scheduled at this time; patient will schedule after colonoscopy if needed.  Education Materials given:  Fiber Restricted Nutrition Therapy (NCM) Visit summary with goals/ instructions   Learner/ who was taught:  Patient    Level of understanding: Verbalizes/ demonstrates competency   Demonstrated degree of understanding via:   Teach back Learning barriers: None  Willingness to learn/ readiness for change: Eager, change in progress  Monitoring and Evaluation:  Dietary intake, exercise, GI symptoms, and body weight      follow up: prn

## 2021-05-29 NOTE — Patient Instructions (Signed)
Try following low fiber eating pattern for several weeks to allow inflammation in GI tract to improve.  Look for nondairy protein drinks/ supplements; these could be made into a smoothie with some frozen fruits, ground nuts, nut butter, and/or vegetables.  Make sure to have a snack at bedtime that includes some starch or fruit and some protein

## 2021-05-29 NOTE — Telephone Encounter (Signed)
Pt states she had discussed with you about getting a colonoscopy... She states she would like to proceed with colonoscopy at this time... please advise

## 2021-05-30 ENCOUNTER — Telehealth: Payer: Self-pay | Admitting: Gastroenterology

## 2021-05-30 NOTE — Telephone Encounter (Signed)
Diagnostic center requesting  call back. Message was left on VM  Could not quite catch the name of the place but the gentleman said he received two different requests for tests and he wants to know which one to order. He is requesting a call back

## 2021-06-03 ENCOUNTER — Other Ambulatory Visit: Payer: Self-pay

## 2021-06-03 DIAGNOSIS — R195 Other fecal abnormalities: Secondary | ICD-10-CM

## 2021-06-03 MED ORDER — CLENPIQ 10-3.5-12 MG-GM -GM/160ML PO SOLN: ORAL | 0 refills | Status: DC

## 2021-06-03 NOTE — Addendum Note (Signed)
Addended by: Lurlean Nanny on: 06/03/2021 02:49 PM   Modules accepted: Orders

## 2021-06-03 NOTE — Telephone Encounter (Signed)
Order refaxed with the correct orderconfirmed

## 2021-06-03 NOTE — Telephone Encounter (Signed)
Pt is aware. Procedure scheduled for October 19, prep sent to pharmacy and instructions released via mychart... pt will reach out to pharmacy to check on Clenpiq Rx to see if it is covered and affordable for her.

## 2021-06-10 LAB — SPECIMEN STATUS REPORT

## 2021-06-10 LAB — ENDOMYSIAL IGA ANTIBODY: Endomysial IgA: NEGATIVE

## 2021-06-17 ENCOUNTER — Other Ambulatory Visit: Payer: Self-pay

## 2021-06-17 ENCOUNTER — Ambulatory Visit (INDEPENDENT_AMBULATORY_CARE_PROVIDER_SITE_OTHER): Payer: Medicare Other | Admitting: Gastroenterology

## 2021-06-17 DIAGNOSIS — Z23 Encounter for immunization: Secondary | ICD-10-CM | POA: Diagnosis not present

## 2021-06-18 ENCOUNTER — Encounter: Payer: Self-pay | Admitting: General Surgery

## 2021-06-19 ENCOUNTER — Encounter: Payer: Self-pay | Admitting: Gastroenterology

## 2021-06-19 ENCOUNTER — Ambulatory Visit
Admission: RE | Admit: 2021-06-19 | Discharge: 2021-06-19 | Disposition: A | Discharge status: Home / Self Care | Payer: Medicare Other | Attending: Gastroenterology | Admitting: Gastroenterology

## 2021-06-19 ENCOUNTER — Ambulatory Visit: Payer: Medicare Other | Admitting: Anesthesiology

## 2021-06-19 ENCOUNTER — Encounter
Admission: RE | Disposition: A | Discharge status: Home / Self Care | Payer: Self-pay | Source: Home / Self Care | Attending: Gastroenterology

## 2021-06-19 DIAGNOSIS — Z880 Allergy status to penicillin: Secondary | ICD-10-CM | POA: Insufficient documentation

## 2021-06-19 DIAGNOSIS — Z881 Allergy status to other antibiotic agents status: Secondary | ICD-10-CM | POA: Diagnosis not present

## 2021-06-19 DIAGNOSIS — Z885 Allergy status to narcotic agent status: Secondary | ICD-10-CM | POA: Insufficient documentation

## 2021-06-19 DIAGNOSIS — Z888 Allergy status to other drugs, medicaments and biological substances status: Secondary | ICD-10-CM | POA: Diagnosis not present

## 2021-06-19 DIAGNOSIS — Z91012 Allergy to eggs: Secondary | ICD-10-CM | POA: Insufficient documentation

## 2021-06-19 DIAGNOSIS — Z87891 Personal history of nicotine dependence: Secondary | ICD-10-CM | POA: Diagnosis not present

## 2021-06-19 DIAGNOSIS — Z681 Body mass index (BMI) 19 or less, adult: Secondary | ICD-10-CM | POA: Diagnosis not present

## 2021-06-19 DIAGNOSIS — R109 Unspecified abdominal pain: Secondary | ICD-10-CM | POA: Diagnosis not present

## 2021-06-19 DIAGNOSIS — R634 Abnormal weight loss: Secondary | ICD-10-CM | POA: Insufficient documentation

## 2021-06-19 DIAGNOSIS — K649 Unspecified hemorrhoids: Secondary | ICD-10-CM | POA: Diagnosis not present

## 2021-06-19 DIAGNOSIS — R195 Other fecal abnormalities: Secondary | ICD-10-CM

## 2021-06-19 DIAGNOSIS — K648 Other hemorrhoids: Secondary | ICD-10-CM | POA: Diagnosis not present

## 2021-06-19 DIAGNOSIS — Z886 Allergy status to analgesic agent status: Secondary | ICD-10-CM | POA: Diagnosis not present

## 2021-06-19 DIAGNOSIS — R197 Diarrhea, unspecified: Secondary | ICD-10-CM | POA: Insufficient documentation

## 2021-06-19 HISTORY — PX: COLONOSCOPY WITH PROPOFOL: SHX5780

## 2021-06-19 SURGERY — COLONOSCOPY WITH PROPOFOL
Anesthesia: General

## 2021-06-19 MED ORDER — PROPOFOL 10 MG/ML IV BOLUS
INTRAVENOUS | Status: AC
  Filled 2021-06-19: qty 20

## 2021-06-19 MED ORDER — PROPOFOL 10 MG/ML IV BOLUS
INTRAVENOUS | Status: AC
  Filled 2021-06-19: qty 40

## 2021-06-19 MED ORDER — SODIUM CHLORIDE 0.9 % IV SOLN: INTRAVENOUS | Status: DC

## 2021-06-19 MED ORDER — PROPOFOL 10 MG/ML IV BOLUS
INTRAVENOUS | Status: DC | PRN
  Administered 2021-06-19 (×3): 20 mg via INTRAVENOUS
  Administered 2021-06-19 (×2): 30 mg via INTRAVENOUS
  Administered 2021-06-19: 20 mg via INTRAVENOUS
  Administered 2021-06-19: 80 mg via INTRAVENOUS

## 2021-06-19 NOTE — Anesthesia Preprocedure Evaluation (Signed)
Anesthesia Evaluation  Patient identified by MRN, date of birth, ID band Patient awake    Reviewed: Allergy & Precautions, NPO status , Patient's Chart, lab work & pertinent test results  History of Anesthesia Complications Negative for: history of anesthetic complications  Airway Mallampati: II  TM Distance: >3 FB Neck ROM: Full    Dental no notable dental hx.    Pulmonary neg sleep apnea, neg COPD, Not current smoker, former smoker,    Pulmonary exam normal        Cardiovascular (-) hypertension(-) Past MI and (-) CHF Normal cardiovascular exam(-) dysrhythmias (-) Valvular Problems/Murmurs     Neuro/Psych neg Seizures Anxiety    GI/Hepatic Neg liver ROS, Bowel prep,GERD  Medicated,  Endo/Other  neg diabetes  Renal/GU negative Renal ROS     Musculoskeletal   Abdominal   Peds  Hematology   Anesthesia Other Findings Allergy    Bronchiectasis (HCC)    GERD (gastroesophageal reflux disease)   Gross hematuria 02/25/2019   IBS (irritable bowel syndrome)    Osteoporosis       Reproductive/Obstetrics                             Anesthesia Physical  Anesthesia Plan  ASA: 2  Anesthesia Plan: General   Post-op Pain Management:    Induction: Intravenous  PONV Risk Score and Plan: 3 and Propofol infusion and TIVA  Airway Management Planned: Nasal Cannula and Natural Airway  Additional Equipment:   Intra-op Plan:   Post-operative Plan:   Informed Consent: I have reviewed the patients History and Physical, chart, labs and discussed the procedure including the risks, benefits and alternatives for the proposed anesthesia with the patient or authorized representative who has indicated his/her understanding and acceptance.       Plan Discussed with: CRNA, Anesthesiologist and Surgeon  Anesthesia Plan Comments:         Anesthesia Quick Evaluation

## 2021-06-19 NOTE — H&P (Signed)
Vonda Antigua, MD [address] [place], [place], [place], [id number] [id number], [place], [place], [place], [address] Phone: [phone]  Fax: [phone]  Primary Care Physician:  Glean Hess, MD   Pre-Procedure History & Physical: HPI:  Audrey Hayden is a 70 y.o. female is here for a colonoscopy.   Past Medical History:  Diagnosis Date   Allergy    Bronchiectasis ([place])    GERD (gastroesophageal reflux disease)    Gross hematuria 02/25/2019   IBS (irritable bowel syndrome)    Osteoporosis     Past Surgical History:  Procedure Laterality Date   BREAST BIOPSY Left    neg   COLONOSCOPY  2004   ESOPHAGOGASTRODUODENOSCOPY (EGD) WITH PROPOFOL N/A 12/05/2020   Procedure: ESOPHAGOGASTRODUODENOSCOPY (EGD) WITH PROPOFOL;  Surgeon: Virgel Manifold, MD;  Location: [facility] ENDOSCOPY;  Service: Endoscopy;  Laterality: N/A;   TONSILLECTOMY     UPPER GI ENDOSCOPY  12/2018    Prior to Admission medications   Medication Sig Start Date End Date Taking? Authorizing Provider  Calcium-Phosphorus-Vitamin D (CITRACAL +D3 PO) Take 2 tablets by mouth 2 (two) times daily.   Yes [provider]  cetirizine (ZYRTEC) 10 MG tablet Take 10 mg by mouth daily.   Yes [provider]  L-Lysine 1000 MG TABS Take by mouth.   Yes [provider]  Magnesium 250 MG TABS Take 1 tablet by mouth in the morning and at bedtime.   Yes [provider]  Misc Natural Products (ELDERBERRY ZINC/VIT C/IMMUNE MT) Use as directed in the mouth or throat. syrup   Yes [provider]  Multiple Vitamins-Minerals (ALIVE WOMENS 50+ PO)    Yes [provider]  Probiotic Product (PROBIOTIC BLEND PO)     [provider]  Quercetin 250 MG TABS Take 500 mg by mouth at bedtime.    [provider]  Sod Picosulfate-Mag Ox-Cit Acd (CLENPIQ) 10-3.5-12 MG-GM -GM/160ML SOLN Use as instructed 06/03/21   Virgel Manifold, MD    Allergies as of  06/03/2021 - Review Complete 05/28/2021  Allergen Reaction Noted   Eggs or egg-derived products Itching and Rash 07/21/2019   Amoxicillin Other (See Comments) 09/18/2014   Azelastine hcl Other (See Comments) 01/18/2021   Cefdinir  09/18/2014   Clarithromycin Other (See Comments) 09/18/2014   Codeine Other (See Comments) 09/18/2014   Hydromorphone hcl Other (See Comments) 09/18/2014   Hydromorphone hcl Other (See Comments) 06/10/2020   Ibuprofen Other (See Comments) 01/18/2021   Meperidine Other (See Comments) 09/18/2014   Montelukast Other (See Comments) 01/18/2021   Moxifloxacin Other (See Comments) 11/14/2015   Nsaids  09/18/2014   Omeprazole Other (See Comments) 06/19/2015   Other Other (See Comments) 09/18/2014   Prednisone Other (See Comments) 09/18/2014   Pseudoephedrine Other (See Comments) 01/18/2021   Pseudoephedrine hcl  09/18/2014   Ranitidine  59/93/5701   Salicylates Hives 77/93/9030   Aspirin Hives and Rash 09/18/2014   Dexilant [dexlansoprazole] Rash 09/12/2019   Doxycycline Other (See Comments) 09/14/2017   Montelukast sodium Palpitations 09/18/2014    Family History  Problem Relation Age of Onset   Hypertension Mother    CAD Father    Heart attack Father    Breast cancer Neg Hx    Colon cancer Neg Hx     Social History   Socioeconomic History   Marital status: Married    Spouse name: Not on file   Number of children: 2   Years of education: Not on file  Highest education level: Bachelor's degree (e.g., BA, AB, BS)  Occupational History   Occupation: Retired  Tobacco Use   Smoking status: Former    Packs/day: 1.50    Years: 5.00    Pack years: 7.50    Types: Cigarettes    Start date: 36    Quit date: 04/02/1975    Years since quitting: 46.2   Smokeless tobacco: Never   Tobacco comments:    smoking cessation materials not required  Vaping Use   Vaping Use: Never used  Substance and Sexual Activity   Alcohol use: No    Alcohol/week: 0.0  standard drinks   Drug use: No   Sexual activity: Not Currently  Other Topics Concern   Not on file  Social History Narrative   Not on file   Social Determinants of Health   Financial Resource Strain: Low Risk    Difficulty of Paying Living Expenses: Not hard at all  Food Insecurity: No Food Insecurity   Worried About Charity fundraiser in the Last Year: Never true   Ran Out of Food in the Last Year: Never true  Transportation Needs: No Transportation Needs   Lack of Transportation (Medical): No   Lack of Transportation (Non-Medical): No  Physical Activity: Sufficiently Active   Days of Exercise per Week: 7 days   Minutes of Exercise per Session: 30 min  Stress: No Stress Concern Present   Feeling of Stress : Only a little  Social Connections: Moderately Integrated   Frequency of Communication with Friends and Family: More than three times a week   Frequency of Social Gatherings with Friends and Family: Three times a week   Attends Religious Services: More than 4 times per year   Active Member of Clubs or Organizations: No   Attends Archivist Meetings: Never   Marital Status: Married  Human resources officer Violence: Not At Risk   Fear of Current or Ex-Partner: No   Emotionally Abused: No   Physically Abused: No   Sexually Abused: No    Review of Systems: See HPI, otherwise negative ROS  Physical Exam: Constitutional: General:   Alert,  Well-developed, well-nourished, pleasant and cooperative in NAD BP 129/67   Pulse 65   Temp (!) 96.7 F (35.9 C) (Temporal)   Resp 16   Ht 5\' 8"  (1.727 m)   Wt 49.7 kg   SpO2 100%   BMI 16.65 kg/m   Head: Normocephalic, atraumatic.   Eyes:  Sclera clear, no icterus.   Conjunctiva pink.   Mouth:  No deformity or lesions, oropharynx pink & moist.  Neck:  Supple, trachea midline  Respiratory: Normal respiratory effort  Gastrointestinal:  Soft, non-tender and non-distended without masses, hepatosplenomegaly or hernias  noted.  No guarding or rebound tenderness.     Cardiac: No clubbing or edema.  No cyanosis. Normal posterior tibial pedal pulses noted.  Lymphatic:  No significant cervical adenopathy.  Psych:  Alert and cooperative. Normal mood and affect.  Musculoskeletal:   Symmetrical without gross deformities. 5/5 Lower extremity strength bilaterally.  Skin: Warm. Intact without significant lesions or rashes. No jaundice.  Neurologic:  Face symmetrical, tongue midline, Normal sensation to touch;  grossly normal neurologically.  Psych:  Alert and oriented x3, Alert and cooperative. Normal mood and affect.  Impression/Plan: Audrey Hayden is here for a colonoscopy to be performed for loose stool, weight loss  Risks, benefits, limitations, and alternatives regarding  colonoscopy have been reviewed with the patient.  Questions have  been answered.  All parties agreeable.   Virgel Manifold, MD  06/19/2021, 9:17 AM

## 2021-06-19 NOTE — Transfer of Care (Signed)
Immediate Anesthesia Transfer of Care Note  Patient: Audrey Hayden  Procedure(s) Performed: COLONOSCOPY WITH PROPOFOL  Patient Location: PACU  Anesthesia Type:General  Level of Consciousness: sedated  Airway & Oxygen Therapy: Patient Spontanous Breathing  Post-op Assessment: Report given to RN and Post -op Vital signs reviewed and stable  Post vital signs: Reviewed and stable  Last Vitals:  Vitals Value Taken Time  BP 98/60 06/19/21 0952  Temp    Pulse 64 06/19/21 0953  Resp 22 06/19/21 0953  SpO2 100 % 06/19/21 0953  Vitals shown include unvalidated device data.  Last Pain:  Vitals:   06/19/21 0821  TempSrc: Temporal         Complications: No notable events documented.

## 2021-06-19 NOTE — Op Note (Signed)
[facility]astroenterology Patient Name: Haiden Clucas Procedure Date: 06/19/2021 9:09 AM MRN: [id number] Account #: [id number] Date of Birth: February 17, 1951 Admit Type: Outpatient Age: 70 Room: [facility] ENDO ROOM 3 Gender: Female Note Status: Finalized Instrument Name: Park Meo [id number] Procedure:             Colonoscopy Indications:           Diarrhea, Weight loss Providers:             Reanna Scoggin B. Bonna Gains MD, MD Referring MD:          Halina Maidens, MD (Referring MD) Medicines:             Monitored Anesthesia Care Complications:         No immediate complications. Procedure:             Pre-Anesthesia Assessment:                        - Prior to the procedure, a History and Physical was                         performed, and patient medications, allergies and                         sensitivities were reviewed. The patient's tolerance                         of previous anesthesia was reviewed.                        - The risks and benefits of the procedure and the                         sedation options and risks were discussed with the                         patient. All questions were answered and informed                         consent was obtained.                        - Patient identification and proposed procedure were                         verified prior to the procedure by the physician, the                         nurse, the anesthetist and the technician. The                         procedure was verified in the pre-procedure area in                         the procedure room in the endoscopy suite.                        - ASA Grade Assessment: II - A patient with mild  systemic disease.                        - After reviewing the risks and benefits, the patient                         was deemed in satisfactory condition to undergo the                         procedure.                        After obtaining  informed consent, the colonoscope was                         passed under direct vision. Throughout the procedure,                         the patient's blood pressure, pulse, and oxygen                         saturations were monitored continuously. The                         Colonoscope was introduced through the anus and                         advanced to the the terminal ileum. The colonoscopy                         was performed with ease. The patient tolerated the                         procedure well. The quality of the bowel preparation                         was good. Findings:      The perianal and digital rectal examinations were normal.      The rectum, sigmoid colon, descending colon, transverse colon, ascending       colon, cecum and ileum appeared normal. Biopsies for histology were       taken with a cold forceps from the entire colon for evaluation of       microscopic colitis.      Non-bleeding internal hemorrhoids were found during retroflexion. Impression:            - The rectum, sigmoid colon, descending colon,                         transverse colon, ascending colon, cecum and terminal                         ileum are normal. Biopsied.                        - Non-bleeding internal hemorrhoids. Recommendation:        - Await pathology results.                        - High fiber diet.                        -  Discharge patient to home.                        - Resume previous diet.                        - Continue present medications.                        - Return to primary care physician as previously                         scheduled.                        - The findings and recommendations were discussed with                         the patient.                        - The findings and recommendations were discussed with                         the patient's family. Procedure Code(s):     --- Professional ---                        [phone],  Colonoscopy, flexible; with biopsy, single or                         multiple Diagnosis Code(s):     --- Professional ---                        R19.7, Diarrhea, unspecified                        R63.4, Abnormal weight loss CPT copyright 2019 American Medical Association. All rights reserved. The codes documented in this report are preliminary and upon coder review may  be revised to meet current compliance requirements.  Vonda Antigua, MD Margretta Sidle B. Bonna Gains MD, MD 06/19/2021 9:53:26 AM This report has been signed electronically. Number of Addenda: 0 Note Initiated On: 06/19/2021 9:09 AM Scope Withdrawal Time: 0 hours 14 minutes 55 seconds  Total Procedure Duration: 0 hours 24 minutes 19 seconds  Estimated Blood Loss:  Estimated blood loss: none.      [facility]

## 2021-06-19 NOTE — Anesthesia Postprocedure Evaluation (Signed)
Anesthesia Post Note  Patient: Audrey Hayden  Procedure(s) Performed: COLONOSCOPY WITH PROPOFOL  Patient location during evaluation: PACU Anesthesia Type: General Level of consciousness: awake and alert, awake and oriented Pain management: pain level controlled Vital Signs Assessment: post-procedure vital signs reviewed and stable Respiratory status: spontaneous breathing, nonlabored ventilation and respiratory function stable Cardiovascular status: blood pressure returned to baseline and stable Postop Assessment: no apparent nausea or vomiting Anesthetic complications: no   No notable events documented.   Last Vitals:  Vitals:   06/19/21 1012 06/19/21 1022  BP: 97/67 114/66  Pulse: (!) 58 (!) 58  Resp: 14 18  Temp:    SpO2: 100% 100%    Last Pain:  Vitals:   06/19/21 1022  TempSrc:   PainSc: 0-No pain                 Phill Mutter

## 2021-06-20 ENCOUNTER — Encounter: Payer: Self-pay | Admitting: Gastroenterology

## 2021-06-20 LAB — SURGICAL PATHOLOGY

## 2021-06-28 ENCOUNTER — Encounter: Payer: Medicare Other | Attending: Gastroenterology | Admitting: Dietician

## 2021-06-28 ENCOUNTER — Encounter: Payer: Self-pay | Admitting: Dietician

## 2021-06-28 ENCOUNTER — Other Ambulatory Visit: Payer: Self-pay

## 2021-06-28 VITALS — Wt 110.0 lb

## 2021-06-28 DIAGNOSIS — R634 Abnormal weight loss: Secondary | ICD-10-CM | POA: Insufficient documentation

## 2021-06-28 DIAGNOSIS — Z681 Body mass index (BMI) 19 or less, adult: Secondary | ICD-10-CM | POA: Diagnosis not present

## 2021-06-28 NOTE — Progress Notes (Signed)
Medical Nutrition Therapy: Visit start time: 0815  end time: 0900  Assessment:  Diagnosis: abnormal weight loss Medical history changes: patient reports new diagnosis of celiac disease Psychosocial issues/ stress concerns: anxiety, stress related to health and weight loss  Current weight: 110.0lbs with shoes  Height: 5'8" BMI: 16.73 Medications, supplement changes:   Progress and evaluation:  Patient reports some improvement in reflux, able to eat a little more food, sleeping better at night. She has experienced additional weight loss, reports early am weight of 107.8lbs yesterday. She is checking weight at home daily. Has been diagnosed with celiac disease, also mucus detected in colon.  Chronic cough continues with mucus. Patient states she has scheduled an appointment with a psychiatrist to help in dealing with her health concerns.   Physical activity: not assessed today  Dietary Intake:  3 meals and 2 snacks daily Protein source with most meals and snacks. Gluten free grains/ starches are present in most meals -- potatoes, rice, corn, oats Patient is keeping food journal daily.  Nutrition Care Education: Topics covered:     Weight gain Gluten free diet Low fiber, frequent small meals and snacks to promote GI healing/ symptom relief  Nutritional Diagnosis:  [place]-1.4 Altered GI function As related to IBS, celiac disease/ gluten intolerance, excess mucus.  As evidenced by patient reported symptoms . -3.2 Unintentional weight loss As related to GI distress.  As evidenced by patient with current BMI of 16.73.  Intervention:  Instructed on overt and obscure sources of gluten in foods.  Patient has had celiac testing in the past which was negative, but does feel better when avoiding gluten. She states recent tests were positive. Advised continuing on low fiber diet to promote GI healing Advised including additional snack(s) or light meal for increased caloric intake; discussed  liquid options to enhance nutrition/ calories.  Education Materials given:  Gluten free Diet booklet ([employer]k Med) Visit summary with goals/ instructions   Learner/ who was taught:  Patient    Level of understanding: Verbalizes/ demonstrates competency   Demonstrated degree of understanding via:   Teach back Learning barriers: None  Willingness to learn/ readiness for change: Eager, change in progress   Monitoring and Evaluation:  Dietary intake, GI symptoms, and body weight      follow up: prn

## 2021-06-28 NOTE — Patient Instructions (Signed)
Eat a strict gluten free diet avoiding obvious and "hidden" sources of gluten in foods. Continue with low fiber diet to avoid further GI irritation. Once symptoms resolve, add fiber back gradually.  Plan to eat something or have a protein or meal replacement drink every 2-3 hours during the day. This will help increase calories without increasing pressure/ irritation to GI tract.  Include small amounts of fats with meals and snacks to help with calorie intake.

## 2021-07-10 ENCOUNTER — Ambulatory Visit: Payer: Medicare Other

## 2021-07-10 DIAGNOSIS — F5105 Insomnia due to other mental disorder: Secondary | ICD-10-CM | POA: Diagnosis not present

## 2021-07-10 DIAGNOSIS — F411 Generalized anxiety disorder: Secondary | ICD-10-CM | POA: Diagnosis not present

## 2021-07-11 DIAGNOSIS — Z20822 Contact with and (suspected) exposure to covid-19: Secondary | ICD-10-CM | POA: Diagnosis not present

## 2021-07-15 ENCOUNTER — Ambulatory Visit (INDEPENDENT_AMBULATORY_CARE_PROVIDER_SITE_OTHER): Payer: Medicare Other

## 2021-07-15 ENCOUNTER — Other Ambulatory Visit: Payer: Self-pay

## 2021-07-15 VITALS — BP 110/70 | HR 70 | Temp 98.3°F | Resp 16 | Ht 68.0 in | Wt 111.4 lb

## 2021-07-15 DIAGNOSIS — Z1231 Encounter for screening mammogram for malignant neoplasm of breast: Secondary | ICD-10-CM

## 2021-07-15 DIAGNOSIS — Z0000 Encounter for general adult medical examination without abnormal findings: Secondary | ICD-10-CM

## 2021-07-15 NOTE — Progress Notes (Signed)
Subjective:   Audrey Hayden is a 70 y.o. female who presents for Medicare Annual (Subsequent) preventive examination.  Review of Systems     Cardiac Risk Factors include: advanced age (>21men, >26 women)     Objective:    Today's Vitals   07/15/21 1331  BP: 110/70  Pulse: 70  Resp: 16  Temp: 98.3 F (36.8 C)  TempSrc: Oral  SpO2: 95%  Weight: 111 lb 6.4 oz (50.5 kg)  Height: 5\' 8"  (1.727 m)   Body mass index is 16.94 kg/m.  Advanced Directives 07/15/2021 06/19/2021 03/26/2021 03/01/2021 12/05/2020 07/09/2020 08/25/2019  Does Patient Have a Medical Advance Directive? Yes Yes Yes Yes No Yes Yes  Type of Paramedic of [place];Living will [place];Living will Living will Living will;Healthcare Power of [place];Living will Living will  Does patient want to make changes to medical advance directive? - - No - Patient declined - - - -  Copy of [place] in Chart? No - copy requested No - copy requested - - - No - copy requested -  Would patient like information on creating a medical advance directive? - - No - Patient declined - No - Patient declined - No - Patient declined    Current Medications (verified) Outpatient Encounter Medications as of 07/15/2021  Medication Sig   Calcium-Phosphorus-Vitamin D (CITRACAL +D3 PO) Take 2 tablets by mouth 2 (two) times daily.   cetirizine (ZYRTEC) 10 MG tablet Take 10 mg by mouth daily.   Magnesium 250 MG TABS Take 1 tablet by mouth in the morning and at bedtime.   Misc Natural Products (ELDERBERRY ZINC/VIT C/IMMUNE MT) Use as directed in the mouth or throat. syrup   Multiple Vitamins-Minerals (ALIVE WOMENS 50+ PO)    Pancreatin 500 MG CAPS Take by mouth. Digestive enzymes   Probiotic Product (PROBIOTIC BLEND PO)    Quercetin 250 MG TABS Take 500 mg by mouth at bedtime.   mirtazapine (REMERON) 15 MG tablet Take 15 mg by mouth at bedtime.    [DISCONTINUED] L-Lysine 1000 MG TABS Take by mouth.   [DISCONTINUED] Sod Picosulfate-Mag Ox-Cit Acd (CLENPIQ) 10-3.5-12 MG-GM -GM/160ML SOLN Use as instructed   No facility-administered encounter medications on file as of 07/15/2021.    Allergies (verified) Eggs or egg-derived products, Amoxicillin, Azelastine hcl, Cefdinir, Clarithromycin, Codeine, Hydromorphone hcl, Hydromorphone hcl, Ibuprofen, Meperidine, Montelukast, Moxifloxacin, Nsaids, Omeprazole, Other, Prednisone, Pseudoephedrine, Pseudoephedrine hcl, Ranitidine, Salicylates, Aspirin, Dexilant [dexlansoprazole], Doxycycline, and Montelukast sodium   History: Past Medical History:  Diagnosis Date   Allergy    Bronchiectasis ([place])    Celiac disease    GERD (gastroesophageal reflux disease)    Gross hematuria 02/25/2019   IBS (irritable bowel syndrome)    Osteoporosis    Past Surgical History:  Procedure Laterality Date   BREAST BIOPSY Left    neg   COLONOSCOPY  2004   COLONOSCOPY WITH PROPOFOL N/A 06/19/2021   Procedure: COLONOSCOPY WITH PROPOFOL;  Surgeon: Virgel Manifold, MD;  Location: [facility] ENDOSCOPY;  Service: Endoscopy;  Laterality: N/A;   ESOPHAGOGASTRODUODENOSCOPY (EGD) WITH PROPOFOL N/A 12/05/2020   Procedure: ESOPHAGOGASTRODUODENOSCOPY (EGD) WITH PROPOFOL;  Surgeon: Virgel Manifold, MD;  Location: [facility] ENDOSCOPY;  Service: Endoscopy;  Laterality: N/A;   TONSILLECTOMY     UPPER GI ENDOSCOPY  12/2018   Family History  Problem Relation Age of Onset   Hypertension Mother    CAD Father    Heart attack Father  Breast cancer Neg Hx    Colon cancer Neg Hx    Social History   Socioeconomic History   Marital status: Married    Spouse name: Not on file   Number of children: 2   Years of education: Not on file   Highest education level: Bachelor's degree (e.g., BA, AB, BS)  Occupational History   Occupation: Retired  Tobacco Use   Smoking status: Former    Packs/day: 1.50    Years: 5.00    Pack  years: 7.50    Types: Cigarettes    Start date: 89    Quit date: 04/02/1975    Years since quitting: 46.3   Smokeless tobacco: Never   Tobacco comments:    smoking cessation materials not required  Vaping Use   Vaping Use: Never used  Substance and Sexual Activity   Alcohol use: No    Alcohol/week: 0.0 standard drinks   Drug use: No   Sexual activity: Not Currently  Other Topics Concern   Not on file  Social History Narrative   Not on file   Social Determinants of Health   Financial Resource Strain: Low Risk    Difficulty of Paying Living Expenses: Not hard at all  Food Insecurity: No Food Insecurity   Worried About Charity fundraiser in the Last Year: Never true   Ran Out of Food in the Last Year: Never true  Transportation Needs: No Transportation Needs   Lack of Transportation (Medical): No   Lack of Transportation (Non-Medical): No  Physical Activity: Sufficiently Active   Days of Exercise per Week: 7 days   Minutes of Exercise per Session: 30 min  Stress: No Stress Concern Present   Feeling of Stress : Not at all  Social Connections: Moderately Integrated   Frequency of Communication with Friends and Family: More than three times a week   Frequency of Social Gatherings with Friends and Family: Three times a week   Attends Religious Services: More than 4 times per year   Active Member of Clubs or Organizations: No   Attends Archivist Meetings: Never   Marital Status: Married    Tobacco Counseling Counseling given: Not Answered Tobacco comments: smoking cessation materials not required   Clinical Intake:  Pre-visit preparation completed: Yes  Pain : No/denies pain     BMI - recorded: 16.94 Nutritional Status: BMI <19  Underweight Nutritional Risks: None Diabetes: No  How often do you need to have someone help you when you read instructions, pamphlets, or other written materials from your doctor or pharmacy?: 1 - Never    Interpreter  Needed?: No  Information entered by :: Clemetine Marker LPN   Activities of Daily Living In your present state of health, do you have any difficulty performing the following activities: 07/15/2021 05/28/2021  Hearing? N N  Vision? N N  Difficulty concentrating or making decisions? N N  Walking or climbing stairs? N N  Dressing or bathing? N N  Doing errands, shopping? N N  Some recent data might be hidden    Patient Care Team: Glean Hess, MD as PCP - General (Internal Medicine) Benjaman Kindler, MD as Consulting Physician (Obstetrics and Gynecology) Erby Pian, MD as Referring Physician (Pulmonary Disease) Gabriel Carina Betsey Holiday, MD as Physician Assistant (Endocrinology)  Indicate any recent Medical Services you may have received from other than [facility] providers in the past year (date may be approximate).     Assessment:   This is a  routine wellness examination for [place].  Hearing/Vision screen Hearing Screening - Comments:: Pt denies hearing difficulty Vision Screening - Comments:: Annual vision screening with Dr. Gloriann Loan  Dietary issues and exercise activities discussed: Current Exercise Habits: Home exercise routine, Type of exercise: walking, Time (Minutes): 30, Frequency (Times/Week): 7, Weekly Exercise (Minutes/Week): 210, Intensity: Moderate, Exercise limited by: None identified   Goals Addressed             This Visit's Progress    DIET - INCREASE LEAN PROTEINS   On track    Recommend to drink 1-2 protein shakes per day     DIET - INCREASE WATER INTAKE   On track    Recommend to drink at least 6-8 8oz glasses of water per day.       Depression Screen PHQ 2/9 Scores 07/15/2021 05/28/2021 05/03/2021 03/07/2021 03/01/2021 02/15/2021 10/26/2020  PHQ - 2 Score 0 0 0 0 1 0 0  PHQ- 9 Score 1 4 0 1 - 3 4    Fall Risk Fall Risk  07/15/2021 05/28/2021 05/03/2021 03/07/2021 03/01/2021  Falls in the past year? 1 1 0 0 0  Comment - - - - occasional dizzy spells  Number falls in past  yr: 0 1 0 0 -  Injury with Fall? 0 0 0 0 -  Risk for fall due to : History of fall(s) History of fall(s) No Fall Risks - -  Risk for fall due to: Comment - - - - -  Follow up Falls prevention discussed Falls evaluation completed Falls evaluation completed Falls evaluation completed -    FALL RISK PREVENTION PERTAINING TO THE HOME:  Any stairs in or around the home? No  If so, are there any without handrails? No  Home free of loose throw rugs in walkways, pet beds, electrical cords, etc? Yes  Adequate lighting in your home to reduce risk of falls? Yes   ASSISTIVE DEVICES UTILIZED TO PREVENT FALLS:  Life alert? No  Use of a cane, walker or w/c? No  Grab bars in the bathroom? Yes  Shower chair or bench in shower? Yes  Elevated toilet seat or a handicapped toilet? No   TIMED UP AND GO:  Was the test performed? Yes .  Length of time to ambulate 10 feet: 5 sec.   Gait steady and fast without use of assistive device  Cognitive Function: Normal cognitive status assessed by direct observation by this Nurse Health Advisor. No abnormalities found.       6CIT Screen 04/26/2018 04/20/2017  What Year? 0 points 0 points  What month? 0 points 0 points  What time? 0 points 0 points  Count back from 20 0 points 0 points  Months in reverse 0 points 0 points  Repeat phrase 0 points 0 points  Total Score 0 0    Immunizations Immunization History  Administered Date(s) Administered   Fluad Quad(high Dose 65+) 05/03/2021   Hep A / Hep B 05/15/2021, 06/17/2021   Influenza, Quadrivalent, Recombinant, Inj, Pf 07/21/2019, 06/12/2020   Influenza-Unspecified 07/06/2015   PFIZER(Purple Top)SARS-COV-2 Vaccination 10/21/2019, 11/11/2019   Pneumococcal Conjugate-13 11/09/2017   Tdap 10/27/2018    TDAP status: Up to date  Flu Vaccine status: Up to date  Pneumococcal vaccine status: Up to date  Covid-19 vaccine status: Completed vaccines  Qualifies for Shingles Vaccine? Yes   Zostavax  completed No   Shingrix Completed?: No.    Education has been provided regarding the importance of this vaccine. Patient has been advised  to call insurance company to determine out of pocket expense if they have not yet received this vaccine. Advised may also receive vaccine at local pharmacy or Health Dept. Verbalized acceptance and understanding.  Screening Tests Health Maintenance  Topic Date Due   Zoster Vaccines- Shingrix (1 of 2) Never done   Pneumonia Vaccine 82+ Years old (2 - PPSV23 if available, else PCV20) 11/10/2018   COVID-19 Vaccine (3 - Booster for Pfizer series) 01/06/2020   MAMMOGRAM  09/10/2021   TETANUS/TDAP  10/27/2028   COLONOSCOPY (Pts 45-67yrs Insurance coverage will need to be confirmed)  06/20/2031   INFLUENZA VACCINE  Completed   DEXA SCAN  Completed   Hepatitis C Screening  Completed   HPV VACCINES  Aged Out    Health Maintenance  Health Maintenance Due  Topic Date Due   Zoster Vaccines- Shingrix (1 of 2) Never done   Pneumonia Vaccine 67+ Years old (2 - PPSV23 if available, else PCV20) 11/10/2018   COVID-19 Vaccine (3 - Booster for [place] series) 01/06/2020    Colorectal cancer screening: Type of screening: Colonoscopy. Completed 06/19/21. Repeat every 10 years  Mammogram status: Completed 09/10/20. Repeat every year. Ordered today  Bone Density status: Completed 09/10/20. Results reflect: Bone density results: OSTEOPOROSIS. Repeat every 2 years.  Lung Cancer Screening: (Low Dose CT Chest recommended if Age 54-80 years, 30 pack-year currently smoking OR have quit w/in 15years.) does not qualify.   Additional Screening:  Hepatitis C Screening: does qualify; Completed 05/08/21  Vision Screening: Recommended annual ophthalmology exams for early detection of glaucoma and other disorders of the eye. Is the patient up to date with their annual eye exam?  Yes  Who is the provider or what is the name of the office in which the patient attends annual eye exams?  Dr. Gloriann Loan.   Dental Screening: Recommended annual dental exams for proper oral hygiene  Community Resource Referral / Chronic Care Management: CRR required this visit?  No   CCM required this visit?  No      Plan:     I have personally reviewed and noted the following in the patient's chart:   Medical and social history Use of alcohol, tobacco or illicit drugs  Current medications and supplements including opioid prescriptions.  Functional ability and status Nutritional status Physical activity Advanced directives List of other physicians Hospitalizations, surgeries, and ER visits in previous 12 months Vitals Screenings to include cognitive, depression, and falls Referrals and appointments  In addition, I have reviewed and discussed with patient certain preventive protocols, quality metrics, and best practice recommendations. A written personalized care plan for preventive services as well as general preventive health recommendations were provided to patient.     Clemetine Marker, LPN   09/[id number]   Nurse Notes: none

## 2021-07-15 NOTE — Patient Instructions (Signed)
Audrey Hayden , Thank you for taking time to come for your Medicare Wellness Visit. I appreciate your ongoing commitment to your health goals. Please review the following plan we discussed and let me know if I can assist you in the future.   Screening recommendations/referrals: Colonoscopy: done 06/19/21. Repeat 06/2031 Mammogram: done 09/10/20. Please call [phone] to schedule your mammogram. Bone Density: done 09/10/20 Recommended yearly ophthalmology/optometry visit for glaucoma screening and checkup Recommended yearly dental visit for hygiene and checkup  Vaccinations: Influenza vaccine: done 05/03/21 Pneumococcal vaccine: done 11/09/17 Tdap vaccine: done 10/29/18 Shingles vaccine: Shingrix discussed. Please contact your pharmacy for coverage information.  Covid-19:done 10/21/19 & 11/11/19  Advanced directives: Please bring a copy of your health care power of attorney and living will to the office at your convenience.   Conditions/risks identified: Keep up the great work!  Next appointment: Follow up in one year for your annual wellness visit    Preventive Care 65 Years and Older, Female Preventive care refers to lifestyle choices and visits with your health care provider that can promote health and wellness. What does preventive care include? A yearly physical exam. This is also called an annual well check. Dental exams once or twice a year. Routine eye exams. Ask your health care provider how often you should have your eyes checked. Personal lifestyle choices, including: Daily care of your teeth and gums. Regular physical activity. Eating a healthy diet. Avoiding tobacco and drug use. Limiting alcohol use. Practicing safe sex. Taking low-dose aspirin every day. Taking vitamin and mineral supplements as recommended by your health care provider. What happens during an annual well check? The services and screenings done by your health care provider during your annual well check  will depend on your age, overall health, lifestyle risk factors, and family history of disease. Counseling  Your health care provider may ask you questions about your: Alcohol use. Tobacco use. Drug use. Emotional well-being. Home and relationship well-being. Sexual activity. Eating habits. History of falls. Memory and ability to understand (cognition). Work and work Statistician. Reproductive health. Screening  You may have the following tests or measurements: Height, weight, and BMI. Blood pressure. Lipid and cholesterol levels. These may be checked every 5 years, or more frequently if you are over 28 years old. Skin check. Lung cancer screening. You may have this screening every year starting at age 26 if you have a 30-pack-year history of smoking and currently smoke or have quit within the past 15 years. Fecal occult blood test (FOBT) of the stool. You may have this test every year starting at age 73. Flexible sigmoidoscopy or colonoscopy. You may have a sigmoidoscopy every 5 years or a colonoscopy every 10 years starting at age 5. Hepatitis C blood test. Hepatitis B blood test. Sexually transmitted disease (STD) testing. Diabetes screening. This is done by checking your blood sugar (glucose) after you have not eaten for a while (fasting). You may have this done every 1-3 years. Bone density scan. This is done to screen for osteoporosis. You may have this done starting at age 15. Mammogram. This may be done every 1-2 years. Talk to your health care provider about how often you should have regular mammograms. Talk with your health care provider about your test results, treatment options, and if necessary, the need for more tests. Vaccines  Your health care provider may recommend certain vaccines, such as: Influenza vaccine. This is recommended every year. Tetanus, diphtheria, and acellular pertussis (Tdap, Td) vaccine. You may need a  Td booster every 10 years. Zoster vaccine. You  may need this after age 69. Pneumococcal 13-valent conjugate (PCV13) vaccine. One dose is recommended after age 28. Pneumococcal polysaccharide (PPSV23) vaccine. One dose is recommended after age 68. Talk to your health care provider about which screenings and vaccines you need and how often you need them. This information is not intended to replace advice given to you by your health care provider. Make sure you discuss any questions you have with your health care provider. Document Released: 09/14/2015 Document Revised: 05/07/2016 Document Reviewed: 06/19/2015 Elsevier Interactive Patient Education  2017 [place] Prevention in the Home Falls can cause injuries. They can happen to people of all ages. There are many things you can do to make your home safe and to help prevent falls. What can I do on the outside of my home? Regularly fix the edges of walkways and driveways and fix any cracks. Remove anything that might make you trip as you walk through a door, such as a raised step or threshold. Trim any bushes or trees on the path to your home. Use bright outdoor lighting. Clear any walking paths of anything that might make someone trip, such as rocks or tools. Regularly check to see if handrails are loose or broken. Make sure that both sides of any steps have handrails. Any raised decks and porches should have guardrails on the edges. Have any leaves, snow, or ice cleared regularly. Use sand or salt on walking paths during winter. Clean up any spills in your garage right away. This includes oil or grease spills. What can I do in the bathroom? Use night lights. Install grab bars by the toilet and in the tub and shower. Do not use towel bars as grab bars. Use non-skid mats or decals in the tub or shower. If you need to sit down in the shower, use a plastic, non-slip stool. Keep the floor dry. Clean up any water that spills on the floor as soon as it happens. Remove soap buildup  in the tub or shower regularly. Attach bath mats securely with double-sided non-slip rug tape. Do not have throw rugs and other things on the floor that can make you trip. What can I do in the bedroom? Use night lights. Make sure that you have a light by your bed that is easy to reach. Do not use any sheets or blankets that are too big for your bed. They should not hang down onto the floor. Have a firm chair that has side arms. You can use this for support while you get dressed. Do not have throw rugs and other things on the floor that can make you trip. What can I do in the kitchen? Clean up any spills right away. Avoid walking on wet floors. Keep items that you use a lot in easy-to-reach places. If you need to reach something above you, use a strong step stool that has a grab bar. Keep electrical cords out of the way. Do not use floor polish or wax that makes floors slippery. If you must use wax, use non-skid floor wax. Do not have throw rugs and other things on the floor that can make you trip. What can I do with my stairs? Do not leave any items on the stairs. Make sure that there are handrails on both sides of the stairs and use them. Fix handrails that are broken or loose. Make sure that handrails are as long as the stairways. Check any  carpeting to make sure that it is firmly attached to the stairs. Fix any carpet that is loose or worn. Avoid having throw rugs at the top or bottom of the stairs. If you do have throw rugs, attach them to the floor with carpet tape. Make sure that you have a light switch at the top of the stairs and the bottom of the stairs. If you do not have them, ask someone to add them for you. What else can I do to help prevent falls? Wear shoes that: Do not have high heels. Have rubber bottoms. Are comfortable and fit you well. Are closed at the toe. Do not wear sandals. If you use a stepladder: Make sure that it is fully opened. Do not climb a closed  stepladder. Make sure that both sides of the stepladder are locked into place. Ask someone to hold it for you, if possible. Clearly mark and make sure that you can see: Any grab bars or handrails. First and last steps. Where the edge of each step is. Use tools that help you move around (mobility aids) if they are needed. These include: Canes. Walkers. Scooters. Crutches. Turn on the lights when you go into a dark area. Replace any light bulbs as soon as they burn out. Set up your furniture so you have a clear path. Avoid moving your furniture around. If any of your floors are uneven, fix them. If there are any pets around you, be aware of where they are. Review your medicines with your doctor. Some medicines can make you feel dizzy. This can increase your chance of falling. Ask your doctor what other things that you can do to help prevent falls. This information is not intended to replace advice given to you by your health care provider. Make sure you discuss any questions you have with your health care provider. Document Released: 06/14/2009 Document Revised: 01/24/2016 Document Reviewed: 09/22/2014 Elsevier Interactive Patient Education  2017 [employer].

## 2021-08-13 ENCOUNTER — Telehealth: Payer: Self-pay

## 2021-08-13 NOTE — Telephone Encounter (Signed)
Called pt as a reminder to schedule mammogram. Left VM.  [facility] nurse may give results to patient if they return call to clinic, a CRM has been created.  KP

## 2021-08-14 ENCOUNTER — Ambulatory Visit (INDEPENDENT_AMBULATORY_CARE_PROVIDER_SITE_OTHER): Payer: Medicare Other | Admitting: Gastroenterology

## 2021-08-14 DIAGNOSIS — R6889 Other general symptoms and signs: Secondary | ICD-10-CM | POA: Diagnosis not present

## 2021-08-14 NOTE — Progress Notes (Signed)
Audrey Antigua, MD [address]  [place]  [place], [place] [address]  Main: [phone]  Fax: [phone]   Primary Care Physician: Glean Hess, MD   CC: weight changes  HPI: Wednesday Audrey Hayden is a 70 y.o. female here for follow up and reports she feels her weight is now stable and working with a nutritionist has helped her.  She is eating every 2 to 3 hours.  However, she feels that sometimes she wakes up at night hungry since she is eating every 2-3 hours during the day  No changes in bowel habits.  No blood in stool.  No abdominal pain.  No nausea or vomiting.  No abdominal bloating.  Colonoscopy was recently reassuring  ROS: All ROS reviewed and negative except as per HPI   Past Medical History:  Diagnosis Date   Allergy    Bronchiectasis ([place])    Celiac disease    GERD (gastroesophageal reflux disease)    Gross hematuria 02/25/2019   IBS (irritable bowel syndrome)    Osteoporosis     Past Surgical History:  Procedure Laterality Date   BREAST BIOPSY Left    neg   COLONOSCOPY  2004   COLONOSCOPY WITH PROPOFOL N/A 06/19/2021   Procedure: COLONOSCOPY WITH PROPOFOL;  Surgeon: Virgel Manifold, MD;  Location: [facility] ENDOSCOPY;  Service: Endoscopy;  Laterality: N/A;   ESOPHAGOGASTRODUODENOSCOPY (EGD) WITH PROPOFOL N/A 12/05/2020   Procedure: ESOPHAGOGASTRODUODENOSCOPY (EGD) WITH PROPOFOL;  Surgeon: Virgel Manifold, MD;  Location: [facility] ENDOSCOPY;  Service: Endoscopy;  Laterality: N/A;   TONSILLECTOMY     UPPER GI ENDOSCOPY  12/2018    Prior to Admission medications   Medication Sig Start Date End Date Taking? Authorizing Provider  Calcium-Phosphorus-Vitamin D (CITRACAL +D3 PO) Take 2 tablets by mouth 2 (two) times daily.   Yes [provider]  cetirizine (ZYRTEC) 10 MG tablet Take 10 mg by mouth daily.   Yes [provider]  Magnesium 250 MG TABS Take 1 tablet by mouth in the morning and at bedtime.   Yes [provider]  mirtazapine (REMERON) 15 MG tablet Take 15 mg by mouth at bedtime. 07/10/21  Yes [provider]  Misc Natural Products (ELDERBERRY ZINC/VIT C/IMMUNE MT) Use as directed in the mouth or throat. syrup   Yes [provider]  Multiple Vitamins-Minerals (ALIVE WOMENS 50+ PO)    Yes [provider]  Pancreatin 500 MG CAPS Take by mouth. Digestive enzymes   Yes [provider]  Probiotic Product (PROBIOTIC BLEND PO)    Yes [provider]  Quercetin 250 MG TABS Take 500 mg by mouth at bedtime.   Yes [provider]    Family History  Problem Relation Age of Onset   Hypertension Mother    CAD Father    Heart attack Father    Breast cancer Neg Hx    Colon cancer Neg Hx      Social History   Tobacco Use   Smoking status: Former    Packs/day: 1.50    Years: 5.00    Pack years: 7.50    Types: Cigarettes    Start date: 32    Quit date: 04/02/1975    Years since quitting: 46.4   Smokeless tobacco: Never   Tobacco comments:    smoking cessation materials not required  Vaping Use   Vaping Use: Never used  Substance Use Topics   Alcohol use: No    Alcohol/week: 0.0 standard drinks  Drug use: No    Allergies as of 08/14/2021 - Review Complete 08/14/2021  Allergen Reaction Noted   Eggs or egg-derived products Itching and Rash 07/21/2019   Amoxicillin Other (See Comments) 09/18/2014   Azelastine hcl Other (See Comments) 01/18/2021   Cefdinir  09/18/2014   Clarithromycin Other (See Comments) 09/18/2014   Codeine Other (See Comments) 09/18/2014   Hydromorphone hcl Other (See Comments) 09/18/2014   Hydromorphone hcl Other (See Comments) 06/10/2020   Ibuprofen Other (See Comments) 01/18/2021   Meperidine Other (See Comments) 09/18/2014   Montelukast Other (See Comments) 01/18/2021   Moxifloxacin Other (See Comments) 11/14/2015   Nsaids  09/18/2014   Omeprazole Other (See Comments) 06/19/2015   Other Other (See Comments)  09/18/2014   Prednisone Other (See Comments) 09/18/2014   Pseudoephedrine Other (See Comments) 01/18/2021   Pseudoephedrine hcl  09/18/2014   Ranitidine  06/17/5101   Salicylates Hives 58/52/7782   Aspirin Hives and Rash 09/18/2014   Dexilant [dexlansoprazole] Rash 09/12/2019   Doxycycline Other (See Comments) 09/14/2017   Montelukast sodium Palpitations 09/18/2014    Physical Examination:  Constitutional: General:   Alert,  Well-developed, well-nourished, pleasant and cooperative in NAD There were no vitals taken for this visit.  Respiratory: Normal respiratory effort  Gastrointestinal:  Soft, non-tender and non-distended without masses, hepatosplenomegaly or hernias noted.  No guarding or rebound tenderness.     Cardiac: No clubbing or edema.  No cyanosis. Normal posterior tibial pedal pulses noted.  Psych:  Alert and cooperative. Normal mood and affect.  Musculoskeletal:  Normal gait. Head normocephalic, atraumatic. Symmetrical without gross deformities. 5/5 Lower extremity strength bilaterally.  Skin: Warm. Intact without significant lesions or rashes. No jaundice.  Neck: Supple, trachea midline  Lymph: No cervical lymphadenopathy  Psych:  Alert and oriented x3, Alert and cooperative. Normal mood and affect.  Labs: CMP     Component Value Date/Time   NA 137 03/27/2021 1556   NA 137 02/25/2019 1410   NA 138 12/05/2013 0800   K 3.9 03/27/2021 1556   K 3.6 12/05/2013 0800   CL 98 03/27/2021 1556   CL 105 12/05/2013 0800   CO2 28 03/27/2021 1556   CO2 28 12/05/2013 0800   GLUCOSE 94 03/27/2021 1556   GLUCOSE 111 (H) 12/05/2013 0800   BUN 16 03/27/2021 1556   BUN 18 02/25/2019 1410   BUN 9 12/05/2013 0800   CREATININE 0.76 03/27/2021 1556   CREATININE 0.82 12/05/2013 0800   CALCIUM 9.4 03/27/2021 1556   CALCIUM 8.5 12/05/2013 0800   PROT 7.1 03/26/2021 1406   PROT 6.9 02/25/2019 1410   PROT 6.6 12/05/2013 0800   ALBUMIN 4.1 03/26/2021 1406   ALBUMIN 4.5  02/25/2019 1410   ALBUMIN 3.4 12/05/2013 0800   AST 34 03/26/2021 1406   AST 29 12/05/2013 0800   ALT 37 03/26/2021 1406   ALT 27 12/05/2013 0800   ALKPHOS 85 03/26/2021 1406   ALKPHOS 66 12/05/2013 0800   BILITOT 1.4 (H) 03/26/2021 1406   BILITOT 0.5 02/25/2019 1410   BILITOT 1.4 (H) 12/05/2013 0800   GFRNONAA >60 03/27/2021 1556   GFRNONAA >60 12/05/2013 0800   GFRAA 72 02/25/2019 1410   GFRAA >60 12/05/2013 0800   Lab Results  Component Value Date   WBC 6.6 03/27/2021   HGB 13.8 03/27/2021   HCT 41.6 03/27/2021   MCV 92.4 03/27/2021   PLT 187 03/27/2021    Imaging Studies:   Assessment and Plan:   Macaila Tahir is a 70  y.o. y/o female here for follow-up and is denying any further abdominal pain, altered bowel habits, reporting 1 formed bowel movement daily  Patient reports improvement in stability of weight after working with a nutritionist.  On previous visits her main complaint was inability to gain weight   She was 116 pounds in June 2022 and is 111 pounds today.  Her work-up has been reassuring.  Continue working with nutritionist as this is helping her  Previous ultrasound had suggested fatty liver and patient is completing hep A/B vaccination series  FibroSure level did not show fibrosis or steatosis  She is working with PCP on anxiety issues.  Continue to do so  PCP recently ordered "bowel disorder cascade "which showed a positive TTG/DGP screen.  This test does not differentiate which of the 2 tests or antibodies were positive.  Patient is not having any diarrhea, but I will order further testing to follow-up on this   Dr Audrey Hayden

## 2021-08-21 ENCOUNTER — Ambulatory Visit (INDEPENDENT_AMBULATORY_CARE_PROVIDER_SITE_OTHER): Payer: Medicare Other | Admitting: Internal Medicine

## 2021-08-21 ENCOUNTER — Encounter: Payer: Self-pay | Admitting: Internal Medicine

## 2021-08-21 ENCOUNTER — Other Ambulatory Visit: Payer: Self-pay

## 2021-08-21 VITALS — BP 122/70 | HR 68 | Ht 68.0 in | Wt 112.0 lb

## 2021-08-21 DIAGNOSIS — M810 Age-related osteoporosis without current pathological fracture: Secondary | ICD-10-CM | POA: Diagnosis not present

## 2021-08-21 DIAGNOSIS — R7303 Prediabetes: Secondary | ICD-10-CM

## 2021-08-21 DIAGNOSIS — E538 Deficiency of other specified B group vitamins: Secondary | ICD-10-CM | POA: Diagnosis not present

## 2021-08-21 DIAGNOSIS — J479 Bronchiectasis, uncomplicated: Secondary | ICD-10-CM

## 2021-08-21 NOTE — Progress Notes (Signed)
Date:  08/21/2021   Name:  Audrey Hayden   DOB:  [birth year]   MRN:  [id number]   Chief Complaint: Prediabetes  Diabetes She presents for her follow-up diabetic visit. Diabetes type: prediabetes. Her disease course has been stable. Pertinent negatives for hypoglycemia include no dizziness, headaches or nervousness/anxiousness. Pertinent negatives for diabetes include no chest pain and no fatigue.  Cough This is a new problem. The current episode started today. The problem occurs every few hours. The cough is Non-productive. Associated symptoms include nasal congestion and postnasal drip. Pertinent negatives include no chest pain, chills, fever, headaches, shortness of breath or wheezing. Associated symptoms comments: And dry mouth .  Vitamin B12 - has been deficient in the past and was taking high doses daily.  Last lab showed this to be too high.  She has cut the dose back and needs recheck. OP- taking vitamin D and last level was too high.  She has cut the dose back and needs recheck. Overall feeling much better and gaining a few pounds slowly.  Has seen GI and received Hepatitis vaccination due to fatty liver.  She will need to find a new GI as Dr. Darene Lamer is moving.  Lab Results  Component Value Date   NA 137 03/27/2021   K 3.9 03/27/2021   CO2 28 03/27/2021   GLUCOSE 94 03/27/2021   BUN 16 03/27/2021   CREATININE 0.76 03/27/2021   CALCIUM 9.4 03/27/2021   GFRNONAA >60 03/27/2021   Lab Results  Component Value Date   CHOL 189 03/20/2021   TRIG 60 03/20/2021   Lab Results  Component Value Date   TSH 2.130 11/19/2017   Lab Results  Component Value Date   HGBA1C 5.7 (H) 02/15/2021   Lab Results  Component Value Date   WBC 6.6 03/27/2021   HGB 13.8 03/27/2021   HCT 41.6 03/27/2021   MCV 92.4 03/27/2021   PLT 187 03/27/2021   Lab Results  Component Value Date   ALT 37 03/26/2021   AST 34 03/26/2021   ALKPHOS 85 03/26/2021   BILITOT 1.4 (H) 03/26/2021   Lab  Results  Component Value Date   VD25OH 117.0 (H) 02/15/2021   Lab Results  Component Value Date   VITAMINB12 1,450 (H) 02/15/2021     Review of Systems  Constitutional:  Negative for chills, diaphoresis, fatigue, fever and unexpected weight change.  HENT:  Positive for postnasal drip.   Respiratory:  Positive for cough. Negative for chest tightness, shortness of breath and wheezing.   Cardiovascular:  Negative for chest pain and palpitations.  Gastrointestinal:  Negative for abdominal distention, abdominal pain and blood in stool.  Musculoskeletal:  Negative for arthralgias, gait problem and joint swelling.  Neurological:  Negative for dizziness and headaches.  Psychiatric/Behavioral:  Negative for dysphoric mood and sleep disturbance. The patient is not nervous/anxious.    Patient Active Problem List   Diagnosis Date Noted   Loose stools    Loss of weight    Thyroid nodule 05/03/2021   Allergic rhinitis 04/11/2021   Adverse reaction to food 04/11/2021   Esophageal dysphagia    Schatzki's ring    Gastric erythema    Bronchiectasis ([place]) 08/10/2020   Asymptomatic spider veins of both lower extremities 02/25/2019   Chronic left-sided thoracic back pain 12/09/2017   Anxiety 12/09/2017   Environmental and seasonal allergies 06/19/2015   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/19/2015   Osteoporosis 06/19/2015   Abnormal Pap smear of  cervix 06/19/2015   Elevated HDL 06/19/2015   History of gastritis 11/08/2014   Cough, persistent 08/14/2014    Allergies  Allergen Reactions   Eggs Or Egg-Derived Products Itching and Rash   Amoxicillin Other (See Comments)    Tingling around the mouth   Azelastine Hcl Other (See Comments)   Cefdinir     Other reaction(s): Other (See Comments) Bright red face and ankle swelling: possible reaction to the medicine.    Clarithromycin Other (See Comments)    Causes her to faint.   Codeine Other (See Comments)    Causes her GI upset.    Hydromorphone Hcl Other (See Comments)    Unsure of reaction   Hydromorphone Hcl Other (See Comments)   Ibuprofen Other (See Comments)   Meperidine Other (See Comments)    Unsure of reaction   Montelukast Other (See Comments)   Moxifloxacin Other (See Comments)    Dizziness    Nsaids     Other reaction(s): Unknown   Omeprazole Other (See Comments)    Ears ringing with more than 20mg /day   Other Other (See Comments)    All profins cause her to faint. All the mycins cause her to faint.   Prednisone Other (See Comments)    Bright red face and ankle swelling: possible reaction to the medicine.   Pseudoephedrine Other (See Comments)   Pseudoephedrine Hcl     Other reaction(s): Syncope   Ranitidine     Other reaction(s): Other (See Comments) SE noted with high dosage Dk urine and stools, HA   Salicylates Hives   Aspirin Hives and Rash   Dexilant [Dexlansoprazole] Rash    Peeling skin   Doxycycline Other (See Comments)    Headache. "Pressure on the brain''   Montelukast Sodium Palpitations    Past Surgical History:  Procedure Laterality Date   BREAST BIOPSY Left    neg   COLONOSCOPY  2004   COLONOSCOPY WITH PROPOFOL N/A 06/19/2021   Procedure: COLONOSCOPY WITH PROPOFOL;  Surgeon: Virgel Manifold, MD;  Location: [facility] ENDOSCOPY;  Service: Endoscopy;  Laterality: N/A;   ESOPHAGOGASTRODUODENOSCOPY (EGD) WITH PROPOFOL N/A 12/05/2020   Procedure: ESOPHAGOGASTRODUODENOSCOPY (EGD) WITH PROPOFOL;  Surgeon: Virgel Manifold, MD;  Location: [facility] ENDOSCOPY;  Service: Endoscopy;  Laterality: N/A;   TONSILLECTOMY     UPPER GI ENDOSCOPY  12/2018    Social History   Tobacco Use   Smoking status: Former    Packs/day: 1.50    Years: 5.00    Pack years: 7.50    Types: Cigarettes    Start date: 50    Quit date: 04/02/1975    Years since quitting: 46.4   Smokeless tobacco: Never   Tobacco comments:    smoking cessation materials not required  Vaping Use   Vaping Use: Never  used  Substance Use Topics   Alcohol use: No    Alcohol/week: 0.0 standard drinks   Drug use: No     Medication list has been reviewed and updated.  Current Meds  Medication Sig   Calcium-Phosphorus-Vitamin D (CITRACAL +D3 PO) Take 2 tablets by mouth 2 (two) times daily.   cetirizine (ZYRTEC) 10 MG tablet Take 10 mg by mouth daily.   Magnesium 250 MG TABS Take 1 tablet by mouth in the morning and at bedtime.   mirtazapine (REMERON) 15 MG tablet Take 15 mg by mouth at bedtime.   Misc Natural Products (ELDERBERRY ZINC/VIT C/IMMUNE MT) Use as directed in the mouth or throat. syrup  Multiple Vitamins-Minerals (ALIVE WOMENS 50+ PO)    Pancreatin 500 MG CAPS Take by mouth. Digestive enzymes   Probiotic Product (PROBIOTIC BLEND PO)    Quercetin 250 MG TABS Take 500 mg by mouth at bedtime.    PHQ 2/9 Scores 08/21/2021 07/15/2021 05/28/2021 05/03/2021  PHQ - 2 Score 0 0 0 0  PHQ- 9 Score 1 1 4  0    GAD 7 : Generalized Anxiety Score 08/21/2021 05/28/2021 05/03/2021 03/07/2021  Nervous, Anxious, on Edge 0 1 0 1  Control/stop worrying 0 0 0 1  Worry too much - different things 0 1 0 0  Trouble relaxing 1 1 0 1  Restless 0 0 0 0  Easily annoyed or irritable 0 0 0 0  Afraid - awful might happen 0 1 0 1  Total GAD 7 Score 1 4 0 4  Anxiety Difficulty Not difficult at all Not difficult at all - Somewhat difficult    BP Readings from Last 3 Encounters:  08/21/21 122/70  07/15/21 110/70  06/19/21 114/66    Physical Exam Vitals and nursing note reviewed.  Constitutional:      General: She is not in acute distress.    Appearance: Normal appearance. She is well-developed.  HENT:     Head: Normocephalic and atraumatic.  Neck:     Thyroid: No thyroid mass, thyromegaly or thyroid tenderness.  Cardiovascular:     Rate and Rhythm: Normal rate and regular rhythm.     Pulses: Normal pulses.  Pulmonary:     Effort: Pulmonary effort is normal. No respiratory distress.     Breath sounds: Normal  breath sounds and air entry. No decreased breath sounds, wheezing or rhonchi.  Musculoskeletal:     Cervical back: Normal range of motion.     Right lower leg: No edema.     Left lower leg: No edema.  Lymphadenopathy:     Cervical: No cervical adenopathy.     Right cervical: No superficial cervical adenopathy. Skin:    General: Skin is warm and dry.     Capillary Refill: Capillary refill takes less than 2 seconds.     Findings: No rash.  Neurological:     General: No focal deficit present.     Mental Status: She is alert and oriented to person, place, and time.  Psychiatric:        Mood and Affect: Mood normal.        Behavior: Behavior normal.    Wt Readings from Last 3 Encounters:  08/21/21 112 lb (50.8 kg)  07/15/21 111 lb 6.4 oz (50.5 kg)  06/28/21 110 lb (49.9 kg)    BP 122/70    Pulse 68    Ht 5\' 8"  (1.727 m)    Wt 112 lb (50.8 kg)    SpO2 98%    BMI 17.03 kg/m   Assessment and Plan: 1. Prediabetes Continue healthy diet and low carb choices - Hemoglobin A1c  2. Bronchiectasis without complication (HCC) Mild cough started today - home Covid negative. Continue Mucinex and fluids No evidence of bacterial or viral infection at this time but may need to retest for Covid if sx worsen.  3. Age-related osteoporosis without current pathological fracture Now on lower dose vitamin D - VITAMIN D 25 Hydroxy (Vit-D Deficiency, Fractures)  4. Deficiency of other specified B group vitamins Vitamin B12 excess noted last visit; dose reduced - Vitamin B12   Partially dictated using Editor, commissioning. Any errors are unintentional.  Halina Maidens, MD  [facility]  08/21/2021

## 2021-08-22 ENCOUNTER — Encounter: Payer: Self-pay | Admitting: Dietician

## 2021-08-22 ENCOUNTER — Encounter: Payer: Medicare Other | Attending: Gastroenterology | Admitting: Dietician

## 2021-08-22 VITALS — Ht 68.0 in | Wt 115.1 lb

## 2021-08-22 DIAGNOSIS — R634 Abnormal weight loss: Secondary | ICD-10-CM | POA: Insufficient documentation

## 2021-08-22 LAB — HEMOGLOBIN A1C
Est. average glucose Bld gHb Est-mCnc: 114 mg/dL
Hgb A1c MFr Bld: 5.6 % (ref 4.8–5.6)

## 2021-08-22 LAB — VITAMIN D 25 HYDROXY (VIT D DEFICIENCY, FRACTURES): Vit D, 25-Hydroxy: 127 ng/mL — ABNORMAL HIGH (ref 30.0–100.0)

## 2021-08-22 LAB — VITAMIN B12: Vitamin B-12: 1605 pg/mL — ABNORMAL HIGH (ref 232–1245)

## 2021-08-22 NOTE — Patient Instructions (Signed)
Try soft food alternatives to increase variety -- rice pudding, butternut squash soup, lowfat cream soup with added chicken and vegetables; check for other ideas by searching for healthy soft foods online.

## 2021-08-22 NOTE — Progress Notes (Signed)
Medical Nutrition Therapy: Visit start time: 3094  end time: 1540  Assessment:  Diagnosis: abnormal weight loss Medical history changes: no changes Psychosocial issues/ stress concerns: high stress level; patient reports working on stress management  Current weight: 115.1lbs Height: 5'8" BMI: 17.5 Medications, supplement changes: no changes  Progress and evaluation:  Generally avoiding milk due to excess mucus production, breakfast drinks and protein shakes also cause excess mucus She reports improvement in GI symptoms, feels her gut is likely less inflamed/ irritated but does continue to have some cough with mucus at times after eating. She feels soft foods are tolerated best, has trouble with hard textured foods; but states she is eating a very limited number of foods.    Physical activity:  Dietary Intake:  Usual eating pattern includes 3 meals and 3 snacks per day. Dining out frequency:   Breakfast: baked chicken or fish or boiled egg + rice based pancake or GF oats Snack: GF bread with honey or molasses; boiled egg(s); canned peaches or pears with cashew butter Lunch: baked fish or chicken + potatoes/ green beans or other soft cooked veg; salads not working well/ soup, GF corn muffin Snack: same as am Supper: same as lunch Snack: same as am Beverages: water  Nutrition Care Education: Topics covered:  Weight control: reviewed progress since previous visit, reviewed importance of/ role of protein sources with meals Advanced nutrition:  dicussed soft, gluten free food options for meals or snacks   Nutritional Diagnosis:  [place]-1.4 Altered GI function As related to GERD, food intolerances.  As evidenced by frequent excess mucus production after eating, history of loose stools, diarrhea. -3.1 Underweight As related to history of GI distress.  As evidenced by patient with current BMI of 17.5 experiencing gradual weight gain on low fiber, gluten free diet.  Intervention:   Instruction and discussion as noted above. Patient voices ongoing improvement in GI symptoms and weight.  She will continue with her current dietary pattern and implement/ search for other tolerable soft food options.  No additional follow up scheduled at this time, patient will schedule later if needed.  Education Materials given:    Learner/ who was taught:  Patient    Level of understanding: Verbalizes/ demonstrates competency  Demonstrated degree of understanding via:   Teach back Learning barriers: None  Willingness to learn/ readiness for change: Eager, change in progress   Monitoring and Evaluation:  Dietary intake, exercise, GI symptoms and health, and body weight      follow up: prn

## 2021-08-23 ENCOUNTER — Telehealth: Payer: Self-pay

## 2021-08-23 NOTE — Telephone Encounter (Signed)
-----   Message from Virgel Manifold, MD sent at 08/14/2021  5:55 PM EST ----- Audrey Hayden, I see that the pcp ordered some recent blood work that included a celiac disease screen that was positive.  I recommend further blood work for this and I have ordered this to be done.  Please advise to get this done so we can confirm if she has celiac disease or not given the positive initial screen

## 2021-08-23 NOTE — Telephone Encounter (Signed)
Left detailed msg on VM per HIPAA advising pt to have additional labs drawn

## 2021-09-03 ENCOUNTER — Ambulatory Visit (INDEPENDENT_AMBULATORY_CARE_PROVIDER_SITE_OTHER): Payer: Medicare Other | Admitting: Dermatology

## 2021-09-03 ENCOUNTER — Other Ambulatory Visit: Payer: Self-pay

## 2021-09-03 DIAGNOSIS — D225 Melanocytic nevi of trunk: Secondary | ICD-10-CM | POA: Diagnosis not present

## 2021-09-03 DIAGNOSIS — L820 Inflamed seborrheic keratosis: Secondary | ICD-10-CM

## 2021-09-03 DIAGNOSIS — L578 Other skin changes due to chronic exposure to nonionizing radiation: Secondary | ICD-10-CM | POA: Diagnosis not present

## 2021-09-03 DIAGNOSIS — C439 Malignant melanoma of skin, unspecified: Secondary | ICD-10-CM

## 2021-09-03 DIAGNOSIS — C4359 Malignant melanoma of other part of trunk: Secondary | ICD-10-CM | POA: Diagnosis not present

## 2021-09-03 DIAGNOSIS — L821 Other seborrheic keratosis: Secondary | ICD-10-CM

## 2021-09-03 DIAGNOSIS — D1801 Hemangioma of skin and subcutaneous tissue: Secondary | ICD-10-CM

## 2021-09-03 DIAGNOSIS — D229 Melanocytic nevi, unspecified: Secondary | ICD-10-CM

## 2021-09-03 DIAGNOSIS — L814 Other melanin hyperpigmentation: Secondary | ICD-10-CM | POA: Diagnosis not present

## 2021-09-03 DIAGNOSIS — D485 Neoplasm of uncertain behavior of skin: Secondary | ICD-10-CM

## 2021-09-03 HISTORY — DX: Malignant melanoma of skin, unspecified: C43.9

## 2021-09-03 NOTE — Patient Instructions (Addendum)
Biopsy Wound Care Instructions  Leave the original bandage on for 24 hours if possible.  If the bandage becomes soaked or soiled before that time, it is OK to remove it and examine the wound.  A small amount of post-operative bleeding is normal.  If excessive bleeding occurs, remove the bandage, place gauze over the site and apply continuous pressure (no peeking) over the area for 30 minutes. If this does not work, please call our clinic as soon as possible or page your doctor if it is after hours.   Once a day, cleanse the wound with soap and water. It is fine to shower. If a thick crust develops you may use a Q-tip dipped into dilute hydrogen peroxide (mix 1:1 with water) to dissolve it.  Hydrogen peroxide can slow the healing process, so use it only as needed.    After washing, apply petroleum jelly (Vaseline) or an antibiotic ointment if your doctor prescribed one for you, followed by a bandage.    For best healing, the wound should be covered with a layer of ointment at all times. If you are not able to keep the area covered with a bandage to hold the ointment in place, this may mean re-applying the ointment several times a day.  Continue this wound care until the wound has healed and is no longer open.   Itching and mild discomfort is normal during the healing process. However, if you develop pain or severe itching, please call our office.   If you have any discomfort, you can take Tylenol (acetaminophen) or ibuprofen as directed on the bottle. (Please do not take these if you have an allergy to them or cannot take them for another reason).  Some redness, tenderness and white or yellow material in the wound is normal healing.  If the area becomes very sore and red, or develops a thick yellow-green material (pus), it may be infected; please notify us.    If you have stitches, return to clinic as directed to have the stitches removed. You will continue wound care for 2-3 days after the stitches  are removed.   Wound healing continues for up to one year following surgery. It is not unusual to experience pain in the scar from time to time during the interval.  If the pain becomes severe or the scar thickens, you should notify the office.    A slight amount of redness in a scar is expected for the first six months.  After six months, the redness will fade and the scar will soften and fade.  The color difference becomes less noticeable with time.  If there are any problems, return for a post-op surgery check at your earliest convenience.  To improve the appearance of the scar, you can use silicone scar gel, cream, or sheets (such as Mederma or Serica) every night for up to one year. These are available over the counter (without a prescription).  Please call our office at [phone] for any questions or concerns.   Seborrheic Keratosis  What causes seborrheic keratoses? Seborrheic keratoses are harmless, common skin growths that first appear during adult life.  As time goes by, more growths appear.  Some people may develop a large number of them.  Seborrheic keratoses appear on both covered and uncovered body parts.  They are not caused by sunlight.  The tendency to develop seborrheic keratoses can be inherited.  They vary in color from skin-colored to gray, brown, or even black.  They can be  either smooth or have a rough, warty surface.   Seborrheic keratoses are superficial and look as if they were stuck on the skin.  Under the microscope this type of keratosis looks like layers upon layers of skin.  That is why at times the top layer may seem to fall off, but the rest of the growth remains and re-grows.    Treatment Seborrheic keratoses do not need to be treated, but can easily be removed in the office.  Seborrheic keratoses often cause symptoms when they rub on clothing or jewelry.  Lesions can be in the way of shaving.  If they become inflamed, they can cause itching, soreness, or  burning.  Removal of a seborrheic keratosis can be accomplished by freezing, burning, or surgery. If any spot bleeds, scabs, or grows rapidly, please return to have it checked, as these can be an indication of a skin cancer.  Cryotherapy Aftercare  Wash gently with soap and water everyday.   Apply Vaseline and Band-Aid daily until healed.     Melanoma ABCDEs  Melanoma is the most dangerous type of skin cancer, and is the leading cause of death from skin disease.  You are more likely to develop melanoma if you: Have light-colored skin, light-colored eyes, or red or blond hair Spend a lot of time in the sun Tan regularly, either outdoors or in a tanning bed Have had blistering sunburns, especially during childhood Have a close family member who has had a melanoma Have atypical moles or large birthmarks  Early detection of melanoma is key since treatment is typically straightforward and cure rates are extremely high if we catch it early.   The first sign of melanoma is often a change in a mole or a new dark spot.  The ABCDE system is a way of remembering the signs of melanoma.  A for asymmetry:  The two halves do not match. B for border:  The edges of the growth are irregular. C for color:  A mixture of colors are present instead of an even brown color. D for diameter:  Melanomas are usually (but not always) greater than 57mm - the size of a pencil eraser. E for evolution:  The spot keeps changing in size, shape, and color.  Please check your skin once per month between visits. You can use a small mirror in front and a large mirror behind you to keep an eye on the back side or your body.   If you see any new or changing lesions before your next follow-up, please call to schedule a visit.  Please continue daily skin protection including broad spectrum sunscreen SPF 30+ to sun-exposed areas, reapplying every 2 hours as needed when you're outdoors.   Staying in the shade or wearing long  sleeves, sun glasses (UVA+UVB protection) and wide brim hats (4-inch brim around the entire circumference of the hat) are also recommended for sun protection.    If You Need Anything After Your Visit  If you have any questions or concerns for your doctor, please call our main line at [phone] and press option 4 to reach your doctor's medical assistant. If no one answers, please leave a voicemail as directed and we will return your call as soon as possible. Messages left after 4 pm will be answered the following business day.   You may also send [place] a message via [place]. We typically respond to MyChart messages within 1-2 business days.  For prescription refills, please ask your pharmacy to  contact our office. Our fax number is [phone].  If you have an urgent issue when the clinic is closed that cannot wait until the next business day, you can page your doctor at the number below.    Please note that while we do our best to be available for urgent issues outside of office hours, we are not available 24/7.   If you have an urgent issue and are unable to reach [place], you may choose to seek medical care at your doctor's office, retail clinic, urgent care center, or emergency room.  If you have a medical emergency, please immediately call 911 or go to the emergency department.  Pager Numbers  - Dr. Nehemiah Massed: [phone]  - Dr. Laurence Ferrari: [phone]  - Dr. Nicole Kindred: [phone]  In the event of inclement weather, please call our main line at [phone] for an update on the status of any delays or closures.  Dermatology Medication Tips: Please keep the boxes that topical medications come in in order to help keep track of the instructions about where and how to use these. Pharmacies typically print the medication instructions only on the boxes and not directly on the medication tubes.   If your medication is too expensive, please contact our office at [phone] option 4 or send [place] a  message through [place].   We are unable to tell what your co-pay for medications will be in advance as this is different depending on your insurance coverage. However, we may be able to find a substitute medication at lower cost or fill out paperwork to get insurance to cover a needed medication.   If a prior authorization is required to get your medication covered by your insurance company, please allow [place] 1-2 business days to complete this process.  Drug prices often vary depending on where the prescription is filled and some pharmacies may offer cheaper prices.  The website www.goodrx.com contains coupons for medications through different pharmacies. The prices here do not account for what the cost may be with help from insurance (it may be cheaper with your insurance), but the website can give you the price if you did not use any insurance.  - You can print the associated coupon and take it with your prescription to the pharmacy.  - You may also stop by our office during regular business hours and pick up a GoodRx coupon card.  - If you need your prescription sent electronically to a different pharmacy, notify our office through [facility] or by phone at [phone] option 4.     Si Usted Necesita Algo Despus de Su Visita  Tambin puede enviarnos un mensaje a travs de Pharmacist, community. Por lo general respondemos a los mensajes de MyChart en el transcurso de 1 a 2 das hbiles.  Para renovar recetas, por favor pida a su farmacia que se ponga en contacto con nuestra oficina. Harland Dingwall de fax es [place] [phone].  Si tiene un asunto urgente cuando la clnica est cerrada y que no puede esperar hasta el siguiente da hbil, puede llamar/localizar a su doctor(a) al nmero que aparece a continuacin.   Por favor, tenga en cuenta que aunque hacemos todo lo posible para estar disponibles para asuntos urgentes fuera del horario de [place], no estamos disponibles las 24 horas del da, los 7  das de la [place].   Si tiene un problema urgente y no puede comunicarse con nosotros, puede optar por buscar atencin mdica  en el consultorio de su doctor(a), en una clnica  privada, en un centro de atencin urgente o en una sala de emergencias.  Si tiene Engineering geologist, por favor llame inmediatamente al 911 o vaya a la sala de emergencias.  Nmeros de bper  - Dr. Nehemiah Massed: [phone]  - Dra. Moye: [phone]  - Dra. Nicole Kindred: [phone]  En caso de inclemencias del [place], por favor llame a Johnsie Kindred principal al [phone] para una actualizacin sobre el [place] de cualquier retraso o cierre.  Consejos para la medicacin en dermatologa: Por favor, guarde las cajas en las que vienen los medicamentos de uso tpico para ayudarle a seguir las instrucciones sobre dnde y cmo usarlos. Las farmacias generalmente imprimen las instrucciones del medicamento slo en las cajas y no directamente en los tubos del [place].   Si su medicamento es muy caro, por favor, pngase en contacto con Zigmund Daniel llamando al [phone] y presione la opcin 4 o envenos un mensaje a travs de Pharmacist, community.   No podemos decirle cul ser su copago por los medicamentos por adelantado ya que esto es diferente dependiendo de la cobertura de su seguro. Sin embargo, es posible que podamos encontrar un medicamento sustituto a Electrical engineer un formulario para que el seguro cubra el medicamento que se considera necesario.   Si se requiere una autorizacin previa para que su compaa de seguros [place] su medicamento, por favor permtanos de 1 a 2 das hbiles para completar este proceso.  Los precios de los medicamentos varan con frecuencia dependiendo del Environmental consultant de dnde se surte la receta y alguna farmacias pueden ofrecer precios ms baratos.  El sitio web www.goodrx.com tiene cupones para medicamentos de Airline pilot. Los precios aqu no tienen en cuenta lo que podra costar con  la ayuda del seguro (puede ser ms barato con su seguro), pero el sitio web puede darle el precio si no utiliz Research scientist (physical sciences).  - Puede imprimir el cupn correspondiente y llevarlo con su receta a la farmacia.  - Tambin puede pasar por nuestra oficina durante el horario de atencin regular y Charity fundraiser una tarjeta de cupones de GoodRx.  - Si necesita que su receta se enve electrnicamente a una farmacia diferente, informe a nuestra oficina a travs de MyChart de [place] o por telfono llamando al [phone] y presione la opcin 4.

## 2021-09-03 NOTE — Progress Notes (Signed)
Follow-Up Visit   Subjective  Audrey Hayden is a 71 y.o. female who presents for the following: New Patient (Initial Visit) (New patient here today concerning several spots at chest, back, and right side. She reports spot at right side has grown in size. Patient denies any personal or family history of skin cancer. She reports she has spent a lot of time in sun. ).  The growth on her chest has grown and is itchy/irritated.   The following portions of the chart were reviewed this encounter and updated as appropriate:      Review of Systems: No other skin or systemic complaints except as noted in HPI or Assessment and Plan.   Objective  Well appearing patient in no apparent distress; mood and affect are within normal limits.  A focused examination was performed including chest and back. Relevant physical exam findings are noted in the Assessment and Plan.  right scapula 1.7 x 1.3 mm irregular pigmented brown patch          right spinal lower back 9 mm speckled brown macule darker inferior  lower sternum intermammary 5 mm speckled brown macule        mid sternum x 1 Erythematous stuck-on, waxy papule   Assessment & Plan  Neoplasm of uncertain behavior of skin right scapula  Skin / nail biopsy Type of biopsy: tangential   Informed consent: discussed and consent obtained   Timeout: patient name, date of birth, surgical site, and procedure verified   Patient was prepped and draped in usual sterile fashion: Area prepped with isopropyl alcohol. Anesthesia: the lesion was anesthetized in a standard fashion   Anesthetic:  1% lidocaine w/ epinephrine 1-100,000 buffered w/ 8.4% NaHCO3 Instrument used: flexible razor blade   Hemostasis achieved with: aluminum chloride   Outcome: patient tolerated procedure well   Post-procedure details: wound care instructions given   Additional details:  Mupirocin and a bandage applied  Specimen 1 - Surgical  pathology Differential Diagnosis: Sk vs lentigo r/o atypia   Check Margins: No  Sk vs lentigo r/o atypia   Nevus (2) lower sternum intermammary; right spinal lower back  Nevus vs sk   Recheck on f/up  Benign-appearing.  Observation.  Call clinic for new or changing lesions.  Recommend daily use of broad spectrum spf 30+ sunscreen to sun-exposed areas.    Inflamed seborrheic keratosis mid sternum x 1  Destruction of lesion - mid sternum x 1  Destruction method: cryotherapy   Informed consent: discussed and consent obtained   Lesion destroyed using liquid nitrogen: Yes   Region frozen until ice ball extended beyond lesion: Yes   Outcome: patient tolerated procedure well with no complications   Post-procedure details: wound care instructions given   Additional details:  Prior to procedure, discussed risks of blister formation, small wound, skin dyspigmentation, or rare scar following cryotherapy. Recommend Vaseline ointment to treated areas while healing.    Seborrheic Keratoses - Stuck-on, waxy, tan-brown papules and/or plaques  - Benign-appearing - Discussed benign etiology and prognosis. - Observe - Call for any changes  Lentigines - Scattered tan macules - Due to sun exposure - Benign-appering, observe - Recommend daily broad spectrum sunscreen SPF 30+ to sun-exposed areas, reapply every 2 hours as needed. - Call for any changes  Hemangiomas - Red papules - Discussed benign nature - Observe - Call for any changes  Melanocytic Nevi - Tan-brown and/or pink-flesh-colored symmetric macules and papules - Benign appearing on exam today - Observation -  Call clinic for new or changing moles - Recommend daily use of broad spectrum spf 30+ sunscreen to sun-exposed areas.   Actinic Damage - chronic, secondary to cumulative UV radiation exposure/sun exposure over time - diffuse scaly erythematous macules with underlying dyspigmentation - Recommend daily broad  spectrum sunscreen SPF 30+ to sun-exposed areas, reapply every 2 hours as needed.  - Recommend staying in the shade or wearing long sleeves, sun glasses (UVA+UVB protection) and wide brim hats (4-inch brim around the entire circumference of the hat). - Call for new or changing lesions.  Return pending biopsy. I, Ruthell Rummage, CMA, am acting as scribe for Brendolyn Patty, MD.  Documentation: I have reviewed the above documentation for accuracy and completeness, and I agree with the above.  Brendolyn Patty MD

## 2021-09-06 ENCOUNTER — Encounter: Payer: Self-pay | Admitting: Internal Medicine

## 2021-09-06 ENCOUNTER — Telehealth (INDEPENDENT_AMBULATORY_CARE_PROVIDER_SITE_OTHER): Payer: Medicare Other | Admitting: Internal Medicine

## 2021-09-06 ENCOUNTER — Other Ambulatory Visit: Payer: Self-pay

## 2021-09-06 ENCOUNTER — Telehealth: Payer: Self-pay

## 2021-09-06 VITALS — Ht 68.0 in

## 2021-09-06 DIAGNOSIS — U071 COVID-19: Secondary | ICD-10-CM

## 2021-09-06 MED ORDER — MOLNUPIRAVIR EUA 200MG CAPSULE: 4.0000 | ORAL_CAPSULE | Freq: Two times a day (BID) | ORAL | 0 refills | Status: AC

## 2021-09-06 NOTE — Patient Instructions (Addendum)
Take Tylenol 650 - 1000 mg every 6-8 hours for fever, body aches and headache. Drink plenty of fluids with electrolytes. Monitor for fever that does not decrease and/or shortness of breath that worsens or is present at rest. Quarantine for 5 days.  

## 2021-09-06 NOTE — Telephone Encounter (Signed)

## 2021-09-06 NOTE — Progress Notes (Signed)
Date:  09/06/2021   Name:  Audrey Hayden   DOB:  10-21-1950   MRN:  [id number]  This encounter was conducted via video encounter due to the need for social distancing in light of the Covid-19 pandemic.  The patient was correctly identified.  I advised that I am conducting the visit from a secure room in my office at [facility] clinic.  The patient is located at home. The limitations of this form of encounter were discussed with the patient and he/she agreed to proceed.  Some vital signs will be absent.  Chief Complaint: Covid Positive (Cough, Congestion, sinus pressure, teeth pain, tested positve today home test )  URI  This is a new problem. Episode onset: 2 days. The problem has been gradually worsening. There has been no fever. Associated symptoms include congestion, coughing (with clear phlegm), headaches, joint swelling (right hand swollen), rhinorrhea, sinus pain and wheezing. Pertinent negatives include no chest pain or sore throat. She has tried nothing for the symptoms.  Husband has been sick with similar symptoms for 5 days.  He tested negative 5 day ago. She tested positive for Covid this am.  Her husband is also positive today.  Lab Results  Component Value Date   NA 137 03/27/2021   K 3.9 03/27/2021   CO2 28 03/27/2021   GLUCOSE 94 03/27/2021   BUN 16 03/27/2021   CREATININE 0.76 03/27/2021   CALCIUM 9.4 03/27/2021   GFRNONAA >60 03/27/2021   Lab Results  Component Value Date   CHOL 189 03/20/2021   TRIG 60 03/20/2021   Lab Results  Component Value Date   TSH 2.130 11/19/2017   Lab Results  Component Value Date   HGBA1C 5.6 08/21/2021   Lab Results  Component Value Date   WBC 6.6 03/27/2021   HGB 13.8 03/27/2021   HCT 41.6 03/27/2021   MCV 92.4 03/27/2021   PLT 187 03/27/2021   Lab Results  Component Value Date   ALT 37 03/26/2021   AST 34 03/26/2021   ALKPHOS 85 03/26/2021   BILITOT 1.4 (H) 03/26/2021   Lab Results  Component Value Date    VD25OH 127.0 (H) 08/21/2021     Review of Systems  Constitutional:  Positive for fatigue. Negative for chills and fever.  HENT:  Positive for congestion, rhinorrhea and sinus pain. Negative for sore throat.   Respiratory:  Positive for cough (with clear phlegm) and wheezing.   Cardiovascular:  Negative for chest pain and palpitations.  Neurological:  Positive for headaches.   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    Clarithromycin Other (See Comments)    Causes her to faint.   Codeine Other (See Comments)    Causes her GI upset.   Hydromorphone Hcl Other (See Comments)    Unsure of reaction   Hydromorphone Hcl Other (See Comments)   Ibuprofen Other (See Comments)   Meperidine Other (See Comments)    Unsure of reaction   Montelukast Other (See Comments)   Moxifloxacin Other (See Comments)    Dizziness    Nsaids     Other reaction(s): Unknown   Omeprazole Other (See Comments)    Ears ringing with more than 20mg /day   Other  Other (See Comments)    All profins cause her to faint. All the mycins cause her to faint.   Prednisone Other (See Comments)    Bright red face and ankle swelling: possible reaction to the medicine.   Pseudoephedrine Other (See Comments)   Pseudoephedrine Hcl     Other reaction(s): Syncope   Ranitidine     Other reaction(s): Other (See Comments) SE noted with high dosage Dk urine and stools, HA   Salicylates Hives   Aspirin Hives and Rash   Dexilant [Dexlansoprazole] Rash    Peeling skin   Doxycycline Other (See Comments)    Headache. "Pressure on the brain''   Montelukast Sodium Palpitations    Past Surgical History:  Procedure Laterality Date   BREAST BIOPSY Left    neg   COLONOSCOPY  2004   COLONOSCOPY WITH PROPOFOL N/A 06/19/2021   Procedure: COLONOSCOPY WITH PROPOFOL;  Surgeon: Virgel Manifold, MD;  Location: [facility] ENDOSCOPY;  Service: Endoscopy;  Laterality: N/A;   ESOPHAGOGASTRODUODENOSCOPY (EGD) WITH PROPOFOL N/A 12/05/2020   Procedure: ESOPHAGOGASTRODUODENOSCOPY (EGD) WITH PROPOFOL;  Surgeon: Virgel Manifold, MD;  Location: [facility] ENDOSCOPY;  Service: Endoscopy;  Laterality: N/A;   TONSILLECTOMY     UPPER GI ENDOSCOPY  12/2018    Social History   Tobacco Use   Smoking status: Former    Packs/day: 1.50    Years: 5.00    Pack years: 7.50    Types: Cigarettes    Start date: 42    Quit date: 04/02/1975    Years since quitting: 46.4   Smokeless tobacco: Never   Tobacco comments:    smoking cessation materials not required  Vaping Use   Vaping Use: Never used  Substance Use Topics   Alcohol use: No    Alcohol/week: 0.0 standard drinks   Drug use: No     Medication list has been reviewed and updated.  Current Meds  Medication Sig   Multiple Vitamins-Minerals (ALIVE WOMENS 50+ PO)    Pancreatin 500 MG CAPS Take by mouth. Digestive enzymes   Probiotic Product (PROBIOTIC BLEND PO) in the morning, at noon, and at bedtime.    PHQ 2/9 Scores 09/06/2021  08/21/2021 07/15/2021 05/28/2021  PHQ - 2 Score 0 0 0 0  PHQ- 9 Score 3 1 1 4     GAD 7 : Generalized Anxiety Score 09/06/2021 08/21/2021 05/28/2021 05/03/2021  Nervous, Anxious, on Edge 0 0 1 0  Control/stop worrying 0 0 0 0  Worry too much - different things 0 0 1 0  Trouble relaxing 1 1 1  0  Restless 0 0 0 0  Easily annoyed or irritable 0 0 0 0  Afraid - awful might happen 0 0 1 0  Total GAD 7 Score 1 1  4 0  Anxiety Difficulty - Not difficult at all Not difficult at all -    BP Readings from Last 3 Encounters:  08/21/21 122/70  07/15/21 110/70  06/19/21 114/66    Physical Exam Constitutional:      Appearance: She is ill-appearing.  Pulmonary:     Effort: Pulmonary effort is normal.  Neurological:     Mental Status: She is alert.  Psychiatric:        Mood and Affect: Mood normal.    Wt Readings from Last 3 Encounters:  08/22/21 115 lb 1.6 oz (52.2 kg)  08/21/21 112 lb (50.8 kg)  07/15/21 111 lb 6.4 oz (50.5 kg)    Ht 5\' 8"  (1.727 m)    BMI 17.50 kg/m   Assessment and Plan: 1. COVID-19 virus infection Take Tylenol 650 - 1000 mg every 6-8 hours for fever, body aches and headache. Drink plenty of fluids with electrolytes. Monitor for fever that does not decrease and/or shortness of breath that worsens or is present at rest. Quarantine for 5 days. - molnupiravir EUA (LAGEVRIO) 200 mg CAPS capsule; Take 4 capsules (800 mg total) by mouth 2 (two) times daily for 5 days.  Dispense: 40 capsule; Refill: 0  I spent 12 minutes on this encounter, 100% via [place]. Partially dictated using Editor, commissioning. Any errors are unintentional.  Halina Maidens, MD [facility]  09/06/2021

## 2021-09-09 ENCOUNTER — Telehealth: Payer: Self-pay

## 2021-09-09 NOTE — Telephone Encounter (Signed)
-----   Message from Brendolyn Patty, MD sent at 09/09/2021  9:10 AM EST ----- Skin , right scapula MALIGNANT MELANOMA MELANOMA TABLE (AJCC 8TH EDITION#) PROCEDURE: SHAVE BIOPSY SPECIMEN ANATOMIC SITE: RIGHT SCAPULA HISTOLOGIC TYPE: SUPERFICIAL SPREADING BRESLOW'S DEPTH/MAXIMUM TUMOR THICKNESS: AT LEAST 0.3 MM CLARK/ANATOMIC LEVEL: II MARGINS PERIPHERAL MARGINS: INVOLVED (MELANOMA IN SITU) DEEP MARGIN: INVOLVED ULCERATION: ABSENT SATELLITOSIS: ABSENT MITOTIC INDEX: 0 LYMPHO-VASCULAR INVASION: ABSENT NEUROTROPISM: ABSENT TUMOR-INFILTRATING LYMPHOCYTES: NON-BRISK TUMOR REGRESSION: FOCALLY PRESENT LYMPH NODES (IF APPLICABLE): N/A PATHOLOGIC STAGE: AT LEAST PT1B  Melanoma skin cancer, at least 0.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  Pt needs WLE here in office.  Please schedule and contact patient.

## 2021-09-09 NOTE — Telephone Encounter (Signed)
Dr. Nicole Kindred discussed bx results with pt.  I scheduled pt for surgery 09/16/21 at 2:30./sh

## 2021-09-11 ENCOUNTER — Encounter: Payer: Self-pay | Admitting: Internal Medicine

## 2021-09-16 ENCOUNTER — Encounter: Payer: Self-pay | Admitting: Dermatology

## 2021-09-16 ENCOUNTER — Other Ambulatory Visit: Payer: Self-pay

## 2021-09-16 ENCOUNTER — Ambulatory Visit (INDEPENDENT_AMBULATORY_CARE_PROVIDER_SITE_OTHER): Payer: Medicare Other | Admitting: Dermatology

## 2021-09-16 DIAGNOSIS — C439 Malignant melanoma of skin, unspecified: Secondary | ICD-10-CM

## 2021-09-16 DIAGNOSIS — C4359 Malignant melanoma of other part of trunk: Secondary | ICD-10-CM | POA: Diagnosis not present

## 2021-09-16 MED ORDER — MUPIROCIN 2 % EX OINT
TOPICAL_OINTMENT | CUTANEOUS | 0 refills | Status: DC
Start: 2021-09-16 — End: 2022-07-11

## 2021-09-16 NOTE — Patient Instructions (Signed)

## 2021-09-16 NOTE — Progress Notes (Signed)
° °  Follow-Up Visit   Subjective  Audrey Hayden is a 71 y.o. female who presents for the following: Malignant melanoma bx proven\ (R scapula, pt presents for excision today).   The following portions of the chart were reviewed this encounter and updated as appropriate:       Review of Systems:  No other skin or systemic complaints except as noted in HPI or Assessment and Plan.  Objective  Well appearing patient in no apparent distress; mood and affect are within normal limits.  A focused examination was performed including back. Relevant physical exam findings are noted in the Assessment and Plan.  R scapula Pink bx site with pigmentation at edge 1.7 x 1.4cm    Assessment & Plan  Melanoma of skin ([place]) R scapula  Skin excision  Lesion length (cm):  1.7 Lesion width (cm):  1.4 Margin per side (cm):  1 Total excision diameter (cm):  3.7 Informed consent: discussed and consent obtained   Timeout: patient name, date of birth, surgical site, and procedure verified   Procedure prep:  Patient was prepped and draped in usual sterile fashion Prep type:  Povidone-iodine Anesthesia: the lesion was anesthetized in a standard fashion   Anesthetic:  1% lidocaine w/ epinephrine 1-100,000 buffered w/ 8.4% NaHCO3 (15cc lidocaine with epi, 10cc bupivicaine, Total of 25cc) Instrument used: #15 blade   Hemostasis achieved with: pressure   Hemostasis achieved with comment:  Electrocautery Outcome: patient tolerated procedure well with no complications   Additional details:  Tag at superior medial 12:00 tip  Skin repair Complexity:  Complex Final length (cm):  9.2 Informed consent: discussed and consent obtained   Timeout: patient name, date of birth, surgical site, and procedure verified   Reason for type of repair: reduce tension to allow closure, reduce the risk of dehiscence, infection, and necrosis, reduce subcutaneous dead space and avoid a hematoma, allow closure of the large  defect, preserve normal anatomical and functional relationships, allow side-to-side closure without requiring a flap or graft and enhance both functionality and cosmetic results   Undermining: edges undermined and area extensively undermined   Undermining comment:  3.7 cm Subcutaneous layers (deep stitches):  Suture size:  3-0 Suture type: Vicryl (polyglactin 910)   Stitches:  Buried vertical mattress Fine/surface layer approximation (top stitches):  Suture size:  3-0 Suture type: nylon   Stitches: vertical mattress and simple interrupted   Suture removal (days):  7 Hemostasis achieved with: suture Outcome: patient tolerated procedure well with no complications   Post-procedure details: sterile dressing applied and wound care instructions given   Dressing type: pressure dressing (Mupirocin)    mupirocin ointment (BACTROBAN) 2 % Apply to affected area once daily with bandage change.  Specimen 1 - Surgical pathology Differential Diagnosis: Bx proven Melanoma Check Margins: Yes Pink bx site 1.7 x 1.4cm [id number] Tag at superior medial 12:00 tip  Bx proven invasive malignant melanoma at least 0.64mm, Level II  Excised today Discussed [place] [facility] Testing to further assess risk of spread since deep margin involved, will fax forms.  Start mupirocin 2% ointment apply to AA QD with bandage change.   Return in about 1 week (around 09/23/2021) for suture removal.  I, Audrey Hayden, RMA, am acting as scribe for Audrey Patty, MD .  Documentation: I have reviewed the above documentation for accuracy and completeness, and I agree with the above.  Audrey Patty MD

## 2021-09-17 ENCOUNTER — Telehealth: Payer: Self-pay

## 2021-09-17 NOTE — Telephone Encounter (Signed)
Pt doing well after yesterday's surgery.  Pt has not had any pain from the procedure./sh

## 2021-09-18 ENCOUNTER — Encounter: Payer: Self-pay | Admitting: Internal Medicine

## 2021-09-18 ENCOUNTER — Telehealth: Payer: Self-pay

## 2021-09-18 ENCOUNTER — Other Ambulatory Visit: Payer: Self-pay

## 2021-09-18 DIAGNOSIS — R1319 Other dysphagia: Secondary | ICD-10-CM

## 2021-09-18 DIAGNOSIS — T781XXD Other adverse food reactions, not elsewhere classified, subsequent encounter: Secondary | ICD-10-CM

## 2021-09-18 DIAGNOSIS — R195 Other fecal abnormalities: Secondary | ICD-10-CM

## 2021-09-18 DIAGNOSIS — R634 Abnormal weight loss: Secondary | ICD-10-CM

## 2021-09-18 DIAGNOSIS — K760 Fatty (change of) liver, not elsewhere classified: Secondary | ICD-10-CM

## 2021-09-18 NOTE — Telephone Encounter (Signed)
Patient called and left voicemail that she thinks she is having a allergic reaction to the mupirocin ointment. Patient states she is having stomach pain and heart palpitations.

## 2021-09-18 NOTE — Telephone Encounter (Signed)
Patient advised of information per Dr. Nicole Kindred.

## 2021-09-19 ENCOUNTER — Telehealth: Payer: Self-pay

## 2021-09-19 NOTE — Telephone Encounter (Signed)
Copied from [place] [phone] [id number]. Topic: General - Other >> Sep 19, 2021  9:39 AM Tessa Lerner A wrote: Reason for CRM: Lattie Haw with Dr. Lorie Apley office has called to share that Dr. Collene Mares is not currently accepting new medicare patients at the time  Lattie Haw shares that patient will need to be referred to another GI specialist   Please contact further if needed

## 2021-09-19 NOTE — Telephone Encounter (Signed)
Spoke to pt let her know that Dr. Youlanda Mighty office is not accepting new medicare pts at this time. Pt stated she would do some research and call us back to let us know who she wants to see.  KP

## 2021-09-23 ENCOUNTER — Other Ambulatory Visit: Payer: Self-pay

## 2021-09-23 ENCOUNTER — Ambulatory Visit (INDEPENDENT_AMBULATORY_CARE_PROVIDER_SITE_OTHER): Payer: Medicare Other | Admitting: Dermatology

## 2021-09-23 DIAGNOSIS — C4361 Malignant melanoma of right upper limb, including shoulder: Secondary | ICD-10-CM

## 2021-09-23 DIAGNOSIS — C439 Malignant melanoma of skin, unspecified: Secondary | ICD-10-CM

## 2021-09-23 NOTE — Patient Instructions (Signed)

## 2021-09-23 NOTE — Progress Notes (Signed)
° °  Follow-Up Visit   Subjective  Audrey Hayden is a 71 y.o. female who presents for the following: Melanoma margins free, bx proven (R scapula, pt presents for suture removal).   The following portions of the chart were reviewed this encounter and updated as appropriate:       Review of Systems:  No other skin or systemic complaints except as noted in HPI or Assessment and Plan.  Objective  Well appearing patient in no apparent distress; mood and affect are within normal limits.  A focused examination was performed including back. Relevant physical exam findings are noted in the Assessment and Plan.  R scapula Healing excision site    Assessment & Plan  Melanoma of skin ([place]) R scapula  Margins free, bx proven  Encounter for Removal of Sutures - Incision site at the R scapula is clean, dry and intact - Wound cleansed, sutures removed, wound cleansed and steri strips applied.  - Discussed pathology results showing Melanoma margins free  - Patient advised to keep steri-strips dry until they fall off. - Scars remodel for a full year. - Once steri-strips fall off, patient can apply over-the-counter silicone scar cream each night to help with scar remodeling if desired. - Patient advised to call with any concerns or if they notice any new or changing lesions.   Related Medications mupirocin ointment (BACTROBAN) 2 % Apply to affected area once daily with bandage change.   Return in about 3 months (around 12/22/2021) for TBSE, Hx of Melanoma.  I, Othelia Pulling, RMA, am acting as scribe for Brendolyn Patty, MD .  Documentation: I have reviewed the above documentation for accuracy and completeness, and I agree with the above.  Brendolyn Patty MD

## 2021-10-06 DIAGNOSIS — Z20822 Contact with and (suspected) exposure to covid-19: Secondary | ICD-10-CM | POA: Diagnosis not present

## 2021-10-15 DIAGNOSIS — T781XXD Other adverse food reactions, not elsewhere classified, subsequent encounter: Secondary | ICD-10-CM | POA: Diagnosis not present

## 2021-10-15 DIAGNOSIS — J3089 Other allergic rhinitis: Secondary | ICD-10-CM | POA: Diagnosis not present

## 2021-10-16 DIAGNOSIS — Z20822 Contact with and (suspected) exposure to covid-19: Secondary | ICD-10-CM | POA: Diagnosis not present

## 2021-11-05 ENCOUNTER — Other Ambulatory Visit: Payer: Self-pay

## 2021-11-05 ENCOUNTER — Ambulatory Visit (INDEPENDENT_AMBULATORY_CARE_PROVIDER_SITE_OTHER): Payer: Medicare Other | Admitting: Dermatology

## 2021-11-05 DIAGNOSIS — L821 Other seborrheic keratosis: Secondary | ICD-10-CM

## 2021-11-05 DIAGNOSIS — L814 Other melanin hyperpigmentation: Secondary | ICD-10-CM | POA: Diagnosis not present

## 2021-11-05 DIAGNOSIS — D1800 Hemangioma unspecified site: Secondary | ICD-10-CM

## 2021-11-05 DIAGNOSIS — Z1283 Encounter for screening for malignant neoplasm of skin: Secondary | ICD-10-CM | POA: Diagnosis not present

## 2021-11-05 DIAGNOSIS — D229 Melanocytic nevi, unspecified: Secondary | ICD-10-CM

## 2021-11-05 DIAGNOSIS — D225 Melanocytic nevi of trunk: Secondary | ICD-10-CM

## 2021-11-05 DIAGNOSIS — L817 Pigmented purpuric dermatosis: Secondary | ICD-10-CM | POA: Diagnosis not present

## 2021-11-05 DIAGNOSIS — Z85820 Personal history of malignant melanoma of skin: Secondary | ICD-10-CM

## 2021-11-05 DIAGNOSIS — D692 Other nonthrombocytopenic purpura: Secondary | ICD-10-CM

## 2021-11-05 DIAGNOSIS — L570 Actinic keratosis: Secondary | ICD-10-CM | POA: Diagnosis not present

## 2021-11-05 DIAGNOSIS — L578 Other skin changes due to chronic exposure to nonionizing radiation: Secondary | ICD-10-CM

## 2021-11-05 DIAGNOSIS — D489 Neoplasm of uncertain behavior, unspecified: Secondary | ICD-10-CM

## 2021-11-05 DIAGNOSIS — L820 Inflamed seborrheic keratosis: Secondary | ICD-10-CM

## 2021-11-05 DIAGNOSIS — L720 Epidermal cyst: Secondary | ICD-10-CM

## 2021-11-05 DIAGNOSIS — Q825 Congenital non-neoplastic nevus: Secondary | ICD-10-CM | POA: Diagnosis not present

## 2021-11-05 MED ORDER — MOMETASONE FUROATE 0.1 % EX CREA
1.0000 | TOPICAL_CREAM | CUTANEOUS | 1 refills | Status: DC
Start: 2021-11-05 — End: 2022-07-11

## 2021-11-05 NOTE — Progress Notes (Signed)
? ?Follow-Up Visit ?  ?Subjective  ?Audrey Hayden is a 71 y.o. female who presents for the following: Follow-up (Patient here today for tbse. Patient reports 4 spots of concern right side of back, right side of calf, left anterior calf, left anteroir thigh. Patient has history of melanoma at right scapula. ). ? ?The patient presents for Total-Body Skin Exam (TBSE) for skin cancer screening and mole check.  The patient has spots, moles and lesions to be evaluated, some may be new or changing and the patient has concerns that these could be cancer. ? ? ?The following portions of the chart were reviewed this encounter and updated as appropriate:   ?  ? ?Review of Systems: No other skin or systemic complaints except as noted in HPI or Assessment and Plan. ? ? ?Objective  ?Well appearing patient in no apparent distress; mood and affect are within normal limits. ? ?A full examination was performed including scalp, head, eyes, ears, nose, lips, neck, chest, axillae, abdomen, back, buttocks, bilateral upper extremities, bilateral lower extremities, hands, feet, fingers, toes, fingernails, and toenails. All findings within normal limits unless otherwise noted below. ? ?lower sternum intermammary ?5 mm speckled brown macule  ? ?Right Upper Back ?Subcutaneous nodule.  ? ?Right Lower Back ?9 x 6 mm speckled brown macule darker inferior  ? ? ? ? ?left lower anterior thigh ?8 mm medium light brown papule with emerging hair.  ? ?right upper forehead x 1 ?Pink scaly macule ? ? ?Assessment & Plan  ?Nevus ?lower sternum intermammary ? ?Nevus vs Isk  ? ?Benign-appearing.  Stable. Observation.  Call clinic for new or changing lesions.  Recommend daily use of broad spectrum spf 30+ sunscreen to sun-exposed areas.  ? ? ?Epidermal inclusion cyst ?Right Upper Back ? ?Benign-appearing. Exam most consistent with an epidermal inclusion cyst. Discussed that a cyst is a benign growth that can grow over time and sometimes get irritated or  inflamed. Recommend observation if it is not bothersome. Discussed option of surgical excision to remove it if it is growing, symptomatic, or other changes noted. Please call for new or changing lesions so they can be evaluated. ? ? ? 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 ?Benign, observe.   ? ?Chronic condition likely caused by persistent swelling at lower legs.  Can be seen by a vascular vein specialist for treatment of varicose veins ? ?Recommend compression stockings daily  ? ? ?Mometasone cream -  apply to affected area of left lower leg 1 - 2 times daily for up to 1 month, then d/c ?Caution skin atrophy with long-term use.  ? ?mometasone (ELOCON) 0.1 % cream - left lower leg ?Apply 1 application. topically See admin instructions. Apply 1 - 2 times daily to affected areas at left lower leg. Can use for up to 1 month ? ?Neoplasm of uncertain behavior ?Right Lower Back ? ?Epidermal / dermal shaving ? ?Lesion diameter (cm):  1 ?Informed consent: discussed and consent obtained   ?Patient was prepped and draped in usual sterile fashion: Area prepped with alcohol. ?Anesthesia: the lesion was anesthetized in a standard fashion   ?Anesthetic:  1% lidocaine w/ epinephrine 1-100,000 buffered w/ 8.4% NaHCO3 ?Instrument used: flexible razor blade   ?Hemostasis achieved with: pressure, aluminum chloride and electrodesiccation   ?Outcome: patient tolerated procedure well   ?Post-procedure details: wound care instructions given   ?Post-procedure details comment:  Ointment and small bandage applied.  ? ?Specimen 1 - Surgical pathology ?Differential Diagnosis: Nevus vs isk r/o  atypia ? ?Check Margins: No ? ?Nevus vs isk r/o atypia ? 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 ?Benign-appearing. Observation.  Call clinic for new or changing lesions.  Recommend daily use of broad spectrum spf 30+ sunscreen to sun-exposed areas.  ? ? ?Actinic keratosis ?right upper forehead x 1 ? ?Actinic keratoses are  precancerous spots that appear secondary to cumulative UV radiation exposure/sun exposure over time. They are chronic with expected duration over 1 year. A portion of actinic keratoses will progress to squamous cell carcinoma of the skin. It is not possible to reliably predict which spots will progress to skin cancer and so treatment is recommended to prevent development of skin cancer. ? ?Recommend daily broad spectrum sunscreen SPF 30+ to sun-exposed areas, reapply every 2 hours as needed.  ?Recommend staying in the shade or wearing long sleeves, sun glasses (UVA+UVB protection) and wide brim hats (4-inch brim around the entire circumference of the hat). ?Call for new or changing lesions. ? ?Destruction of lesion - right upper forehead x 1 ? ?Destruction method: cryotherapy   ?Informed consent: discussed and consent obtained   ?Lesion destroyed using liquid nitrogen: Yes   ?Region frozen until ice ball extended beyond lesion: Yes   ?Outcome: patient tolerated procedure well with no complications   ?Post-procedure details: wound care instructions given   ?Additional details:  Prior to procedure, discussed risks of blister formation, small wound, skin dyspigmentation, or rare scar following cryotherapy. Recommend Vaseline ointment to treated areas while healing. ? ? ?Lentigines ?- Scattered tan macules ?- Due to sun exposure ?- Benign-appearing, observe ?- Recommend daily broad spectrum sunscreen SPF 30+ to sun-exposed areas, reapply every 2 hours as needed. ?- Call for any changes ? ?Seborrheic Keratoses ?- Stuck-on, waxy, tan-brown papules including calf. ?- Benign-appearing ?- Discussed benign etiology and prognosis. ?- Observe ?- Call for any changes ? ?Varicose Veins/Spider Veins ?- Dilated blue, purple or red veins at the lower extremities ?- Reassured ?- Smaller vessels can be treated by sclerotherapy (a procedure to inject a medicine into the veins to make them disappear) if desired, but the treatment is not  covered by insurance. Larger vessels may be covered if symptomatic and we would refer to vascular surgeon if treatment desired. ? ?Purpura - Chronic; persistent and recurrent.  Treatable, but not curable. ?- Violaceous macules and patches at right lateral ankle  ?- Benign ?- Related to trauma, age, sun damage and/or use of blood thinners, chronic use of topical and/or oral steroids ?- Observe ?- Can use OTC arnica containing moisturizer such as Dermend Bruise Formula if desired ?- Call for worsening or other concerns ? ?Melanocytic Nevi ?- Tan-brown and/or pink-flesh-colored symmetric macules and papules ?- Benign appearing on exam today ?- Observation ?- Call clinic for new or changing moles ?- Recommend daily use of broad spectrum spf 30+ sunscreen to sun-exposed areas.  ? ?Hemangiomas ?- Red papules ?- Discussed benign nature ?- Observe ?- Call for any changes ? ?Actinic Damage ?- Chronic condition, secondary to cumulative UV/sun exposure ?- diffuse scaly erythematous macules with underlying dyspigmentation ?- Recommend daily broad spectrum sunscreen SPF 30+ to sun-exposed areas, reapply every 2 hours as needed.  ?- Staying in the shade or wearing long sleeves, sun glasses (UVA+UVB protection) and wide brim hats (4-inch brim around the entire circumference of the hat) are also recommended for sun protection.  ?- Call for new or changing lesions. ? ?History of Melanoma ?- No evidence of recurrence today at right scapula CLARK/ANATOMIC  LEVEL: II, [place] AT LEAST 0.3 MM, WLE 09/16/21 ?- No lymphadenopathy ?- Recommend regular full body skin exams ?- Recommend daily broad spectrum sunscreen SPF 30+ to sun-exposed areas, reapply every 2 hours as needed.  ?- Call if any new or changing lesions are noted between office visits ? ?Skin cancer screening performed today. ?Return for 6 month tbse h/o of melanoma. ?I, Ruthell Rummage, CMA, am acting as scribe for Brendolyn Patty, MD. ? ?Documentation: I have reviewed the above  documentation for accuracy and completeness, and I agree with the above. ? ?Brendolyn Patty MD  ? ?

## 2021-11-05 NOTE — Patient Instructions (Addendum)
Biopsy Wound Care Instructions  Leave the original bandage on for 24 hours if possible.  If the bandage becomes soaked or soiled before that time, it is OK to remove it and examine the wound.  A small amount of post-operative bleeding is normal.  If excessive bleeding occurs, remove the bandage, place gauze over the site and apply continuous pressure (no peeking) over the area for 30 minutes. If this does not work, please call our clinic as soon as possible or page your doctor if it is after hours.   Once a day, cleanse the wound with soap and water. It is fine to shower. If a thick crust develops you may use a Q-tip dipped into dilute hydrogen peroxide (mix 1:1 with water) to dissolve it.  Hydrogen peroxide can slow the healing process, so use it only as needed.    After washing, apply petroleum jelly (Vaseline) or an antibiotic ointment if your doctor prescribed one for you, followed by a bandage.    For best healing, the wound should be covered with a layer of ointment at all times. If you are not able to keep the area covered with a bandage to hold the ointment in place, this may mean re-applying the ointment several times a day.  Continue this wound care until the wound has healed and is no longer open.   Itching and mild discomfort is normal during the healing process. However, if you develop pain or severe itching, please call our office.   If you have any discomfort, you can take Tylenol (acetaminophen) or ibuprofen as directed on the bottle. (Please do not take these if you have an allergy to them or cannot take them for another reason).  Some redness, tenderness and white or yellow material in the wound is normal healing.  If the area becomes very sore and red, or develops a thick yellow-green material (pus), it may be infected; please notify us.    If you have stitches, return to clinic as directed to have the stitches removed. You will continue wound care for 2-3 days after the stitches  are removed.   Wound healing continues for up to one year following surgery. It is not unusual to experience pain in the scar from time to time during the interval.  If the pain becomes severe or the scar thickens, you should notify the office.    A slight amount of redness in a scar is expected for the first six months.  After six months, the redness will fade and the scar will soften and fade.  The color difference becomes less noticeable with time.  If there are any problems, return for a post-op surgery check at your earliest convenience.  To improve the appearance of the scar, you can use silicone scar gel, cream, or sheets (such as Mederma or Serica) every night for up to one year. These are available over the counter (without a prescription).  Please call our office at [phone] for any questions or concerns.   Seborrheic Keratosis  What causes seborrheic keratoses? Seborrheic keratoses are harmless, common skin growths that first appear during adult life.  As time goes by, more growths appear.  Some people may develop a large number of them.  Seborrheic keratoses appear on both covered and uncovered body parts.  They are not caused by sunlight.  The tendency to develop seborrheic keratoses can be inherited.  They vary in color from skin-colored to gray, brown, or even black.  They can be  either smooth or have a rough, warty surface.   Seborrheic keratoses are superficial and look as if they were stuck on the skin.  Under the microscope this type of keratosis looks like layers upon layers of skin.  That is why at times the top layer may seem to fall off, but the rest of the growth remains and re-grows.    Treatment Seborrheic keratoses do not need to be treated, but can easily be removed in the office.  Seborrheic keratoses often cause symptoms when they rub on clothing or jewelry.  Lesions can be in the way of shaving.  If they become inflamed, they can cause itching, soreness, or  burning.  Removal of a seborrheic keratosis can be accomplished by freezing, burning, or surgery. If any spot bleeds, scabs, or grows rapidly, please return to have it checked, as these can be an indication of a skin cancer.  Actinic keratoses are precancerous spots that appear secondary to cumulative UV radiation exposure/sun exposure over time. They are chronic with expected duration over 1 year. A portion of actinic keratoses will progress to squamous cell carcinoma of the skin. It is not possible to reliably predict which spots will progress to skin cancer and so treatment is recommended to prevent development of skin cancer.  Recommend daily broad spectrum sunscreen SPF 30+ to sun-exposed areas, reapply every 2 hours as needed.  Recommend staying in the shade or wearing long sleeves, sun glasses (UVA+UVB protection) and wide brim hats (4-inch brim around the entire circumference of the hat). Call for new or changing lesions.      Melanoma ABCDEs  Melanoma is the most dangerous type of skin cancer, and is the leading cause of death from skin disease.  You are more likely to develop melanoma if you: Have light-colored skin, light-colored eyes, or red or blond hair Spend a lot of time in the sun Tan regularly, either outdoors or in a tanning bed Have had blistering sunburns, especially during childhood Have a close family member who has had a melanoma Have atypical moles or large birthmarks  Early detection of melanoma is key since treatment is typically straightforward and cure rates are extremely high if we catch it early.   The first sign of melanoma is often a change in a mole or a new dark spot.  The ABCDE system is a way of remembering the signs of melanoma.  A for asymmetry:  The two halves do not match. B for border:  The edges of the growth are irregular. C for color:  A mixture of colors are present instead of an even brown color. D for diameter:  Melanomas are usually (but  not always) greater than 40m - the size of a pencil eraser. E for evolution:  The spot keeps changing in size, shape, and color.  Please check your skin once per month between visits. You can use a small mirror in front and a large mirror behind you to keep an eye on the back side or your body.   If you see any new or changing lesions before your next follow-up, please call to schedule a visit.  Please continue daily skin protection including broad spectrum sunscreen SPF 30+ to sun-exposed areas, reapplying every 2 hours as needed when you're outdoors.   Staying in the shade or wearing long sleeves, sun glasses (UVA+UVB protection) and wide brim hats (4-inch brim around the entire circumference of the hat) are also recommended for sun protection.    If  You Need Anything After Your Visit  If you have any questions or concerns for your doctor, please call our main line at [phone] and press option 4 to reach your doctor's medical assistant. If no one answers, please leave a voicemail as directed and we will return your call as soon as possible. Messages left after 4 pm will be answered the following business day.   You may also send [place] a message via [place]. We typically respond to MyChart messages within 1-2 business days.  For prescription refills, please ask your pharmacy to contact our office. Our fax number is [phone].  If you have an urgent issue when the clinic is closed that cannot wait until the next business day, you can page your doctor at the number below.    Please note that while we do our best to be available for urgent issues outside of office hours, we are not available 24/7.   If you have an urgent issue and are unable to reach [place], you may choose to seek medical care at your doctor's office, retail clinic, urgent care center, or emergency room.  If you have a medical emergency, please immediately call 911 or go to the emergency department.  Pager Numbers  - Dr.  Nehemiah Massed: [phone]  - Dr. Laurence Ferrari: [phone]  - Dr. Nicole Kindred: [phone]  In the event of inclement weather, please call our main line at [phone] for an update on the status of any delays or closures.  Dermatology Medication Tips: Please keep the boxes that topical medications come in in order to help keep track of the instructions about where and how to use these. Pharmacies typically print the medication instructions only on the boxes and not directly on the medication tubes.   If your medication is too expensive, please contact our office at [phone] option 4 or send [place] a message through [place].   We are unable to tell what your co-pay for medications will be in advance as this is different depending on your insurance coverage. However, we may be able to find a substitute medication at lower cost or fill out paperwork to get insurance to cover a needed medication.   If a prior authorization is required to get your medication covered by your insurance company, please allow [place] 1-2 business days to complete this process.  Drug prices often vary depending on where the prescription is filled and some pharmacies may offer cheaper prices.  The website www.goodrx.com contains coupons for medications through different pharmacies. The prices here do not account for what the cost may be with help from insurance (it may be cheaper with your insurance), but the website can give you the price if you did not use any insurance.  - You can print the associated coupon and take it with your prescription to the pharmacy.  - You may also stop by our office during regular business hours and pick up a GoodRx coupon card.  - If you need your prescription sent electronically to a different pharmacy, notify our office through [facility] or by phone at [phone] option 4.     Si Usted Necesita Algo Despus de Su Visita  Tambin puede enviarnos un mensaje a travs de Pharmacist, community. Por lo  general respondemos a los mensajes de MyChart en el transcurso de 1 a 2 das hbiles.  Para renovar recetas, por favor pida a su farmacia que se ponga en contacto con nuestra oficina. Harland Dingwall de fax es [place] [phone].  Si tiene  un asunto urgente cuando la clnica est cerrada y que no puede esperar hasta el siguiente da hbil, puede llamar/localizar a su doctor(a) al nmero que aparece a continuacin.   Por favor, tenga en cuenta que aunque hacemos todo lo posible para estar disponibles para asuntos urgentes fuera del horario de [place], no estamos disponibles las 24 horas del da, los 7 das de la [place].   Si tiene un problema urgente y no puede comunicarse con nosotros, puede optar por buscar atencin mdica  en el consultorio de su doctor(a), en una clnica privada, en un centro de atencin urgente o en una sala de emergencias.  Si tiene Engineering geologist, por favor llame inmediatamente al 911 o vaya a la sala de emergencias.  Nmeros de bper  - Dr. Nehemiah Massed: [phone]  - Dra. Moye: [phone]  - Dra. Nicole Kindred: [phone]  En caso de inclemencias del [place], por favor llame a Johnsie Kindred principal al [phone] para una actualizacin sobre el [place] de cualquier retraso o cierre.  Consejos para la medicacin en dermatologa: Por favor, guarde las cajas en las que vienen los medicamentos de uso tpico para ayudarle a seguir las instrucciones sobre dnde y cmo usarlos. Las farmacias generalmente imprimen las instrucciones del medicamento slo en las cajas y no directamente en los tubos del [place].   Si su medicamento es muy caro, por favor, pngase en contacto con Zigmund Daniel llamando al [phone] y presione la opcin 4 o envenos un mensaje a travs de Pharmacist, community.   No podemos decirle cul ser su copago por los medicamentos por adelantado ya que esto es diferente dependiendo de la cobertura de su seguro. Sin embargo, es posible que podamos encontrar un  medicamento sustituto a Electrical engineer un formulario para que el seguro cubra el medicamento que se considera necesario.   Si se requiere una autorizacin previa para que su compaa de seguros [place] su medicamento, por favor permtanos de 1 a 2 das hbiles para completar este proceso.  Los precios de los medicamentos varan con frecuencia dependiendo del Environmental consultant de dnde se surte la receta y alguna farmacias pueden ofrecer precios ms baratos.  El sitio web www.goodrx.com tiene cupones para medicamentos de Airline pilot. Los precios aqu no tienen en cuenta lo que podra costar con la ayuda del seguro (puede ser ms barato con su seguro), pero el sitio web puede darle el precio si no utiliz Research scientist (physical sciences).  - Puede imprimir el cupn correspondiente y llevarlo con su receta a la farmacia.  - Tambin puede pasar por nuestra oficina durante el horario de atencin regular y Charity fundraiser una tarjeta de cupones de GoodRx.  - Si necesita que su receta se enve electrnicamente a una farmacia diferente, informe a nuestra oficina a travs de MyChart de [place] o por telfono llamando al [phone] y presione la opcin 4.

## 2021-11-11 ENCOUNTER — Telehealth: Payer: Self-pay

## 2021-11-11 NOTE — Telephone Encounter (Signed)
-----   Message from Brendolyn Patty, MD sent at 11/08/2021  3:23 PM EST ----- ?Skin , right lower back ?SEBORRHEIC KERATOSIS, IRRITATED ? ?Benign ISK, no further treatment - please call patient ?

## 2021-11-11 NOTE — Telephone Encounter (Signed)
Advised pt of bx results/sh ?

## 2021-11-14 ENCOUNTER — Telehealth: Payer: Self-pay

## 2021-11-14 ENCOUNTER — Ambulatory Visit (INDEPENDENT_AMBULATORY_CARE_PROVIDER_SITE_OTHER): Payer: Self-pay | Admitting: Gastroenterology

## 2021-11-14 DIAGNOSIS — Z23 Encounter for immunization: Secondary | ICD-10-CM

## 2021-11-14 NOTE — Telephone Encounter (Signed)
Pt was seen today for 3 of 3 Hep A/B ? ?Was a Dr Bonna Gains pt who will be transferring to Dr Allen Norris ?

## 2021-11-14 NOTE — Progress Notes (Cosign Needed)
Pt presented to the office to receive 3 of 3 Hep A/Hep B vaccine ? ?Given left deltoid, pt tolerated well ? ? ?

## 2021-11-25 ENCOUNTER — Telehealth: Payer: Self-pay | Admitting: Gastroenterology

## 2021-11-25 NOTE — Telephone Encounter (Signed)
Pt needs first available appointment ?

## 2021-11-26 NOTE — Telephone Encounter (Signed)
Appt scheduled

## 2021-11-29 DIAGNOSIS — Z20822 Contact with and (suspected) exposure to covid-19: Secondary | ICD-10-CM | POA: Diagnosis not present

## 2021-12-01 DIAGNOSIS — Z20822 Contact with and (suspected) exposure to covid-19: Secondary | ICD-10-CM | POA: Diagnosis not present

## 2021-12-11 DIAGNOSIS — E041 Nontoxic single thyroid nodule: Secondary | ICD-10-CM | POA: Diagnosis not present

## 2021-12-16 DIAGNOSIS — Z20822 Contact with and (suspected) exposure to covid-19: Secondary | ICD-10-CM | POA: Diagnosis not present

## 2021-12-17 ENCOUNTER — Ambulatory Visit (INDEPENDENT_AMBULATORY_CARE_PROVIDER_SITE_OTHER): Payer: Medicare Other | Admitting: Gastroenterology

## 2021-12-17 ENCOUNTER — Encounter: Payer: Self-pay | Admitting: Gastroenterology

## 2021-12-17 VITALS — BP 114/69 | HR 64 | Temp 97.8°F | Wt 113.5 lb

## 2021-12-17 DIAGNOSIS — R140 Abdominal distension (gaseous): Secondary | ICD-10-CM

## 2021-12-17 DIAGNOSIS — K589 Irritable bowel syndrome without diarrhea: Secondary | ICD-10-CM | POA: Diagnosis not present

## 2021-12-17 NOTE — Progress Notes (Signed)
? ? ?Primary Care Physician: Glean Hess, MD ? ?Primary Gastroenterologist:  Dr. Lucilla Lame ? ?Chief Complaint  ?Patient presents with  ? Bloated  ?  Transfer Dr Bonna Gains pt ?Pt reports abd bloating after each meal and snack  ? ? ?HPI: Audrey Hayden is a 71 y.o. female here for follow-up after seeing Dr. Bonna Gains every 1 to 2 months since March 2022 up until December of last year.  The patient had a history of abdominal pain, altered bowel habits, reporting 1 formed bowel movement daily and had also reported weight loss with her weight being 116 in June and then down to 111 pounds at her last visit.  The patient had imaging that suggested fatty liver and underwent a fibrosis scoring test which showed her to have no fibrosis.  The patient has also had vaccinations for hepatitis A and B due to the fatty liver.  The patient underwent an EGD and colonoscopy in 2022 for diarrhea and weight loss.  Biopsies taken of the colon esophagus stomach and small bowel did not show any cause for patient's symptoms.  The patient also has a history of irritable bowel syndrome.  ?The patient has multiple concerns today and has questions regarding feeling that she may be making too much acid that may be causing her problems in addition to she states she has done a lot of research on different foods that produce more or less acid in the stomach and has been trying to tell her her diet to this.  She also questions whether she has too much production of mucus in her stomach and states that her previous upper endoscopies showed excessive amounts of mucus although her last upper endoscopy was reviewed including the pictures that did not show any such finding.  The patient reports that her son is a anesthesiologist and is concerned that she may have small intestinal bacterial overgrowth. The patient weighed in today and 113 pounds but states that it is from her clothes although during the winter she weighed in at 111  pounds ? ?Past Medical History:  ?Diagnosis Date  ? Allergy   ? Bronchiectasis ([place])   ? Celiac disease   ? GERD (gastroesophageal reflux disease)   ? Gross hematuria 02/25/2019  ? IBS (irritable bowel syndrome)   ? Melanoma ([place]) 09/03/2021  ? R scapula, CLARK/ANATOMIC LEVEL: II, [place] AT LEAST 0.3 MM, WLE 09/16/21  ? Osteoporosis   ? ? ?Current Outpatient Medications  ?Medication Sig Dispense Refill  ? cetirizine (ZYRTEC) 10 MG tablet Take 10 mg by mouth daily.    ? Homeopathic Products (LIVER SUPPORT SL) Place under the tongue.    ? LYSINE PO Take by mouth.    ? Melatonin ([employer] MELATONIN GUMMIES) 1 MG CHEW Chew by mouth.    ? mometasone (ELOCON) 0.1 % cream Apply 1 application. topically See admin instructions. Apply 1 - 2 times daily to affected areas at left lower leg. Can use for up to 1 month 50 g 1  ? Multiple Vitamins-Minerals (ALIVE WOMENS 50+ PO)     ? mupirocin ointment (BACTROBAN) 2 % Apply to affected area once daily with bandage change. 22 g 0  ? Pancreatin 500 MG CAPS Take by mouth. Digestive enzymes    ? Probiotic Product (PROBIOTIC BLEND PO) in the morning, at noon, and at bedtime.    ? Quercetin 250 MG TABS Take 500 mg by mouth at bedtime.    ? ?No current facility-administered medications for this visit.  ? ? ?  Allergies as of 12/17/2021 - Review Complete 12/17/2021  ?Allergen Reaction Noted  ? Eggs or egg-derived products Itching and Rash 07/21/2019  ? Amoxicillin Other (See Comments) 09/18/2014  ? Azelastine hcl Other (See Comments) 01/18/2021  ? Cefdinir  09/18/2014  ? Clarithromycin Other (See Comments) 09/18/2014  ? Codeine Other (See Comments) 09/18/2014  ? Hydromorphone hcl Other (See Comments) 09/18/2014  ? Hydromorphone hcl Other (See Comments) 06/10/2020  ? Ibuprofen Other (See Comments) 01/18/2021  ? Meperidine Other (See Comments) 09/18/2014  ? Montelukast Other (See Comments) 01/18/2021  ? Moxifloxacin Other (See Comments) 11/14/2015  ? Nsaids  09/18/2014  ? Omeprazole Other (See  Comments) 06/19/2015  ? Other Other (See Comments) 09/18/2014  ? Prednisone Other (See Comments) 09/18/2014  ? Pseudoephedrine Other (See Comments) 01/18/2021  ? Pseudoephedrine hcl  09/18/2014  ? Ranitidine  06/19/2015  ? Salicylates Hives 65/78/4696  ? Aspirin Hives and Rash 09/18/2014  ? Dexilant [dexlansoprazole] Rash 09/12/2019  ? Doxycycline Other (See Comments) 09/14/2017  ? Montelukast sodium Palpitations 09/18/2014  ? ? ?ROS: ? ?General: Negative for anorexia, weight loss, fever, chills, fatigue, weakness. ?ENT: Negative for hoarseness, difficulty swallowing , nasal congestion. ?CV: Negative for chest pain, angina, palpitations, dyspnea on exertion, peripheral edema.  ?Respiratory: Negative for dyspnea at rest, dyspnea on exertion, cough, sputum, wheezing.  ?GI: See history of present illness. ?GU:  Negative for dysuria, hematuria, urinary incontinence, urinary frequency, nocturnal urination.  ?Endo: Negative for unusual weight change.  ?  ?Physical Examination: ? ? BP 114/69   Pulse 64   Temp 97.8 ?F (36.6 ?C) (Oral)   Wt 113 lb 8 oz (51.5 kg)   BMI 17.26 kg/m?  ? ?General: Well-nourished, well-developed in no acute distress.  ?Eyes: No icterus. Conjunctivae pink. ?Neuro: Alert and oriented x 3.  Grossly intact. ?Skin: Warm and dry, no jaundice.   ?Psych: Alert and cooperative, normal mood and affect. ? ?Labs:  ?  ?Imaging Studies: ?No results found. ? ?Assessment and Plan:  ? ?Audrey Hayden is a 72 y.o. y/o female who comes in today with a history of irritable bowel syndrome and fatty liver with multiple questions about her symptoms including bloating and whether she has small intestinal bacterial overgrowth. The patient already has a kit to send off for Cipro but has not done it yet.  The patient also was explained the pathophysiology of acid and mucus in the stomach.  She is also been told that her bloating that seems to be a problem is likely caused by air intake or something she is eating and  has been minded that the stomach nor does the small intestines make air but the colon and bacteria can.  If the patient is found to have small intestinal bacterial overgrowth then she will be treated.  She has also been told that if it is negative that she should consider that her paternal bowel syndrome is likely the cause of many of her problems.  The patient has been explained the plan and agrees with it. ? ? ? ? ?Lucilla Lame, MD. Marval Regal ? ? ? Note: This dictation was prepared with Dragon dictation along with smaller phrase technology. Any transcriptional errors that result from this process are unintentional.  ?

## 2021-12-24 ENCOUNTER — Ambulatory Visit: Payer: Medicare Other | Admitting: Dermatology

## 2022-01-03 DIAGNOSIS — Z20822 Contact with and (suspected) exposure to covid-19: Secondary | ICD-10-CM | POA: Diagnosis not present

## 2022-01-06 DIAGNOSIS — Z20822 Contact with and (suspected) exposure to covid-19: Secondary | ICD-10-CM | POA: Diagnosis not present

## 2022-01-20 ENCOUNTER — Ambulatory Visit: Payer: Medicare Other | Admitting: Gastroenterology

## 2022-01-28 DIAGNOSIS — R490 Dysphonia: Secondary | ICD-10-CM | POA: Diagnosis not present

## 2022-01-28 DIAGNOSIS — K219 Gastro-esophageal reflux disease without esophagitis: Secondary | ICD-10-CM | POA: Diagnosis not present

## 2022-01-29 DIAGNOSIS — E041 Nontoxic single thyroid nodule: Secondary | ICD-10-CM | POA: Diagnosis not present

## 2022-01-31 ENCOUNTER — Ambulatory Visit
Admission: RE | Admit: 2022-01-31 | Discharge: 2022-01-31 | Disposition: A | Discharge status: Home / Self Care | Payer: Medicare Other | Source: Ambulatory Visit | Attending: Internal Medicine | Admitting: Internal Medicine

## 2022-01-31 DIAGNOSIS — Z1231 Encounter for screening mammogram for malignant neoplasm of breast: Secondary | ICD-10-CM | POA: Insufficient documentation

## 2022-02-14 DIAGNOSIS — J471 Bronchiectasis with (acute) exacerbation: Secondary | ICD-10-CM | POA: Diagnosis not present

## 2022-02-14 DIAGNOSIS — R911 Solitary pulmonary nodule: Secondary | ICD-10-CM | POA: Diagnosis not present

## 2022-02-14 DIAGNOSIS — R918 Other nonspecific abnormal finding of lung field: Secondary | ICD-10-CM | POA: Diagnosis not present

## 2022-02-14 DIAGNOSIS — R053 Chronic cough: Secondary | ICD-10-CM | POA: Diagnosis not present

## 2022-02-17 ENCOUNTER — Other Ambulatory Visit: Payer: Self-pay | Admitting: Specialist

## 2022-02-17 DIAGNOSIS — R053 Chronic cough: Secondary | ICD-10-CM

## 2022-02-17 DIAGNOSIS — R911 Solitary pulmonary nodule: Secondary | ICD-10-CM

## 2022-02-17 DIAGNOSIS — J471 Bronchiectasis with (acute) exacerbation: Secondary | ICD-10-CM

## 2022-02-24 ENCOUNTER — Ambulatory Visit
Admission: RE | Admit: 2022-02-24 | Discharge: 2022-02-24 | Disposition: A | Discharge status: Home / Self Care | Payer: Medicare Other | Source: Ambulatory Visit | Attending: Specialist | Admitting: Specialist

## 2022-02-24 DIAGNOSIS — R053 Chronic cough: Secondary | ICD-10-CM

## 2022-02-24 DIAGNOSIS — J479 Bronchiectasis, uncomplicated: Secondary | ICD-10-CM | POA: Diagnosis not present

## 2022-02-24 DIAGNOSIS — J471 Bronchiectasis with (acute) exacerbation: Secondary | ICD-10-CM

## 2022-02-24 DIAGNOSIS — R911 Solitary pulmonary nodule: Secondary | ICD-10-CM | POA: Diagnosis not present

## 2022-02-24 DIAGNOSIS — J449 Chronic obstructive pulmonary disease, unspecified: Secondary | ICD-10-CM | POA: Diagnosis not present

## 2022-03-10 ENCOUNTER — Ambulatory Visit (INDEPENDENT_AMBULATORY_CARE_PROVIDER_SITE_OTHER): Payer: Medicare Other | Admitting: Family Medicine

## 2022-03-10 ENCOUNTER — Encounter: Payer: Self-pay | Admitting: Family Medicine

## 2022-03-10 VITALS — BP 118/70 | HR 78 | Temp 98.2°F | Ht 68.0 in | Wt 121.8 lb

## 2022-03-10 DIAGNOSIS — B9689 Other specified bacterial agents as the cause of diseases classified elsewhere: Secondary | ICD-10-CM

## 2022-03-10 DIAGNOSIS — J329 Chronic sinusitis, unspecified: Secondary | ICD-10-CM

## 2022-03-10 IMAGING — US US ABDOMEN LIMITED
1 series · 14 of 25 positions shown · non-contrast
Comparison: None.

CLINICAL DATA: Hepatitis

EXAM:
ULTRASOUND ABDOMEN LIMITED RIGHT UPPER QUADRANT

[Series 1: us abdomen limited ruq (liver/gb) · 14 of 40 slices shown]
[im 1/40]
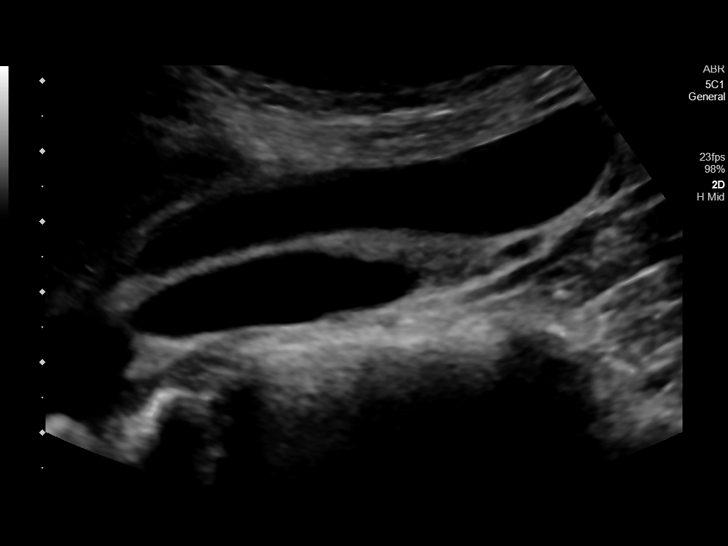
[im 4/40]
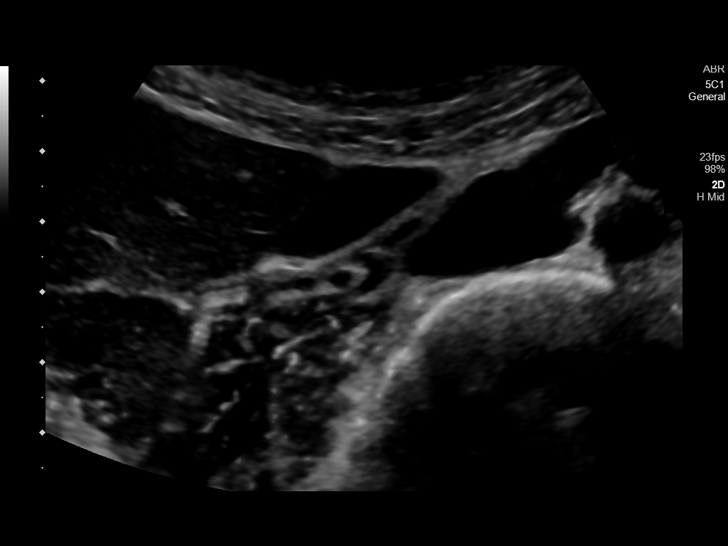
[im 7/40]
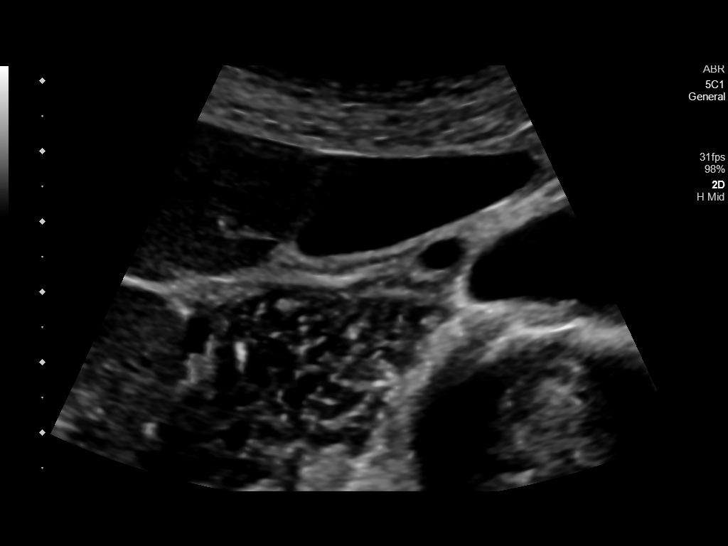
[im 10/40]
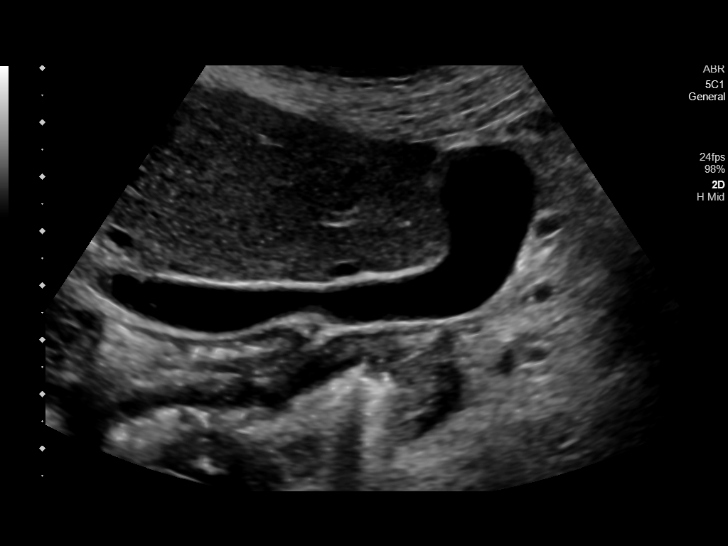
[im 14/40]
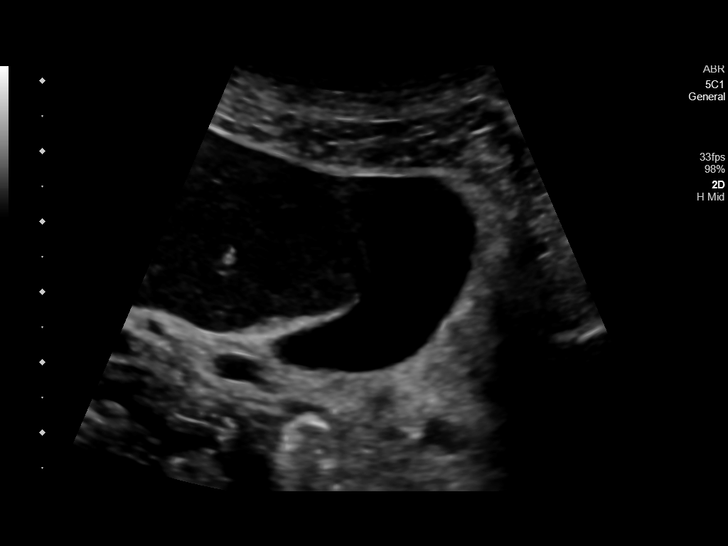
[im 15/40]
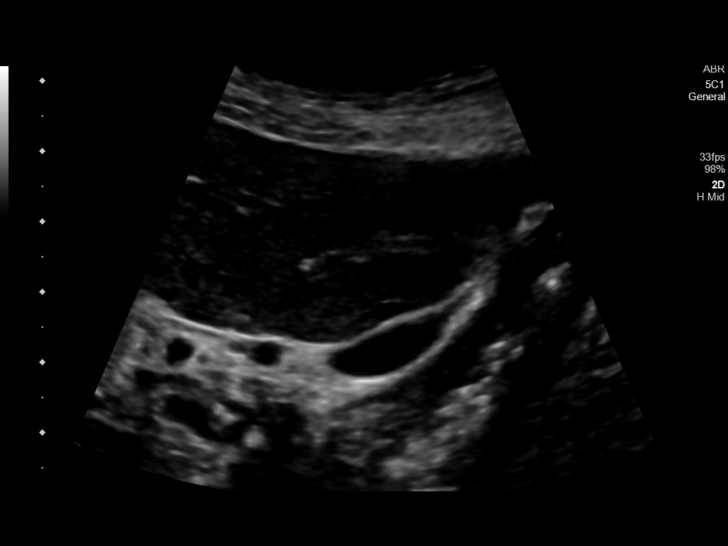
[im 18/40]
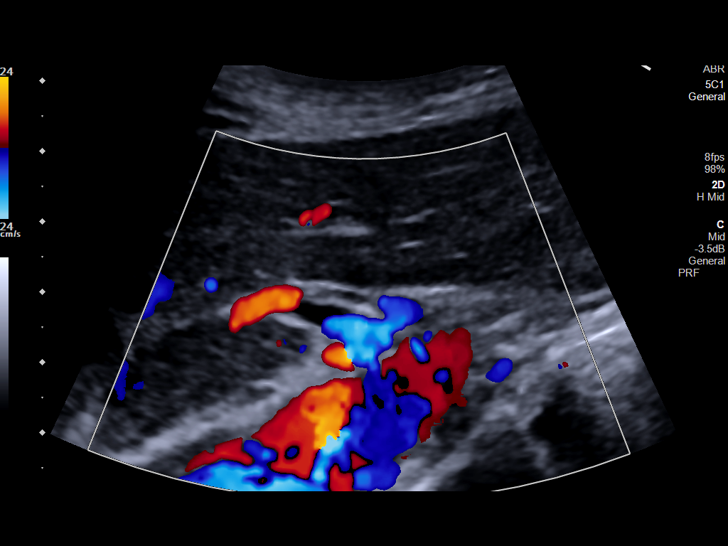
[im 22/40]
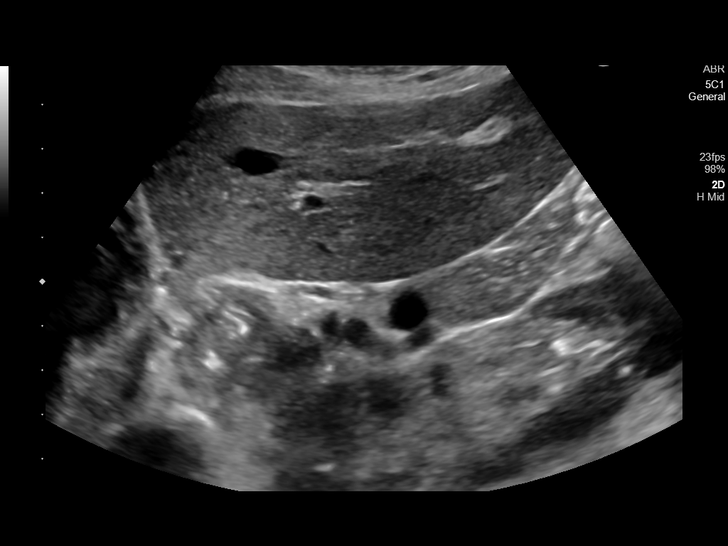
[im 25/40]
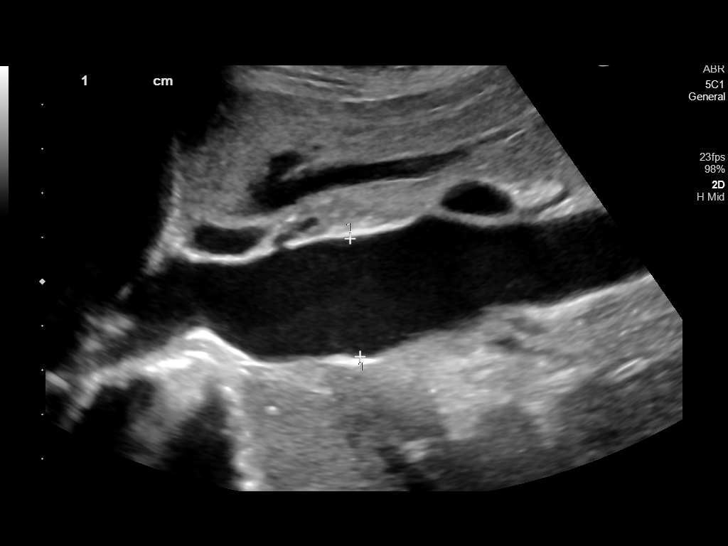
[im 27/40]
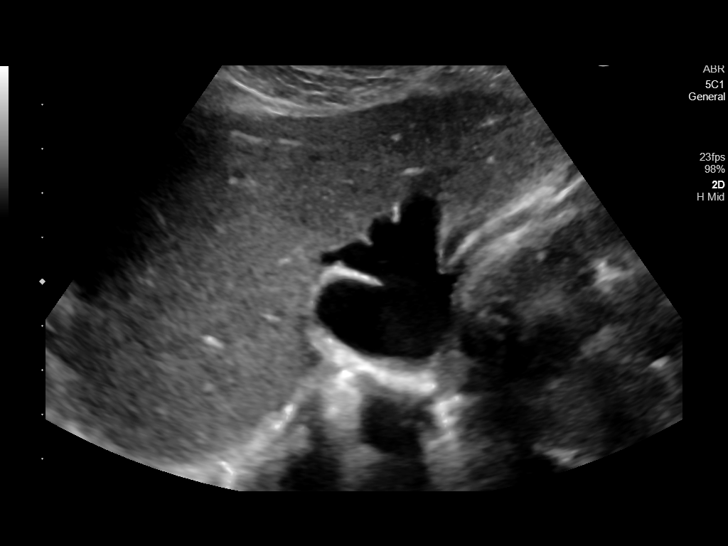
[im 30/40]
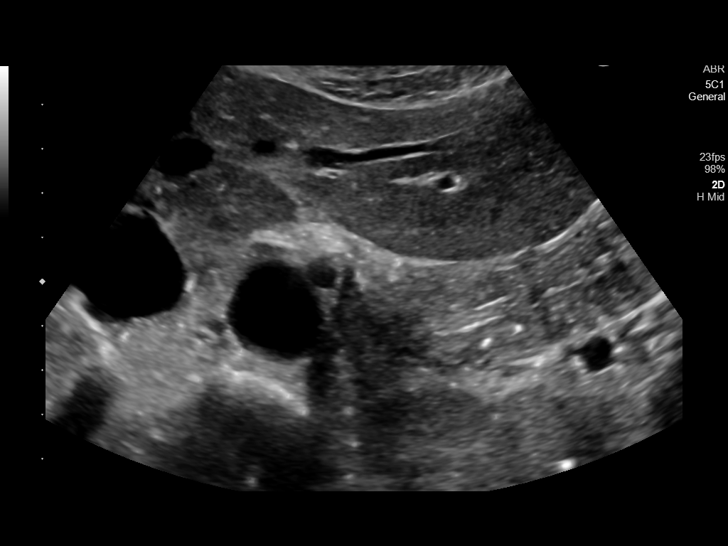
[im 33/40]
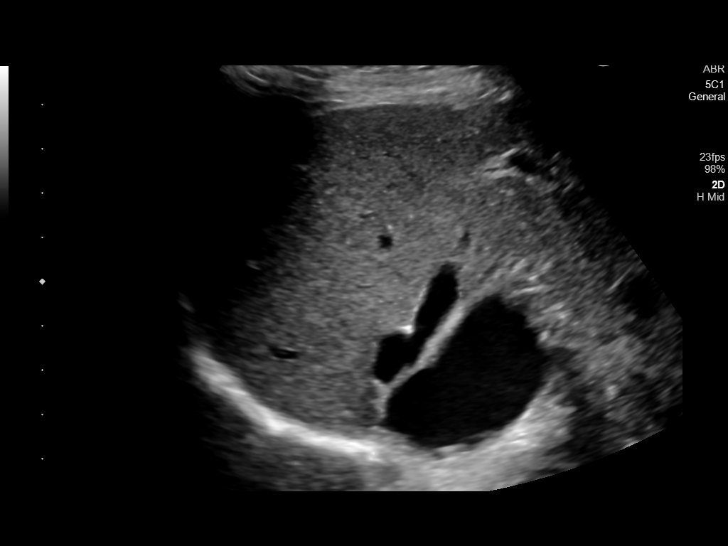
[im 36/40]
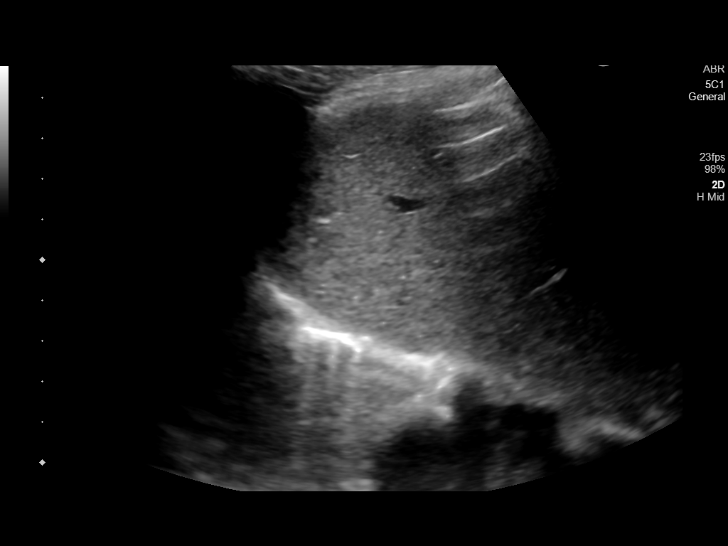
[im 40/40]
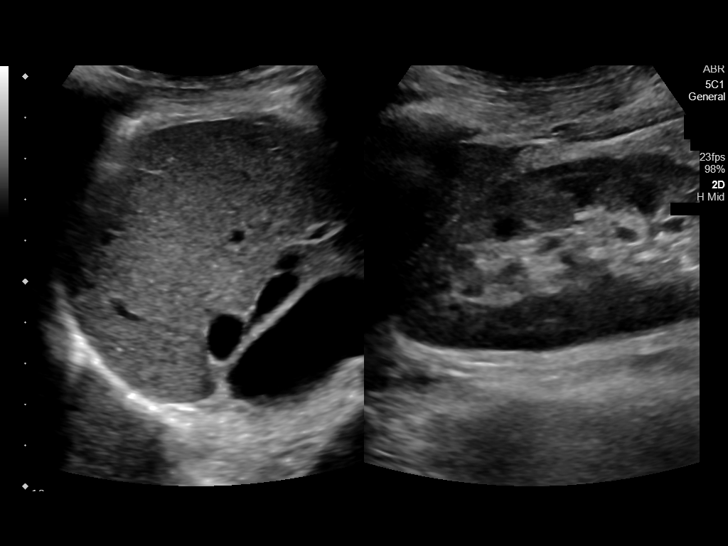

[14 of 25 positions shown; findings below may reference images not displayed]

FINDINGS: Gallbladder:

No gallstones or wall thickening visualized. No sonographic Murphy
sign noted by sonographer.

Common bile duct:

Diameter: 2 mm

Liver:

Increased echogenicity in the liver. No focal mass. Portal vein is
patent on color Doppler imaging with normal direction of blood flow
towards the liver.

Other: Trace ascites adjacent to the liver.
IMPRESSION: 1. Trace ascites adjacent to the liver.
2. Increased echogenicity in the liver may be due to the reported
history of hepatitis or hepatic steatosis.
3. No other abnormalities.

## 2022-03-10 MED ORDER — PROMETHAZINE-DM 6.25-15 MG/5ML PO SYRP: 5.0000 mL | ORAL_SOLUTION | Freq: Four times a day (QID) | ORAL | 0 refills | Status: DC | PRN

## 2022-03-10 MED ORDER — LEVOFLOXACIN 500 MG PO TABS: 500.0000 mg | ORAL_TABLET | Freq: Every day | ORAL | 0 refills | Status: AC

## 2022-03-10 NOTE — Progress Notes (Signed)
     Primary Care / Sports Medicine Office Visit  Patient Information:  Patient ID: Audrey Hayden, female DOB: 1951/01/22 Age: 71 y.o. MRN: [id number]   Audrey Hayden is a pleasant 71 y.o. female presenting with the following:  Chief Complaint  Patient presents with   URI    For 5 days, cough, feels wheezing after coughing a lot, wasn't able to sleep well last night. Has HX of sinus infections. Does have a lot of pressure in her face, negative covid test at home    Vitals:   03/10/22 1331  BP: 118/70  Pulse: 78  Temp: 98.2 F (36.8 C)  SpO2: 96%   Vitals:   03/10/22 1331  Weight: 121 lb 12.8 oz (55.2 kg)  Height: '5\' 8"'$  (1.727 m)   Body mass index is 18.52 kg/m.     Independent interpretation of notes and tests performed by another provider:   None  Procedures performed:   None  Pertinent History, Exam, Impression, and Recommendations:   Problem List Items Addressed This Visit       Respiratory   Bacterial sinusitis - Primary    Patient presents with 5-day history of sinus pressure, thick alternating with thin nasal secretions, and jaw pain. She does endorse mild shortness of air and wheeze, intermittently productive cough.  Denies any fevers or chills, no treatments thus far on her own.  Examination reveals erythematous and swollen turbinates bilaterally, tenderness about the ethmoid sinuses, frontal and maxillary benign, TMJ left-sided tenderness, tympanic membranes and canals partially obscured by dark cerumen, no lymphadenopathy, lung fields reveal inspiratory wheeze at the left upper, left lower, and right lower fields, no rales, no rhonchi, cardiac findings benign.  Given her findings, primary concern for bacterial rhinosinusitis, due to multiple comorbid allergies, prior efficacy, plan for Levaquin course.  I did discuss the risk for tendon rupture, supportive care, and close monitoring.  Additional Rx for as needed cough medicine, recommendations  for rest, hydration, and Mucinex.  She is to contact us towards the end of the week if suboptimal progress noted and otherwise follow-up as needed.      Relevant Medications   promethazine-dextromethorphan (PROMETHAZINE-DM) 6.25-15 MG/5ML syrup   levofloxacin (LEVAQUIN) 500 MG tablet     Orders & Medications Meds ordered this encounter  Medications   promethazine-dextromethorphan (PROMETHAZINE-DM) 6.25-15 MG/5ML syrup    Sig: Take 5 mLs by mouth 4 (four) times daily as needed for cough.    Dispense:  118 mL    Refill:  0   levofloxacin (LEVAQUIN) 500 MG tablet    Sig: Take 1 tablet (500 mg total) by mouth daily for 7 days.    Dispense:  7 tablet    Refill:  0   No orders of the defined types were placed in this encounter.    Return if symptoms worsen or fail to improve.     Montel Culver, MD   Primary Care Sports [facility]

## 2022-03-10 NOTE — Patient Instructions (Signed)
-   Take Levaquin for full course - Start Mucinex, take twice daily while on Levaquin - Dose cough medicine on an as-needed basis - Take relative rest, stay adequately hydrated, and contact our office this Friday if symptomatic without improvement

## 2022-03-10 NOTE — Assessment & Plan Note (Signed)
Patient presents with 5-day history of sinus pressure, thick alternating with thin nasal secretions, and jaw pain. She does endorse mild shortness of air and wheeze, intermittently productive cough.  Denies any fevers or chills, no treatments thus far on her own.  Examination reveals erythematous and swollen turbinates bilaterally, tenderness about the ethmoid sinuses, frontal and maxillary benign, TMJ left-sided tenderness, tympanic membranes and canals partially obscured by dark cerumen, no lymphadenopathy, lung fields reveal inspiratory wheeze at the left upper, left lower, and right lower fields, no rales, no rhonchi, cardiac findings benign.  Given her findings, primary concern for bacterial rhinosinusitis, due to multiple comorbid allergies, prior efficacy, plan for Levaquin course.  I did discuss the risk for tendon rupture, supportive care, and close monitoring.  Additional Rx for as needed cough medicine, recommendations for rest, hydration, and Mucinex.  She is to contact us towards the end of the week if suboptimal progress noted and otherwise follow-up as needed.

## 2022-03-14 ENCOUNTER — Ambulatory Visit: Payer: Self-pay

## 2022-03-14 NOTE — Telephone Encounter (Signed)
  Chief Complaint: medication reaction Symptoms:  Coughing up blood and heart palpitations and difficulty urinating Frequency: past 2 nights Pertinent Negatives: Patient denies wheezing or coughing as severe Disposition: '[]'$ ED /'[]'$ Urgent Care (no appt availability in office) / '[]'$ Appointment(In office/virtual)/ '[]'$  [facility]/ '[x]'$ Home Care/ '[]'$ Refused Recommended Disposition /'[]'$ [facility]/ '[]'$  Follow-up with PCP Additional Notes: pt was calling to report SE from taking the Levaquin. She said she's taken 4 days of the abx and is going to stop taking it d/t the SE severity. Pt says she has not been wheezing. I asked her if she wanted me to request a different abx since she still has 3 days left of the Levaquin but pt refused. She wants to let it get out of her system and see how she feels. Pt will call to report any further systems if she needs to. No further assistance needed.   Reason for Disposition  [1] Caller has NON-URGENT medicine question about med that PCP prescribed AND [2] triager unable to answer question  Answer Assessment - Initial Assessment Questions 1. NAME of MEDICATION: "What medicine are you calling about?"     Levaquin 2. QUESTION: "What is your question?" (e.g., double dose of medicine, side effect) Pt is wanting to dc the medication d/t SE   3. PRESCRIBING HCP: "Who prescribed it?" Reason: if prescribed by specialist, call should be referred to that group.     Dr. Zigmund Daniel 4. SYMPTOMS: "Do you have any symptoms?"      Coughing up blood and heart palpitations and difficulty urinating 5. SEVERITY: If symptoms are present, ask "Are they mild, moderate or severe?"     moderate  Protocols used: Medication Question Call-A-AH

## 2022-04-15 ENCOUNTER — Ambulatory Visit: Payer: Self-pay

## 2022-04-15 NOTE — Telephone Encounter (Signed)
Patient had shrimp on Thursday and not felt right since. She started having diarrhea on Friday and it hasn't let up. She is concerned about possibly also having c diff from Levaquin that she has been off for several weeks. She has an appointment tomorrow afternoon at 4 with Dr Army Melia. Please assist patient further    Mailbox is full, unable to leave a message.

## 2022-04-15 NOTE — Telephone Encounter (Signed)
Noted   Pt has an appt tomorrow.  KP

## 2022-04-15 NOTE — Telephone Encounter (Signed)
Third attempt to reach pt. Voice mailbox is full, unable to leave a message.

## 2022-04-15 NOTE — Telephone Encounter (Signed)
2nd attempt to return call.   "Mailbox is full and cannot accept messages at this time".

## 2022-04-16 ENCOUNTER — Ambulatory Visit: Payer: Medicare Other | Admitting: Internal Medicine

## 2022-05-12 ENCOUNTER — Ambulatory Visit: Payer: Medicare Other | Admitting: Dermatology

## 2022-05-19 ENCOUNTER — Ambulatory Visit (INDEPENDENT_AMBULATORY_CARE_PROVIDER_SITE_OTHER): Payer: Medicare Other | Admitting: Dermatology

## 2022-05-19 DIAGNOSIS — D225 Melanocytic nevi of trunk: Secondary | ICD-10-CM

## 2022-05-19 DIAGNOSIS — D485 Neoplasm of uncertain behavior of skin: Secondary | ICD-10-CM

## 2022-05-19 DIAGNOSIS — D492 Neoplasm of unspecified behavior of bone, soft tissue, and skin: Secondary | ICD-10-CM

## 2022-05-19 DIAGNOSIS — Z85820 Personal history of malignant melanoma of skin: Secondary | ICD-10-CM

## 2022-05-19 DIAGNOSIS — D3617 Benign neoplasm of peripheral nerves and autonomic nervous system of trunk, unspecified: Secondary | ICD-10-CM | POA: Diagnosis not present

## 2022-05-19 DIAGNOSIS — Z1283 Encounter for screening for malignant neoplasm of skin: Secondary | ICD-10-CM | POA: Diagnosis not present

## 2022-05-19 DIAGNOSIS — L814 Other melanin hyperpigmentation: Secondary | ICD-10-CM

## 2022-05-19 DIAGNOSIS — L578 Other skin changes due to chronic exposure to nonionizing radiation: Secondary | ICD-10-CM

## 2022-05-19 DIAGNOSIS — Q825 Congenital non-neoplastic nevus: Secondary | ICD-10-CM | POA: Diagnosis not present

## 2022-05-19 DIAGNOSIS — L821 Other seborrheic keratosis: Secondary | ICD-10-CM | POA: Diagnosis not present

## 2022-05-19 DIAGNOSIS — L820 Inflamed seborrheic keratosis: Secondary | ICD-10-CM | POA: Diagnosis not present

## 2022-05-19 DIAGNOSIS — D1801 Hemangioma of skin and subcutaneous tissue: Secondary | ICD-10-CM | POA: Diagnosis not present

## 2022-05-19 DIAGNOSIS — D229 Melanocytic nevi, unspecified: Secondary | ICD-10-CM

## 2022-05-19 DIAGNOSIS — I8393 Asymptomatic varicose veins of bilateral lower extremities: Secondary | ICD-10-CM

## 2022-05-19 NOTE — Progress Notes (Signed)
Follow-Up Visit   Subjective  Audrey Hayden is a 71 y.o. female who presents for the following: Follow-up.  The patient presents for 6 month follow-up Total-Body Skin Exam (TBSE) for skin cancer screening and mole check.  The patient has spots, moles and lesions to be evaluated, some may be new or changing and the patient has concerns that these could be cancer. 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 Patient has several spots to check on the chest, right face, left upper arm, and legs.  Some get irritated by clothing.   The following portions of the chart were reviewed this encounter and updated as appropriate:       Review of Systems:  No other skin or systemic complaints except as noted in HPI or Assessment and Plan.  Objective  Well appearing patient in no apparent distress; mood and affect are within normal limits.  A full examination was performed including scalp, head, eyes, ears, nose, lips, neck, chest, axillae, abdomen, back, buttocks, bilateral upper extremities, bilateral lower extremities, hands, feet, fingers, toes, fingernails, and toenails. All findings within normal limits unless otherwise noted below.  lower sternum intermammary 4 mm speckled brown papule         left lower anterior thigh 8 mm medium light brown papule with emerging hair.   right scapula Well healed scar with no evidence of recurrence, no lymphadenopathy.   R lower cheek 2.0 x 1.98m waxy tan patch darker edge     R mid back at braline x 1, L mid back x 1, R popliteal x 1 (3) Stuck on waxy paps with erythema  Central upper abdomen 5.061mflesh pap       Assessment & Plan  Skin cancer screening performed today.. [facility] [employer] Damage - chronic, secondary to cumulative UV radiation exposure/sun exposure over time - diffuse scaly erythematous macules with underlying dyspigmentation - Recommend daily broad spectrum sunscreen SPF 30+ to sun-exposed  areas, reapply every 2 hours as needed.  - Recommend staying in the shade or wearing long sleeves, sun glasses (UVA+UVB protection) and wide brim hats (4-inch brim around the entire circumference of the hat). - Call for new or changing lesions.  Lentigines - Scattered tan macules - Due to sun exposure - Benign-appering, observe - Recommend daily broad spectrum sunscreen SPF 30+ to sun-exposed areas, reapply every 2 hours as needed. - Call for any changes. - face  Seborrheic Keratoses - Stuck-on, waxy, tan-brown papules and/or plaques  - Benign-appearing - Discussed benign etiology and prognosis. - Observe - Call for any changes - face, arms  Melanocytic Nevi - Tan-brown and/or pink-flesh-colored symmetric macules and papules - Benign appearing on exam today - Observation - Call clinic for new or changing moles - Recommend daily use of broad spectrum spf 30+ sunscreen to sun-exposed areas.  - back  Varicose Veins/Spider Veins - Dilated blue, purple or red veins at the lower extremities - Reassured - Smaller vessels can be treated by sclerotherapy (a procedure to inject a medicine into the veins to make them disappear) if desired, but the treatment is not covered by insurance. Larger vessels may be covered if symptomatic and we would refer to vascular surgeon if treatment desired.    Nevus lower sternum intermammary  Vs SK, Stable Benign-appearing.  Observation.  Call clinic for new or changing moles.  Recommend daily use of broad spectrum spf 30+ sunscreen to sun-exposed areas.   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  Observation.  Call clinic for new or changing moles.  Recommend daily use of broad spectrum spf 30+ sunscreen to sun-exposed areas.    History of melanoma right scapula  CLARK/ANATOMIC LEVEL: II, Breslows AT LEAST 0.3 MM, WLE 09/16/21  Clear. Observe for recurrence. Call clinic for new or changing lesions.   Recommend regular skin exams, daily broad-spectrum spf 30+ sunscreen use, and photoprotection.    Seborrheic keratosis R lower cheek  Inflamed seborrheic keratosis (3) R mid back at braline x 1, L mid back x 1, R popliteal x 1  Symptomatic, irritating, patient would like treated.   Destruction of lesion - R mid back at braline x 1, L mid back x 1, R popliteal x 1  Destruction method: cryotherapy   Informed consent: discussed and consent obtained   Lesion destroyed using liquid nitrogen: Yes   Region frozen until ice ball extended beyond lesion: Yes   Outcome: patient tolerated procedure well with no complications   Post-procedure details: wound care instructions given   Additional details:  Prior to procedure, discussed risks of blister formation, small wound, skin dyspigmentation, or rare scar following cryotherapy. Recommend Vaseline ointment to treated areas while healing.   Neoplasm of skin Central upper abdomen  Epidermal / dermal shaving  Lesion diameter (cm):  0.6 Informed consent: discussed and consent obtained   Patient was prepped and draped in usual sterile fashion: area prepped with alcohol. Anesthesia: the lesion was anesthetized in a standard fashion   Anesthetic:  1% lidocaine w/ epinephrine 1-100,000 buffered w/ 8.4% NaHCO3 Instrument used: flexible razor blade   Hemostasis achieved with: pressure, aluminum chloride and electrodesiccation   Outcome: patient tolerated procedure well   Post-procedure details: wound care instructions given   Post-procedure details comment:  Ointment and small bandage applied  Specimen 1 - Surgical pathology Differential Diagnosis: D48.5 Irritated Nevus r/o Atypia  Check Margins: yes 5.66m flesh pap  Symptomatic, irritating, patient would like treated.    Return in about 6 months (around 11/17/2022) for TBSE, Hx of Melanoma.  I, SOthelia Pulling RMA, am acting as scribe for TBrendolyn Patty MD .  Documentation: I have reviewed  the above documentation for accuracy and completeness, and I agree with the above.  TBrendolyn PattyMD

## 2022-05-19 NOTE — Patient Instructions (Addendum)
Wound Care Instructions  Cleanse wound gently with soap and water once a day then pat dry with clean gauze. Apply a thin coat of Petrolatum (petroleum jelly, "Vaseline") over the wound (unless you have an allergy to this). We recommend that you use a new, sterile tube of Vaseline. Do not pick or remove scabs. Do not remove the yellow or white "healing tissue" from the base of the wound.  Cover the wound with fresh, clean, nonstick gauze and secure with paper tape. You may use Band-Aids in place of gauze and tape if the wound is small enough, but would recommend trimming much of the tape off as there is often too much. Sometimes Band-Aids can irritate the skin.  You should call the office for your biopsy report after 1 week if you have not already been contacted.  If you experience any problems, such as abnormal amounts of bleeding, swelling, significant bruising, significant pain, or evidence of infection, please call the office immediately.  FOR ADULT SURGERY PATIENTS: If you need something for pain relief you may take 1 extra strength Tylenol (acetaminophen) AND 2 Ibuprofen (234m each) together every 4 hours as needed for pain. (do not take these if you are allergic to them or if you have a reason you should not take them.) Typically, you may only need pain medication for 1 to 3 days.     Cryotherapy Aftercare  Wash gently with soap and water everyday.   Apply Vaseline and Band-Aid daily until healed.    Seborrheic Keratosis  What causes seborrheic keratoses? Seborrheic keratoses are harmless, common skin growths that first appear during adult life.  As time goes by, more growths appear.  Some people may develop a large number of them.  Seborrheic keratoses appear on both covered and uncovered body parts.  They are not caused by sunlight.  The tendency to develop seborrheic keratoses can be inherited.  They vary in color from skin-colored to gray, brown, or even black.  They can be either  smooth or have a rough, warty surface.   Seborrheic keratoses are superficial and look as if they were stuck on the skin.  Under the microscope this type of keratosis looks like layers upon layers of skin.  That is why at times the top layer may seem to fall off, but the rest of the growth remains and re-grows.    Treatment Seborrheic keratoses do not need to be treated, but can easily be removed in the office.  Seborrheic keratoses often cause symptoms when they rub on clothing or jewelry.  Lesions can be in the way of shaving.  If they become inflamed, they can cause itching, soreness, or burning.  Removal of a seborrheic keratosis can be accomplished by freezing, burning, or surgery. If any spot bleeds, scabs, or grows rapidly, please return to have it checked, as these can be an indication of a skin cancer.   Melanoma ABCDEs  Melanoma is the most dangerous type of skin cancer, and is the leading cause of death from skin disease.  You are more likely to develop melanoma if you: Have light-colored skin, light-colored eyes, or red or blond hair Spend a lot of time in the sun Tan regularly, either outdoors or in a tanning bed Have had blistering sunburns, especially during childhood Have a close family member who has had a melanoma Have atypical moles or large birthmarks  Early detection of melanoma is key since treatment is typically straightforward and cure rates are extremely high  if we catch it early.   The first sign of melanoma is often a change in a mole or a new dark spot.  The ABCDE system is a way of remembering the signs of melanoma.  A for asymmetry:  The two halves do not match. B for border:  The edges of the growth are irregular. C for color:  A mixture of colors are present instead of an even brown color. D for diameter:  Melanomas are usually (but not always) greater than 24m - the size of a pencil eraser. E for evolution:  The spot keeps changing in size, shape, and  color.  Please check your skin once per month between visits. You can use a small mirror in front and a large mirror behind you to keep an eye on the back side or your body.   If you see any new or changing lesions before your next follow-up, please call to schedule a visit.  Please continue daily skin protection including broad spectrum sunscreen SPF 30+ to sun-exposed areas, reapplying every 2 hours as needed when you're outdoors.   Staying in the shade or wearing long sleeves, sun glasses (UVA+UVB protection) and wide brim hats (4-inch brim around the entire circumference of the hat) are also recommended for sun protection.     Due to recent changes in healthcare laws, you may see results of your pathology and/or laboratory studies on MyChart before the doctors have had a chance to review them. We understand that in some cases there may be results that are confusing or concerning to you. Please understand that not all results are received at the same time and often the doctors may need to interpret multiple results in order to provide you with the best plan of care or course of treatment. Therefore, we ask that you please give u[place]2 business days to thoroughly review all your results before contacting the office for clarification. Should we see a critical lab result, you will be contacted sooner.   If You Need Anything After Your Visit  If you have any questions or concerns for your doctor, please call our main line at [phone] press option 4 to reach your doctor's medical assistant. If no one answers, please leave a voicemail as directed and we will return your call as soon as possible. Messages left after 4 pm will be answered the following business day.   You may also send u[place] message via [place] We typically respond to MyChart messages within 1-2 business days.  For prescription refills, please ask your pharmacy to contact our office. Our fax number is [phone]  If you have  an urgent issue when the clinic is closed that cannot wait until the next business day, you can page your doctor at the number below.    Please note that while we do our best to be available for urgent issues outside of office hours, we are not available 24/7.   If you have an urgent issue and are unable to reach u[place] you may choose to seek medical care at your doctor's office, retail clinic, urgent care center, or emergency room.  If you have a medical emergency, please immediately call 911 or go to the emergency department.  Pager Numbers  - Dr. KNehemiah Massed [phone] - Dr. MLaurence Ferrari [phone] - Dr. SNicole Kindred [phone] In the event of inclement weather, please call our main line at [phone] an update on the status of any delays or closures.  Dermatology Medication Tips: Please keep the boxes that topical medications come in in order to help keep track of the instructions about where and how to use these. Pharmacies typically print the medication instructions only on the boxes and not directly on the medication tubes.   If your medication is too expensive, please contact our office at [phone] option 4 or send [place] a message through [place].   We are unable to tell what your co-pay for medications will be in advance as this is different depending on your insurance coverage. However, we may be able to find a substitute medication at lower cost or fill out paperwork to get insurance to cover a needed medication.   If a prior authorization is required to get your medication covered by your insurance company, please allow [place] 1-2 business days to complete this process.  Drug prices often vary depending on where the prescription is filled and some pharmacies may offer cheaper prices.  The website www.goodrx.com contains coupons for medications through different pharmacies. The prices here do not account for what the cost may be with help from insurance (it may be cheaper with  your insurance), but the website can give you the price if you did not use any insurance.  - You can print the associated coupon and take it with your prescription to the pharmacy.  - You may also stop by our office during regular business hours and pick up a GoodRx coupon card.  - If you need your prescription sent electronically to a different pharmacy, notify our office through [facility] or by phone at [phone] option 4.     Si Usted Necesita Algo Despus de Su Visita  Tambin puede enviarnos un mensaje a travs de Pharmacist, community. Por lo general respondemos a los mensajes de MyChart en el transcurso de 1 a 2 das hbiles.  Para renovar recetas, por favor pida a su farmacia que se ponga en contacto con nuestra oficina. Harland Dingwall de fax es [place] [phone].  Si tiene un asunto urgente cuando la clnica est cerrada y que no puede esperar hasta el siguiente da hbil, puede llamar/localizar a su doctor(a) al nmero que aparece a continuacin.   Por favor, tenga en cuenta que aunque hacemos todo lo posible para estar disponibles para asuntos urgentes fuera del horario de [place], no estamos disponibles las 24 horas del da, los 7 das de la [place].   Si tiene un problema urgente y no puede comunicarse con nosotros, puede optar por buscar atencin mdica  en el consultorio de su doctor(a), en una clnica privada, en un centro de atencin urgente o en una sala de emergencias.  Si tiene Engineering geologist, por favor llame inmediatamente al 911 o vaya a la sala de emergencias.  Nmeros de bper  - Dr. Nehemiah Massed: [phone]  - Dra. Moye: [phone]  - Dra. Nicole Kindred: [phone]  En caso de inclemencias del [place], por favor llame a Johnsie Kindred principal al [phone] para una actualizacin sobre el [place] de cualquier retraso o cierre.  Consejos para la medicacin en dermatologa: Por favor, guarde las cajas en las que vienen los medicamentos de uso tpico para  ayudarle a seguir las instrucciones sobre dnde y cmo usarlos. Las farmacias generalmente imprimen las instrucciones del medicamento slo en las cajas y no directamente en los tubos del [place].   Si su medicamento es muy caro, por favor, pngase en contacto con Zigmund Daniel llamando al [phone] y presione la opcin 4 o envenos un  mensaje a travs de MyChart.   No podemos decirle cul ser su copago por los medicamentos por adelantado ya que esto es diferente dependiendo de la cobertura de su seguro. Sin embargo, es posible que podamos encontrar un medicamento sustituto a Electrical engineer un formulario para que el seguro cubra el medicamento que se considera necesario.   Si se requiere una autorizacin previa para que su compaa de seguros [place] su medicamento, por favor permtanos de 1 a 2 das hbiles para completar este proceso.  Los precios de los medicamentos varan con frecuencia dependiendo del Environmental consultant de dnde se surte la receta y alguna farmacias pueden ofrecer precios ms baratos.  El sitio web www.goodrx.com tiene cupones para medicamentos de Airline pilot. Los precios aqu no tienen en cuenta lo que podra costar con la ayuda del seguro (puede ser ms barato con su seguro), pero el sitio web puede darle el precio si no utiliz Research scientist (physical sciences).  - Puede imprimir el cupn correspondiente y llevarlo con su receta a la farmacia.  - Tambin puede pasar por nuestra oficina durante el horario de atencin regular y Charity fundraiser una tarjeta de cupones de GoodRx.  - Si necesita que su receta se enve electrnicamente a una farmacia diferente, informe a nuestra oficina a travs de MyChart de [place] o por telfono llamando al [phone] y presione la opcin 4.

## 2022-05-20 ENCOUNTER — Telehealth: Payer: Self-pay

## 2022-05-20 NOTE — Telephone Encounter (Signed)
Advised pt of bx results/sh ?

## 2022-05-20 NOTE — Telephone Encounter (Signed)
-----   Message from Brendolyn Patty, MD sent at 05/20/2022  5:40 PM EDT ----- Skin , central upper abdomen NEUROFIBROMA, BASE INVOLVED  Benign growth of nerve "insulation" tissue, may recur   - please call patient

## 2022-05-30 ENCOUNTER — Telehealth: Payer: Self-pay | Admitting: Family Medicine

## 2022-05-30 ENCOUNTER — Telehealth: Payer: Self-pay | Admitting: Internal Medicine

## 2022-05-30 NOTE — Telephone Encounter (Signed)
Pt is calling to reschedule her AWV to October in which she can receive her flu shot at the same time. [place]- [phone]

## 2022-05-30 NOTE — Telephone Encounter (Signed)
Pt wants to make an appt to get the flu shot. She usually gets the [place] and that is the one she would prefer, but last year she got the regular flu shot and had no reaction. She would like for someone from the clinic to call her, to discuss the possibility of switching to the regular flu shot and the potential risks. Thanks

## 2022-06-03 ENCOUNTER — Ambulatory Visit (INDEPENDENT_AMBULATORY_CARE_PROVIDER_SITE_OTHER): Payer: Medicare Other

## 2022-06-03 DIAGNOSIS — Z23 Encounter for immunization: Secondary | ICD-10-CM

## 2022-06-12 NOTE — Telephone Encounter (Signed)
Phone call to patient. States she has no questions related to flu vaccine at this time. She explains she received flu vaccine from her PCP recently. Josie Saunders, RN

## 2022-07-11 ENCOUNTER — Encounter: Payer: Self-pay | Admitting: Internal Medicine

## 2022-07-11 ENCOUNTER — Ambulatory Visit (INDEPENDENT_AMBULATORY_CARE_PROVIDER_SITE_OTHER): Payer: Medicare Other | Admitting: Internal Medicine

## 2022-07-11 VITALS — BP 110/70 | HR 84 | Ht 68.0 in | Wt 126.0 lb

## 2022-07-11 DIAGNOSIS — M62830 Muscle spasm of back: Secondary | ICD-10-CM | POA: Diagnosis not present

## 2022-07-11 MED ORDER — METHOCARBAMOL 500 MG PO TABS: 500.0000 mg | ORAL_TABLET | Freq: Three times a day (TID) | ORAL | 1 refills | Status: DC | PRN

## 2022-07-11 NOTE — Progress Notes (Signed)
Date:  07/11/2022   Name:  Audrey Hayden   DOB:  July 10, 1951   MRN:  [id number]   Chief Complaint: Back Pain  Back Pain This is a new problem. Episode onset: X6 days. The problem occurs constantly. The problem has been gradually worsening since onset. Pain location: lower back left side. The pain does not radiate. The pain is at a severity of 10/10. The pain is mild. The pain is The same all the time. The symptoms are aggravated by bending, lying down, position, standing, sitting and twisting. Associated symptoms include headaches. Pertinent negatives include no chest pain or fever. Treatments tried: osteo bi flex. The treatment provided mild relief.    Lab Results  Component Value Date   NA 137 03/27/2021   K 3.9 03/27/2021   CO2 28 03/27/2021   GLUCOSE 94 03/27/2021   BUN 16 03/27/2021   CREATININE 0.76 03/27/2021   CALCIUM 9.4 03/27/2021   GFRNONAA >60 03/27/2021   Lab Results  Component Value Date   CHOL 189 03/20/2021   TRIG 60 03/20/2021   Lab Results  Component Value Date   TSH 2.130 11/19/2017   Lab Results  Component Value Date   HGBA1C 5.6 08/21/2021   Lab Results  Component Value Date   WBC 6.6 03/27/2021   HGB 13.8 03/27/2021   HCT 41.6 03/27/2021   MCV 92.4 03/27/2021   PLT 187 03/27/2021   Lab Results  Component Value Date   ALT 37 03/26/2021   AST 34 03/26/2021   ALKPHOS 85 03/26/2021   BILITOT 1.4 (H) 03/26/2021   Lab Results  Component Value Date   VD25OH 127.0 (H) 08/21/2021     Review of Systems  Constitutional:  Negative for chills, fatigue and fever.  Respiratory:  Negative for chest tightness.   Cardiovascular:  Negative for chest pain and leg swelling.  Gastrointestinal:  Negative for abdominal distention.  Genitourinary:  Negative for difficulty urinating.  Musculoskeletal:  Positive for back pain and gait problem.  Neurological:  Positive for headaches.  Psychiatric/Behavioral:  Negative for dysphoric mood and sleep  disturbance. The patient is not nervous/anxious.     Patient Active Problem List   Diagnosis Date Noted   Bacterial sinusitis 03/10/2022   Loose stools    Loss of weight    Thyroid nodule 05/03/2021   Allergic rhinitis 04/11/2021   Adverse reaction to food 04/11/2021   Esophageal dysphagia    Schatzki's ring    Gastric erythema    Bronchiectasis ([place]) 08/10/2020   Asymptomatic spider veins of both lower extremities 02/25/2019   Chronic left-sided thoracic back pain 12/09/2017   Anxiety 12/09/2017   Environmental and seasonal allergies 06/19/2015   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/19/2015   Osteoporosis 06/19/2015   Abnormal Pap smear of cervix 06/19/2015   Elevated HDL 06/19/2015   History of gastritis 11/08/2014   Cough, persistent 08/14/2014    Allergies  Allergen Reactions   Eggs Or Egg-Derived Products Itching and Rash   Amoxicillin Other (See Comments)    Tingling around the mouth   Azelastine Hcl Other (See Comments)   Cefdinir     Other reaction(s): Other (See Comments) Bright red face and ankle swelling: possible reaction to the medicine.    Clarithromycin Other (See Comments)    Causes her to faint.   Codeine Other (See Comments)    Causes her GI upset.   Hydromorphone Hcl Other (See Comments)    Unsure of reaction  Hydromorphone Hcl Other (See Comments)   Ibuprofen Other (See Comments)   Meperidine Other (See Comments)    Unsure of reaction   Montelukast Other (See Comments)   Moxifloxacin Other (See Comments)    Dizziness    Nsaids     Other reaction(s): Unknown   Omeprazole Other (See Comments)    Ears ringing with more than 44m/day   Other Other (See Comments)    All profins cause her to faint. All the mycins cause her to faint.   Prednisone Other (See Comments)    Bright red face and ankle swelling: possible reaction to the medicine.   Pseudoephedrine Other (See Comments)   Pseudoephedrine Hcl     Other reaction(s): Syncope    Ranitidine     Other reaction(s): Other (See Comments) SE noted with high dosage Dk urine and stools, HA   Salicylates Hives   Aspirin Hives and Rash   Dexilant [Dexlansoprazole] Rash    Peeling skin   Doxycycline Other (See Comments)    Headache. "Pressure on the brain''   Montelukast Sodium Palpitations and Other (See Comments)    Past Surgical History:  Procedure Laterality Date   BREAST BIOPSY Left    neg   COLONOSCOPY  2004   COLONOSCOPY WITH PROPOFOL N/A 06/19/2021   Procedure: COLONOSCOPY WITH PROPOFOL;  Surgeon: TVirgel Manifold MD;  Location: [facility] ENDOSCOPY;  Service: Endoscopy;  Laterality: N/A;   ESOPHAGOGASTRODUODENOSCOPY (EGD) WITH PROPOFOL N/A 12/05/2020   Procedure: ESOPHAGOGASTRODUODENOSCOPY (EGD) WITH PROPOFOL;  Surgeon: TVirgel Manifold MD;  Location: [facility] ENDOSCOPY;  Service: Endoscopy;  Laterality: N/A;   TONSILLECTOMY     UPPER GI ENDOSCOPY  12/2018    Social History   Tobacco Use   Smoking status: Former    Packs/day: 1.50    Years: 5.00    Total pack years: 7.50    Types: Cigarettes    Start date: 89   Quit date: 04/02/1975    Years since quitting: 47.3   Smokeless tobacco: Never   Tobacco comments:    smoking cessation materials not required  Vaping Use   Vaping Use: Never used  Substance Use Topics   Alcohol use: No    Alcohol/week: 0.0 standard drinks of alcohol   Drug use: No     Medication list has been reviewed and updated.  Current Meds  Medication Sig   Boswellia-Glucosamine-Vit D (OSTEO BI-FLEX ONE PER DAY PO) Take by mouth.   cetirizine (ZYRTEC) 10 MG tablet Take 10 mg by mouth daily.   Homeopathic Products (LIVER SUPPORT SL) Place under the tongue.   LYSINE PO Take by mouth.   Melatonin ([employer] MELATONIN GUMMIES) 1 MG CHEW Chew by mouth.   Multiple Vitamins-Minerals (ALIVE WOMENS 50+ PO)    Pancreatin 500 MG CAPS Take by mouth. Digestive enzymes   Probiotic Product (PROBIOTIC BLEND PO) in the morning, at noon, and at  bedtime.   Quercetin 250 MG TABS Take 500 mg by mouth at bedtime.   [DISCONTINUED] mupirocin ointment (BACTROBAN) 2 % Apply to affected area once daily with bandage change.   [DISCONTINUED] promethazine-dextromethorphan (PROMETHAZINE-DM) 6.25-15 MG/5ML syrup Take 5 mLs by mouth 4 (four) times daily as needed for cough.       07/11/2022   10:41 AM 03/10/2022    1:34 PM 09/06/2021   11:08 AM 08/21/2021   10:09 AM  GAD 7 : Generalized Anxiety Score  Nervous, Anxious, on Edge 0 0 0 0  Control/stop worrying 0 0 0 0  Worry too much - different things 0 0 0 0  Trouble relaxing 0 0 1 1  Restless 0 0 0 0  Easily annoyed or irritable 0 0 0 0  Afraid - awful might happen 0 0 0 0  Total GAD 7 Score 0 0 1 1  Anxiety Difficulty Not difficult at all Not difficult at all  Not difficult at all       07/11/2022   10:41 AM 03/10/2022    1:34 PM 09/06/2021   11:08 AM  Depression screen PHQ 2/9  Decreased Interest 0 0 0  Down, Depressed, Hopeless 0 0 0  PHQ - 2 Score 0 0 0  Altered sleeping 0 1 1  Tired, decreased energy 0 0 2  Change in appetite 0 0 0  Feeling bad or failure about yourself  0 0 0  Trouble concentrating 0 0 0  Moving slowly or fidgety/restless 0 0 0  Suicidal thoughts 0 0 0  PHQ-9 Score 0 1 3  Difficult doing work/chores Not difficult at all Not difficult at all     BP Readings from Last 3 Encounters:  07/11/22 110/70  03/10/22 118/70  12/17/21 114/69    Physical Exam Vitals and nursing note reviewed.  Constitutional:      General: She is not in acute distress.    Appearance: Normal appearance. She is well-developed.  HENT:     Head: Normocephalic and atraumatic.  Cardiovascular:     Rate and Rhythm: Normal rate and regular rhythm.  Pulmonary:     Effort: Pulmonary effort is normal. No respiratory distress.     Breath sounds: No wheezing or rhonchi.  Musculoskeletal:     Lumbar back: Spasms and tenderness present. Decreased range of motion. Positive right straight  leg raise test.       Back:  Skin:    General: Skin is warm and dry.     Findings: No rash.  Neurological:     Mental Status: She is alert and oriented to person, place, and time.     Sensory: Sensation is intact.     Motor: Motor function is intact.     Deep Tendon Reflexes:     Reflex Scores:      Patellar reflexes are 2+ on the right side and 2+ on the left side.      Achilles reflexes are 2+ on the right side and 2+ on the left side. Psychiatric:        Mood and Affect: Mood normal.        Behavior: Behavior normal.     Wt Readings from Last 3 Encounters:  07/11/22 126 lb (57.2 kg)  03/10/22 121 lb 12.8 oz (55.2 kg)  12/17/21 113 lb 8 oz (51.5 kg)    BP 110/70   Pulse 84   Ht 5' 8"  (1.727 m)   Wt 126 lb (57.2 kg)   SpO2 96%   BMI 19.16 kg/m   Assessment and Plan: 1. Back muscle spasm No indication for imaging at this time. Stop Osteo-bioflex Use heat for 30 min 3-4 times per day Tylenol 650 mg every 8 hours with Robaxin Can also use Lidocaine patches otc if needed - methocarbamol (ROBAXIN) 500 MG tablet; Take 1 tablet (500 mg total) by mouth every 8 (eight) hours as needed for muscle spasms.  Dispense: 30 tablet; Refill: 1   Partially dictated using Editor, commissioning. Any errors are unintentional.  Halina Maidens, MD [facility]  07/11/2022      

## 2022-07-11 NOTE — Patient Instructions (Signed)
Robaxin three times a day for at least 2 days and then as needed  Take 2 Tylenol with each Robaxin dose  Use heat for 30 min 4 times a day  Can also use over the counter Lidoderm patches if desired.

## 2022-07-14 ENCOUNTER — Telehealth: Payer: Self-pay | Admitting: Internal Medicine

## 2022-07-14 NOTE — Telephone Encounter (Signed)
Noted. - Audrey Hayden

## 2022-07-14 NOTE — Telephone Encounter (Signed)
Copied from [place] [id number]. Topic: General - Other >> Jul 14, 2022  9:11 AM Leitha Schuller wrote: Pt making provider aware methocarbamol (ROBAXIN) 500 MG tablet is helping w/ back pain

## 2022-07-16 ENCOUNTER — Ambulatory Visit (INDEPENDENT_AMBULATORY_CARE_PROVIDER_SITE_OTHER): Payer: Medicare Other

## 2022-07-16 VITALS — Ht 68.0 in | Wt 126.0 lb

## 2022-07-16 DIAGNOSIS — Z0000 Encounter for general adult medical examination without abnormal findings: Secondary | ICD-10-CM | POA: Diagnosis not present

## 2022-07-16 NOTE — Progress Notes (Signed)
Subjective:   Audrey Hayden is a 71 y.o. female who presents for Medicare Annual (Subsequent) preventive examination.  I connected with  Audrey Hayden on 07/16/22 by a audio enabled telemedicine application and verified that I am speaking with the correct person using two identifiers.  Patient Location: Home  Provider Location: Office/Clinic  I discussed the limitations of evaluation and management by telemedicine. The patient expressed understanding and agreed to proceed.   Review of Systems    Defer to PCP Cardiac Risk Factors include: advanced age (>78mn, >>59women)     Objective:    Today's Vitals   07/16/22 1357  Weight: 126 lb (57.2 kg)  Height: 5' 8"  (1.727 m)   Body mass index is 19.16 kg/m.     07/16/2022    2:03 PM 07/15/2021    1:38 PM 06/19/2021    8:19 AM 03/26/2021   11:17 AM 03/01/2021   10:44 AM 12/05/2020    7:46 AM 07/09/2020   11:14 AM  Advanced Directives  Does Patient Have a Medical Advance Directive? Yes Yes Yes Yes Yes No Yes  Type of ACorporate treasurerof [place] will [place] will Living will Living will;Healthcare Power of [place]Living will  Does patient want to make changes to medical advance directive?    No - Patient declined     Copy of HLattyin Chart?  No - copy requested No - copy requested    No - copy requested  Would patient like information on creating a medical advance directive? No - Guardian declined   No - Patient declined  No - Patient declined     Current Medications (verified) Outpatient Encounter Medications as of 07/16/2022  Medication Sig   Boswellia-Glucosamine-Vit D (OSTEO BI-FLEX ONE PER DAY PO) Take by mouth.   cetirizine (ZYRTEC) 10 MG tablet Take 10 mg by mouth daily.   Homeopathic Products (LIVER SUPPORT SL) Place under the tongue.   LYSINE PO Take by mouth.   Melatonin ([employer] MELATONIN GUMMIES) 1 MG  CHEW Chew by mouth.   methocarbamol (ROBAXIN) 500 MG tablet Take 1 tablet (500 mg total) by mouth every 8 (eight) hours as needed for muscle spasms.   Multiple Vitamins-Minerals (ALIVE WOMENS 50+ PO)    Pancreatin 500 MG CAPS Take by mouth. Digestive enzymes   Probiotic Product (PROBIOTIC BLEND PO) in the morning, at noon, and at bedtime.   Quercetin 250 MG TABS Take 500 mg by mouth at bedtime.   No facility-administered encounter medications on file as of 07/16/2022.    Allergies (verified) Eggs or egg-derived products, Amoxicillin, Azelastine hcl, Cefdinir, Clarithromycin, Codeine, Hydromorphone hcl, Hydromorphone hcl, Ibuprofen, Meperidine, Montelukast, Moxifloxacin, Nsaids, Omeprazole, Other, Prednisone, Pseudoephedrine, Pseudoephedrine hcl, Ranitidine, Salicylates, Aspirin, Dexilant [dexlansoprazole], Doxycycline, and Montelukast sodium   History: Past Medical History:  Diagnosis Date   Allergy    Bronchiectasis ([place]    Celiac disease    GERD (gastroesophageal reflux disease)    Gross hematuria 02/25/2019   IBS (irritable bowel syndrome)    Melanoma ([place] 09/03/2021   R scapula, CLARK/ANATOMIC LEVEL: II, Breslows AT LEAST 0.3 MM, WLE 09/16/21   Osteoporosis    Past Surgical History:  Procedure Laterality Date   BREAST BIOPSY Left    neg   COLONOSCOPY  2004   COLONOSCOPY WITH PROPOFOL N/A 06/19/2021   Procedure: COLONOSCOPY WITH PROPOFOL;  Surgeon: TVirgel Manifold MD;  Location: [facility] ENDOSCOPY;  Service:  Endoscopy;  Laterality: N/A;   ESOPHAGOGASTRODUODENOSCOPY (EGD) WITH PROPOFOL N/A 12/05/2020   Procedure: ESOPHAGOGASTRODUODENOSCOPY (EGD) WITH PROPOFOL;  Surgeon: Virgel Manifold, MD;  Location: [facility] ENDOSCOPY;  Service: Endoscopy;  Laterality: N/A;   TONSILLECTOMY     UPPER GI ENDOSCOPY  12/2018   Family History  Problem Relation Age of Onset   Hypertension Mother    CAD Father    Heart attack Father    Breast cancer Neg Hx    Colon cancer Neg Hx     Social History   Socioeconomic History   Marital status: Married    Spouse name: Not on file   Number of children: 2   Years of education: Not on file   Highest education level: Bachelor's degree (e.g., BA, AB, BS)  Occupational History   Occupation: Retired  Tobacco Use   Smoking status: Former    Packs/day: 1.50    Years: 5.00    Total pack years: 7.50    Types: Cigarettes    Start date: 70    Quit date: 04/02/1975    Years since quitting: 47.3   Smokeless tobacco: Never   Tobacco comments:    smoking cessation materials not required  Vaping Use   Vaping Use: Never used  Substance and Sexual Activity   Alcohol use: No    Alcohol/week: 0.0 standard drinks of alcohol   Drug use: No   Sexual activity: Not Currently  Other Topics Concern   Not on file  Social History Narrative   Not on file   Social Determinants of Health   Financial Resource Strain: Low Risk  (07/11/2022)   Overall Financial Resource Strain (CARDIA)    Difficulty of Paying Living Expenses: Not hard at all  Food Insecurity: No Food Insecurity (07/16/2022)   Hunger Vital Sign    Worried About Running Out of Food in the Last Year: Never true    Ran Out of Food in the Last Year: Never true  Transportation Needs: No Transportation Needs (07/11/2022)   PRAPARE - Hydrologist (Medical): No    Lack of Transportation (Non-Medical): No  Physical Activity: Sufficiently Active (07/16/2022)   Exercise Vital Sign    Days of Exercise per Week: 7 days    Minutes of Exercise per Session: 60 min  Stress: No Stress Concern Present (07/16/2022)   [place]    Feeling of Stress : Not at all  Social Connections: Moderately Isolated (07/16/2022)   Social Connection and Isolation Panel [NHANES]    Frequency of Communication with Friends and Family: Three times a week    Frequency of Social Gatherings with Friends and  Family: Three times a week    Attends Religious Services: Never    Active Member of Clubs or Organizations: No    Attends Archivist Meetings: Never    Marital Status: Married    Tobacco Counseling Counseling given: Not Answered Tobacco comments: smoking cessation materials not required   Clinical Intake:  Pre-visit preparation completed: Yes  Pain : No/denies pain     Diabetes: No     Diabetic? No.         Activities of Daily Living    07/16/2022    2:03 PM 09/06/2021   11:09 AM  In your present state of health, do you have any difficulty performing the following activities:  Hearing?  0  Vision? 0 0  Difficulty concentrating or making decisions? 0  0  Walking or climbing stairs? 0 0  Dressing or bathing? 0 0  Doing errands, shopping? 0 0  Preparing Food and eating ? N   Using the Toilet? N   In the past six months, have you accidently leaked urine? N   Do you have problems with loss of bowel control? N   Managing your Medications? N   Managing your Finances? N   Housekeeping or managing your Housekeeping? N     Patient Care Team: Glean Hess, MD as PCP - General (Internal Medicine) Benjaman Kindler, MD as Consulting Physician (Obstetrics and Gynecology) Erby Pian, MD as Referring Physician (Pulmonary Disease) Gabriel Carina Betsey Holiday, MD as Physician Assistant (Endocrinology)  Indicate any recent Medical Services you may have received from other than [facility] providers in the past year (date may be approximate).     Assessment:   This is a routine wellness examination for Parks.  Hearing/Vision screen Hearing Screening - Comments:: No concerns. Vision Screening - Comments:: Wears glasses.  Dietary issues and exercise activities discussed: Current Exercise Habits: Home exercise routine, Type of exercise: walking, Time (Minutes): 30, Frequency (Times/Week): >7, Weekly Exercise (Minutes/Week): 0, Intensity: Moderate   Goals Addressed    None   Depression Screen    07/16/2022    2:02 PM 07/11/2022   10:41 AM 03/10/2022    1:34 PM 09/06/2021   11:08 AM 08/21/2021   10:09 AM 07/15/2021    1:37 PM 05/28/2021    2:26 PM  PHQ 2/9 Scores  PHQ - 2 Score 0 0 0 0 0 0 0  PHQ- 9 Score 1 0 1 3 1 1 4     Fall Risk    07/16/2022    2:03 PM 07/11/2022   10:41 AM 03/10/2022    1:34 PM 09/06/2021   11:09 AM 08/21/2021   10:09 AM  Fall Risk   Falls in the past year? 0 0 0 1 1  Number falls in past yr: 0 0  1 1  Injury with Fall? 0 0 0 0 0  Risk for fall due to : No Fall Risks No Fall Risks  No Fall Risks History of fall(s)  Follow up Falls evaluation completed Falls evaluation completed  Falls evaluation completed Falls evaluation completed    FALL RISK PREVENTION PERTAINING TO THE HOME:  Any stairs in or around the home? No  If so, are there any without handrails?  N/A Home free of loose throw rugs in walkways, pet beds, electrical cords, etc? Yes  Adequate lighting in your home to reduce risk of falls? Yes   ASSISTIVE DEVICES UTILIZED TO PREVENT FALLS:  Life alert? No  Use of a cane, walker or w/c? No  Grab bars in the bathroom? Yes  Shower chair or bench in shower? No  Elevated toilet seat or a handicapped toilet? No    Cognitive Function:        07/16/2022    2:04 PM 04/26/2018    2:50 PM 04/20/2017    4:02 PM  6CIT Screen  What Year? 0 points 0 points 0 points  What month? 0 points 0 points 0 points  What time? 0 points 0 points 0 points  Count back from 20 0 points 0 points 0 points  Months in reverse 0 points 0 points 0 points  Repeat phrase 0 points 0 points 0 points  Total Score 0 points 0 points 0 points    Immunizations Immunization History  Administered Date(s) Administered  Fluad Quad(high Dose 65+) 05/03/2021, 06/03/2022   Hep A / Hep B 05/15/2021, 06/17/2021, 11/14/2021   Influenza, Quadrivalent, Recombinant, Inj, Pf 07/21/2019, 06/12/2020   Influenza-Unspecified 07/06/2015    PFIZER(Purple Top)SARS-COV-2 Vaccination 10/21/2019, 11/11/2019   Pneumococcal Conjugate-13 11/09/2017   Tdap 10/27/2018    TDAP status: Up to date  Flu Vaccine status: Up to date  Pneumococcal vaccine status: Up to date  Covid-19 vaccine status: Completed vaccines  Qualifies for Shingles Vaccine? Yes   Zostavax completed No   Shingrix Completed?: No.    Education has been provided regarding the importance of this vaccine. Patient has been advised to call insurance company to determine out of pocket expense if they have not yet received this vaccine. Advised may also receive vaccine at local pharmacy or Health Dept. Verbalized acceptance and understanding.  Screening Tests Health Maintenance  Topic Date Due   Zoster Vaccines- Shingrix (1 of 2) Never done   Pneumonia Vaccine 89+ Years old (2 - PPSV23 or PCV20) 11/10/2018   COVID-19 Vaccine (3 - Pfizer risk series) 07/27/2022 (Originally 12/09/2019)   MAMMOGRAM  02/01/2023   Medicare Annual Wellness (AWV)  07/17/2023   TETANUS/TDAP  10/27/2028   COLONOSCOPY (Pts 45-34yr Insurance coverage will need to be confirmed)  06/20/2031   INFLUENZA VACCINE  Completed   DEXA SCAN  Completed   Hepatitis C Screening  Completed   HPV VACCINES  Aged Out    Health Maintenance  Health Maintenance Due  Topic Date Due   Zoster Vaccines- Shingrix (1 of 2) Never done   Pneumonia Vaccine 72 Years old (2 - PPSV23 or PCV20) 11/10/2018    Colorectal cancer screening: Type of screening: Colonoscopy. Completed 06/19/2021. Repeat every 10 years  Mammogram status: Completed 01/31/2022. Repeat every year   Lung Cancer Screening: (Low Dose CT Chest recommended if Age 72-80years, 30 pack-year currently smoking OR have quit w/in 15years.) does not qualify.    Additional Screening:  Hepatitis C Screening: does qualify; Completed 05/08/2021  Vision Screening: Recommended annual ophthalmology exams for early detection of glaucoma and other disorders of  the eye. Is the patient up to date with their annual eye exam?  Yes  Who is the provider or what is the name of the office in which the patient attends annual eye exams[place] [place] If pt is not established with a provider, would they like to be referred to a provider to establish care?  N/A .   Dental Screening: Recommended annual dental exams for proper oral hygiene  Community Resource Referral / Chronic Care Management: CRR required this visit?  No   CCM required this visit?  No      Plan:     I have personally reviewed and noted the following in the patient's chart:   Medical and social history Use of alcohol, tobacco or illicit drugs  Current medications and supplements including opioid prescriptions. Patient is not currently taking opioid prescriptions. Functional ability and status Nutritional status Physical activity Advanced directives List of other physicians Hospitalizations, surgeries, and ER visits in previous 12 months Vitals Screenings to include cognitive, depression, and falls Referrals and appointments  In addition, I have reviewed and discussed with patient certain preventive protocols, quality metrics, and best practice recommendations. A written personalized care plan for preventive services as well as general preventive health recommendations were provided to patient.     CClista Bernhardt CWarwick  07/16/2022    Ms. SFickett, Thank you for taking time  to come for your Medicare Wellness Visit. I appreciate your ongoing commitment to your health goals. Please review the following plan we discussed and let me know if I can assist you in the future.   These are the goals we discussed:  Goals      DIET - INCREASE LEAN PROTEINS     Recommend to drink 1-2 protein shakes per day     DIET - INCREASE WATER INTAKE     Recommend to drink at least 6-8 8oz glasses of water per day.        This is a list of the screening recommended for you  and due dates:  Health Maintenance  Topic Date Due   Zoster (Shingles) Vaccine (1 of 2) Never done   Pneumonia Vaccine (2 - PPSV23 or PCV20) 11/10/2018   COVID-19 Vaccine (3 - Pfizer risk series) 07/27/2022*   Mammogram  02/01/2023   Medicare Annual Wellness Visit  07/17/2023   Tetanus Vaccine  10/27/2028   Colon Cancer Screening  06/20/2031   Flu Shot  Completed   DEXA scan (bone density measurement)  Completed   Hepatitis C Screening: USPSTF Recommendation to screen - Ages 18-79 yo.  Completed   HPV Vaccine  Aged Out  *Topic was postponed. The date shown is not the original due date.     Nurse Notes: None.

## 2022-10-28 DIAGNOSIS — J3089 Other allergic rhinitis: Secondary | ICD-10-CM | POA: Diagnosis not present

## 2022-10-28 DIAGNOSIS — R052 Subacute cough: Secondary | ICD-10-CM | POA: Diagnosis not present

## 2022-10-28 DIAGNOSIS — T781XXD Other adverse food reactions, not elsewhere classified, subsequent encounter: Secondary | ICD-10-CM | POA: Diagnosis not present

## 2022-11-18 ENCOUNTER — Ambulatory Visit (INDEPENDENT_AMBULATORY_CARE_PROVIDER_SITE_OTHER): Payer: Medicare Other | Admitting: Dermatology

## 2022-11-18 VITALS — BP 98/69 | HR 63

## 2022-11-18 DIAGNOSIS — Q825 Congenital non-neoplastic nevus: Secondary | ICD-10-CM

## 2022-11-18 DIAGNOSIS — L817 Pigmented purpuric dermatosis: Secondary | ICD-10-CM

## 2022-11-18 DIAGNOSIS — Z85820 Personal history of malignant melanoma of skin: Secondary | ICD-10-CM

## 2022-11-18 DIAGNOSIS — I8393 Asymptomatic varicose veins of bilateral lower extremities: Secondary | ICD-10-CM

## 2022-11-18 DIAGNOSIS — Z1283 Encounter for screening for malignant neoplasm of skin: Secondary | ICD-10-CM

## 2022-11-18 DIAGNOSIS — D225 Melanocytic nevi of trunk: Secondary | ICD-10-CM

## 2022-11-18 DIAGNOSIS — D229 Melanocytic nevi, unspecified: Secondary | ICD-10-CM

## 2022-11-18 DIAGNOSIS — L821 Other seborrheic keratosis: Secondary | ICD-10-CM

## 2022-11-18 DIAGNOSIS — D239 Other benign neoplasm of skin, unspecified: Secondary | ICD-10-CM

## 2022-11-18 DIAGNOSIS — D2262 Melanocytic nevi of left upper limb, including shoulder: Secondary | ICD-10-CM | POA: Diagnosis not present

## 2022-11-18 DIAGNOSIS — L814 Other melanin hyperpigmentation: Secondary | ICD-10-CM

## 2022-11-18 DIAGNOSIS — D2260 Melanocytic nevi of unspecified upper limb, including shoulder: Secondary | ICD-10-CM | POA: Diagnosis not present

## 2022-11-18 DIAGNOSIS — D492 Neoplasm of unspecified behavior of bone, soft tissue, and skin: Secondary | ICD-10-CM

## 2022-11-18 DIAGNOSIS — L578 Other skin changes due to chronic exposure to nonionizing radiation: Secondary | ICD-10-CM | POA: Diagnosis not present

## 2022-11-18 HISTORY — DX: Other benign neoplasm of skin, unspecified: D23.9

## 2022-11-18 NOTE — Progress Notes (Signed)
Follow-Up Visit   Subjective  Audrey Hayden is a 72 y.o. female who presents for the following: Annual Exam. 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 The patient presents for Total-Body Skin Exam (TBSE) for skin cancer screening and mole check.  The patient has spots, moles and lesions to be evaluated, some may be new or changing and the patient has concerns that these could be cancer.    The following portions of the chart were reviewed this encounter and updated as appropriate:       Review of Systems:  No other skin or systemic complaints except as noted in HPI or Assessment and Plan.  Objective  Well appearing patient in no apparent distress; mood and affect are within normal limits.  A full examination was performed including scalp, head, eyes, ears, nose, lips, neck, chest, axillae, abdomen, back, buttocks, bilateral upper extremities, bilateral lower extremities, hands, feet, fingers, toes, fingernails, and toenails. All findings within normal limits unless otherwise noted below.  left shoulder 4 x 3 mm speckled brown macule        bilateral lower legs Tan/brown macules/patches lower legs  left lower anterior thigh 8 mm medium light brown papule with emerging hair   lower sternum intermammary 4 mm speckled brown papule     Assessment & Plan  Neoplasm of skin left shoulder  Epidermal / dermal shaving  Lesion diameter (cm):  0.6 Informed consent: discussed and consent obtained   Timeout: patient name, date of birth, surgical site, and procedure verified   Anesthesia: the lesion was anesthetized in a standard fashion   Anesthetic:  1% lidocaine w/ epinephrine 1-100,000 local infiltration Instrument used: flexible razor blade   Hemostasis achieved with: aluminum chloride   Outcome: patient tolerated procedure well   Post-procedure details: wound care instructions given   Additional details:  Mupirocin and a bandage  applied  Specimen 1 - Surgical pathology Differential Diagnosis: R/O Dysplastic nevus   Check Margins: Yes  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  Benign. Observe.  Continue daily compression stockings    Congenital nevus left lower anterior thigh  Stable Benign-appearing.  Observation.  Call clinic for new or changing moles.  Recommend daily use of broad spectrum spf 30+ sunscreen to sun-exposed areas.    Nevus lower sternum intermammary  Vs SK Benign-appearing. Stable compared to previous visit. Observation.  Call clinic for new or changing moles.  Recommend daily use of broad spectrum spf 30+ sunscreen to sun-exposed areas.    skin cancer screening performed today.Marland Kitchen   Actinic Damage - chronic, secondary to cumulative UV radiation exposure/sun exposure over time - diffuse scaly erythematous macules with underlying dyspigmentation - Recommend daily broad spectrum sunscreen SPF 30+ to sun-exposed areas, reapply every 2 hours as needed.  - Recommend staying in the shade or wearing long sleeves, sun glasses (UVA+UVB protection) and wide brim hats (4-inch brim around the entire circumference of the hat). - Call for new or changing lesions.   Lentigines - Scattered tan macules - Due to sun exposure - Benign-appering, observe - Recommend daily broad spectrum sunscreen SPF 30+ to sun-exposed areas, reapply every 2 hours as needed. - Call for any changes. - face   Seborrheic Keratoses - Stuck-on, waxy, tan-brown papules and/or plaques  - Benign-appearing - Discussed benign etiology and prognosis. - Observe - Call for any changes - face, arms   Melanocytic Nevi - Tan-brown and/or pink-flesh-colored symmetric macules and papules - Benign appearing  on exam today - Observation - Call clinic for new or changing moles - Recommend daily use of broad spectrum spf 30+ sunscreen to sun-exposed areas.  - back   Varicose Veins/Spider Veins - Dilated  blue, purple or red veins at the lower extremities - Reassured - Smaller vessels can be treated by sclerotherapy (a procedure to inject a medicine into the veins to make them disappear) if desired, but the treatment is not covered by insurance. Larger vessels may be covered if symptomatic and we would refer to vascular surgeon if treatment desired.      History of melanoma right scapula   CLARK/ANATOMIC LEVEL: II, Breslows AT LEAST 0.3 MM, WLE 09/16/21   Clear. Observe for recurrence. Call clinic for new or changing lesions.  Recommend regular skin exams, daily broad-spectrum spf 30+ sunscreen use, and photoprotection.      Return in about 6 months (around 05/21/2023) for TBSE, hx of Melanoma.  I, Marye Round, CMA, am acting as scribe for Brendolyn Patty, MD .   Documentation: I have reviewed the above documentation for accuracy and completeness, and I agree with the above.  Brendolyn Patty MD

## 2022-11-18 NOTE — Patient Instructions (Addendum)
Wound Care Instructions  Cleanse wound gently with soap and water once a day then pat dry with clean gauze. Apply a thin coat of Petrolatum (petroleum jelly, "Vaseline") over the wound (unless you have an allergy to this). We recommend that you use a new, sterile tube of Vaseline. Do not pick or remove scabs. Do not remove the yellow or white "healing tissue" from the base of the wound.  Cover the wound with fresh, clean, nonstick gauze and secure with paper tape. You may use Band-Aids in place of gauze and tape if the wound is small enough, but would recommend trimming much of the tape off as there is often too much. Sometimes Band-Aids can irritate the skin.  You should call the office for your biopsy report after 1 week if you have not already been contacted.  If you experience any problems, such as abnormal amounts of bleeding, swelling, significant bruising, significant pain, or evidence of infection, please call the office immediately.  FOR ADULT SURGERY PATIENTS: If you need something for pain relief you may take 1 extra strength Tylenol (acetaminophen) AND 2 Ibuprofen (200mg each) together every 4 hours as needed for pain. (do not take these if you are allergic to them or if you have a reason you should not take them.) Typically, you may only need pain medication for 1 to 3 days.     Due to recent changes in healthcare laws, you may see results of your pathology and/or laboratory studies on MyChart before the doctors have had a chance to review them. We understand that in some cases there may be results that are confusing or concerning to you. Please understand that not all results are received at the same time and often the doctors may need to interpret multiple results in order to provide you with the best plan of care or course of treatment. Therefore, we ask that you please give us 2 business days to thoroughly review all your results before contacting the office for clarification. Should  we see a critical lab result, you will be contacted sooner.   If You Need Anything After Your Visit  If you have any questions or concerns for your doctor, please call our main line at [phone] and press option 4 to reach your doctor's medical assistant. If no one answers, please leave a voicemail as directed and we will return your call as soon as possible. Messages left after 4 pm will be answered the following business day.   You may also send us a message via MyChart. We typically respond to MyChart messages within 1-2 business days.  For prescription refills, please ask your pharmacy to contact our office. Our fax number is [phone].  If you have an urgent issue when the clinic is closed that cannot wait until the next business day, you can page your doctor at the number below.    Please note that while we do our best to be available for urgent issues outside of office hours, we are not available 24/7.   If you have an urgent issue and are unable to reach us, you may choose to seek medical care at your doctor's office, retail clinic, urgent care center, or emergency room.  If you have a medical emergency, please immediately call 911 or go to the emergency department.  Pager Numbers  - Dr. Kowalski: [phone]  - Dr. Moye: [phone]  - Dr. Stewart: [phone]  In the event of inclement weather, please call our main line at   [phone] for an update on the status of any delays or closures.  Dermatology Medication Tips: Please keep the boxes that topical medications come in in order to help keep track of the instructions about where and how to use these. Pharmacies typically print the medication instructions only on the boxes and not directly on the medication tubes.   If your medication is too expensive, please contact our office at [phone] option 4 or send us a message through MyChart.   We are unable to tell what your co-pay for medications will be in  advance as this is different depending on your insurance coverage. However, we may be able to find a substitute medication at lower cost or fill out paperwork to get insurance to cover a needed medication.   If a prior authorization is required to get your medication covered by your insurance company, please allow us 1-2 business days to complete this process.  Drug prices often vary depending on where the prescription is filled and some pharmacies may offer cheaper prices.  The website www.goodrx.com contains coupons for medications through different pharmacies. The prices here do not account for what the cost may be with help from insurance (it may be cheaper with your insurance), but the website can give you the price if you did not use any insurance.  - You can print the associated coupon and take it with your prescription to the pharmacy.  - You may also stop by our office during regular business hours and pick up a GoodRx coupon card.  - If you need your prescription sent electronically to a different pharmacy, notify our office through [facility] MyChart or by phone at [phone] option 4.     Si Usted Necesita Algo Despus de Su Visita  Tambin puede enviarnos un mensaje a travs de MyChart. Por lo general respondemos a los mensajes de MyChart en el transcurso de 1 a 2 das hbiles.  Para renovar recetas, por favor pida a su farmacia que se ponga en contacto con nuestra oficina. Nuestro nmero de fax es el [phone].  Si tiene un asunto urgente cuando la clnica est cerrada y que no puede esperar hasta el siguiente da hbil, puede llamar/localizar a su doctor(a) al nmero que aparece a continuacin.   Por favor, tenga en cuenta que aunque hacemos todo lo posible para estar disponibles para asuntos urgentes fuera del horario de oficina, no estamos disponibles las 24 horas del da, los 7 das de la semana.   Si tiene un problema urgente y no puede comunicarse con nosotros, puede  optar por buscar atencin mdica  en el consultorio de su doctor(a), en una clnica privada, en un centro de atencin urgente o en una sala de emergencias.  Si tiene una emergencia mdica, por favor llame inmediatamente al 911 o vaya a la sala de emergencias.  Nmeros de bper  - Dr. Kowalski: [phone]  - Dra. Moye: [phone]  - Dra. Stewart: [phone]  En caso de inclemencias del tiempo, por favor llame a nuestra lnea principal al [phone] para una actualizacin sobre el estado de cualquier retraso o cierre.  Consejos para la medicacin en dermatologa: Por favor, guarde las cajas en las que vienen los medicamentos de uso tpico para ayudarle a seguir las instrucciones sobre dnde y cmo usarlos. Las farmacias generalmente imprimen las instrucciones del medicamento slo en las cajas y no directamente en los tubos del medicamento.   Si su medicamento es muy caro, por favor, pngase en contacto con   nuestra oficina llamando al [phone] y presione la opcin 4 o envenos un mensaje a travs de MyChart.   No podemos decirle cul ser su copago por los medicamentos por adelantado ya que esto es diferente dependiendo de la cobertura de su seguro. Sin embargo, es posible que podamos encontrar un medicamento sustituto a menor costo o llenar un formulario para que el seguro cubra el medicamento que se considera necesario.   Si se requiere una autorizacin previa para que su compaa de seguros cubra su medicamento, por favor permtanos de 1 a 2 das hbiles para completar este proceso.  Los precios de los medicamentos varan con frecuencia dependiendo del lugar de dnde se surte la receta y alguna farmacias pueden ofrecer precios ms baratos.  El sitio web www.goodrx.com tiene cupones para medicamentos de diferentes farmacias. Los precios aqu no tienen en cuenta lo que podra costar con la ayuda del seguro (puede ser ms barato con su seguro), pero el sitio web puede darle el  precio si no utiliz ningn seguro.  - Puede imprimir el cupn correspondiente y llevarlo con su receta a la farmacia.  - Tambin puede pasar por nuestra oficina durante el horario de atencin regular y recoger una tarjeta de cupones de GoodRx.  - Si necesita que su receta se enve electrnicamente a una farmacia diferente, informe a nuestra oficina a travs de MyChart de [place] o por telfono llamando al [phone] y presione la opcin 4.  

## 2022-11-24 ENCOUNTER — Telehealth: Payer: Self-pay

## 2022-11-24 NOTE — Telephone Encounter (Signed)
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

## 2022-11-24 NOTE — Telephone Encounter (Signed)
Advised patient biopsy of the left shoulder was moderate dysplastic nevus. No further treatment needed. Will recheck on follow-up.

## 2022-12-29 IMAGING — MG MM DIGITAL SCREENING BILAT W/ TOMO AND CAD
6 of 10 series · 6 of 30 positions shown · non-contrast
Comparison: Previous exam(s).

CLINICAL DATA: Screening.

EXAM:
DIGITAL SCREENING BILATERAL MAMMOGRAM WITH TOMOSYNTHESIS AND CAD
TECHNIQUE: Bilateral screening digital craniocaudal and mediolateral oblique
mammograms were obtained. Bilateral screening digital breast
tomosynthesis was performed. The images were evaluated with
computer-aided detection.

[L MLO synth-2D]
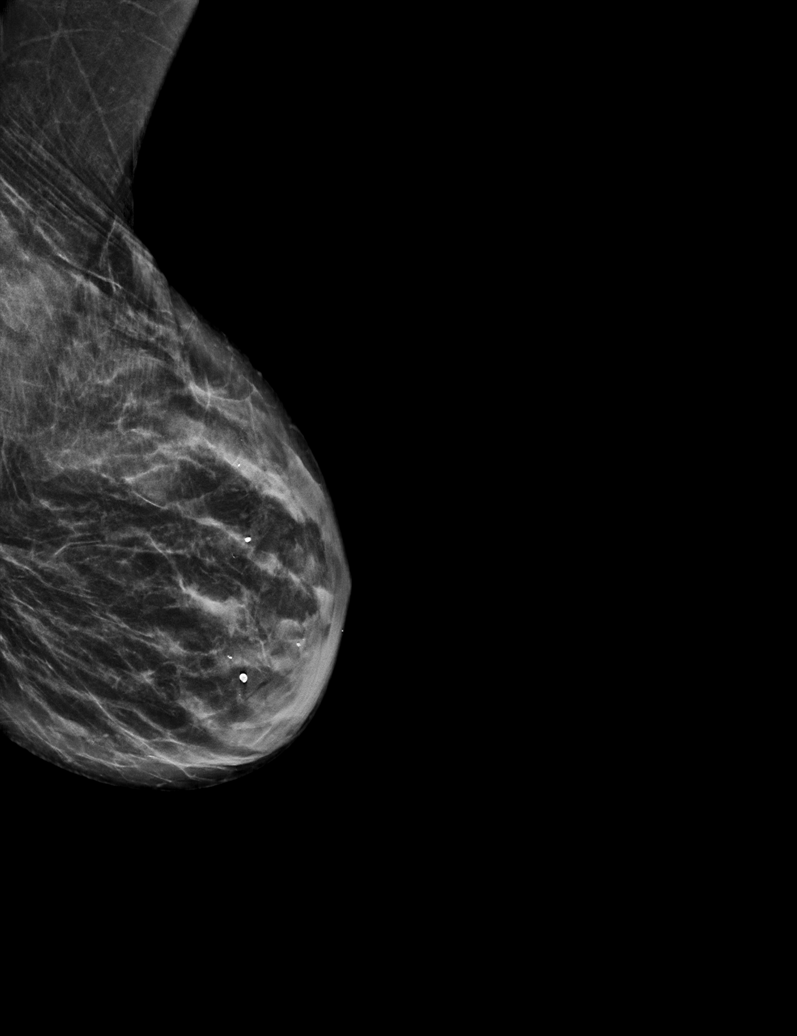

[L XCCL synth-2D]
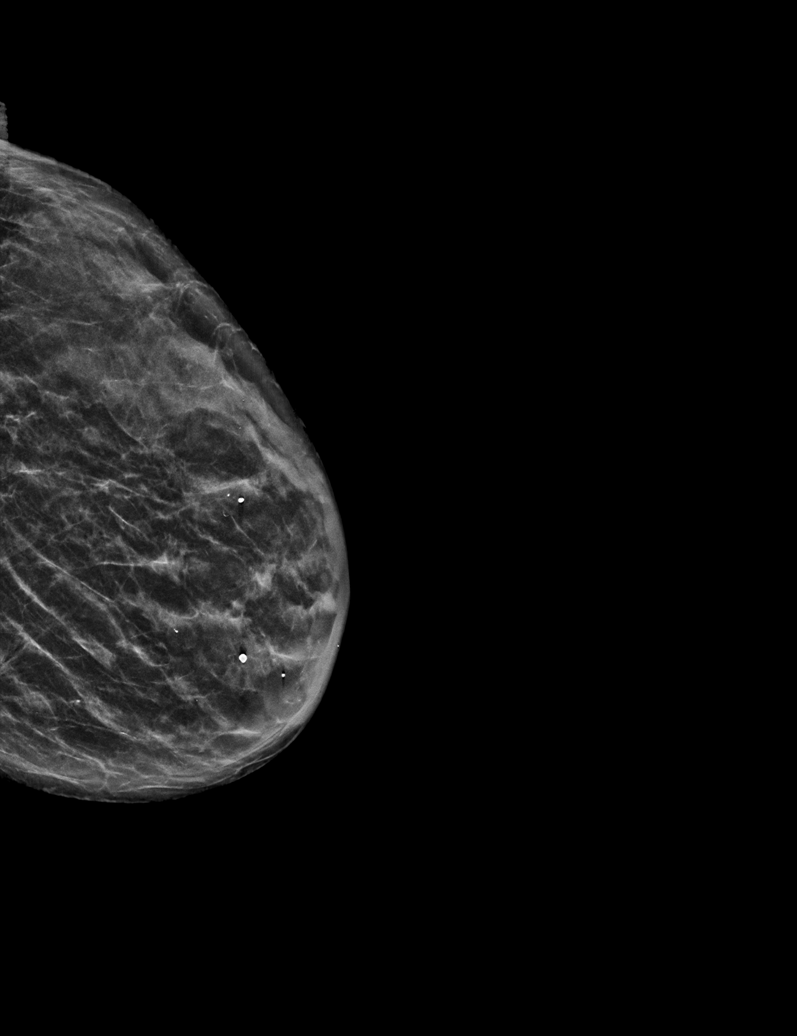

[R MLO synth-2D]
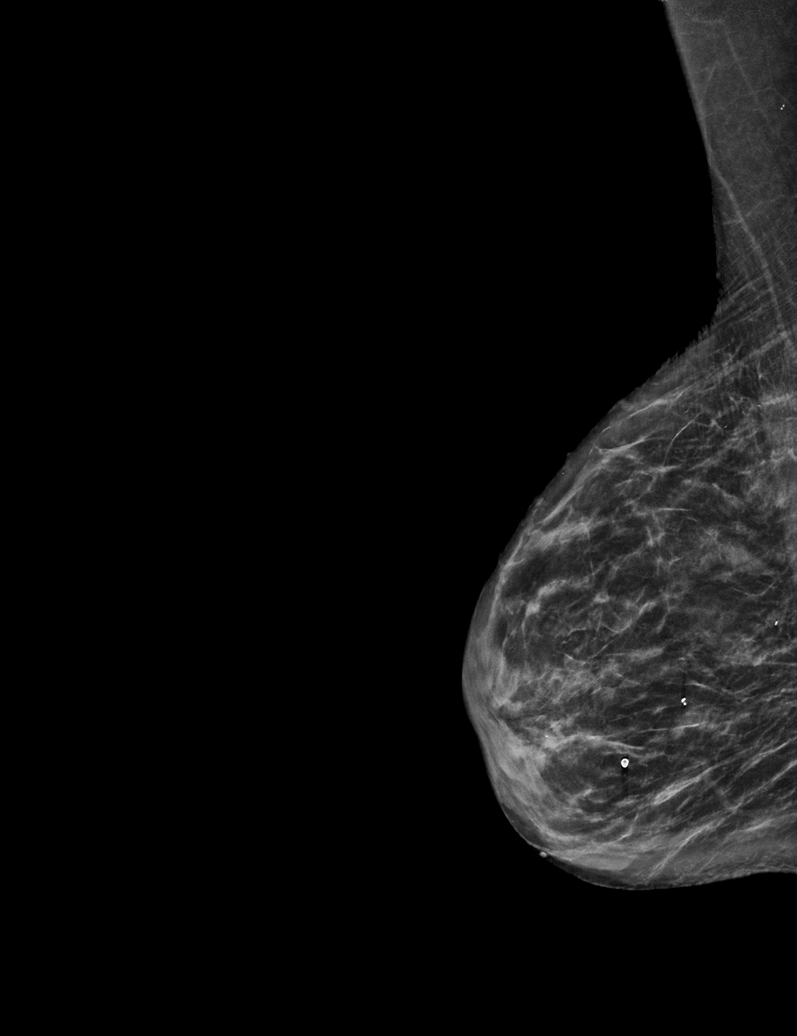

[L CC synth-2D]
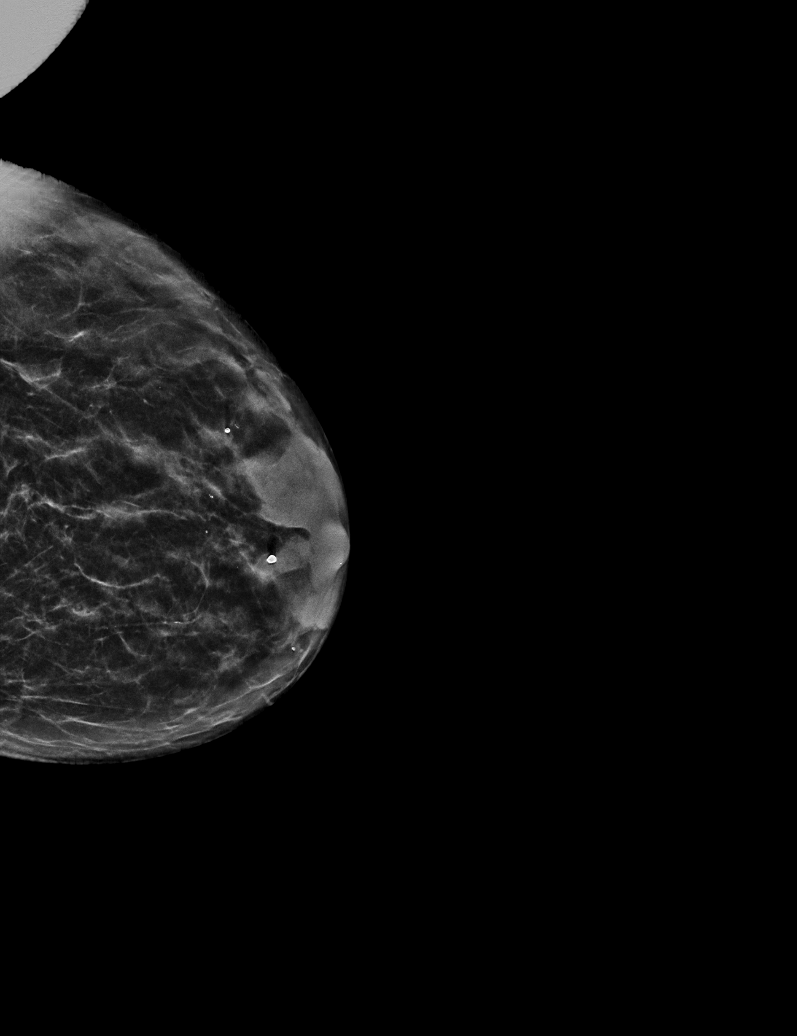

[R CC synth-2D]
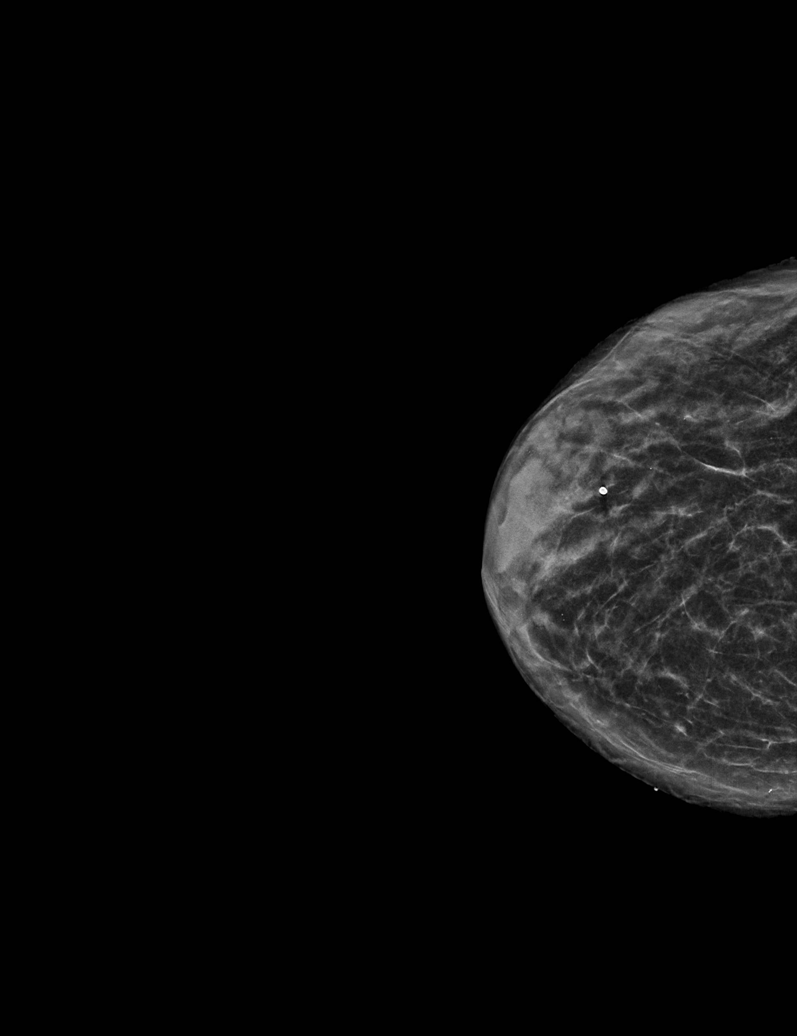

[L CC tomo · tomo slice 25/48.0]
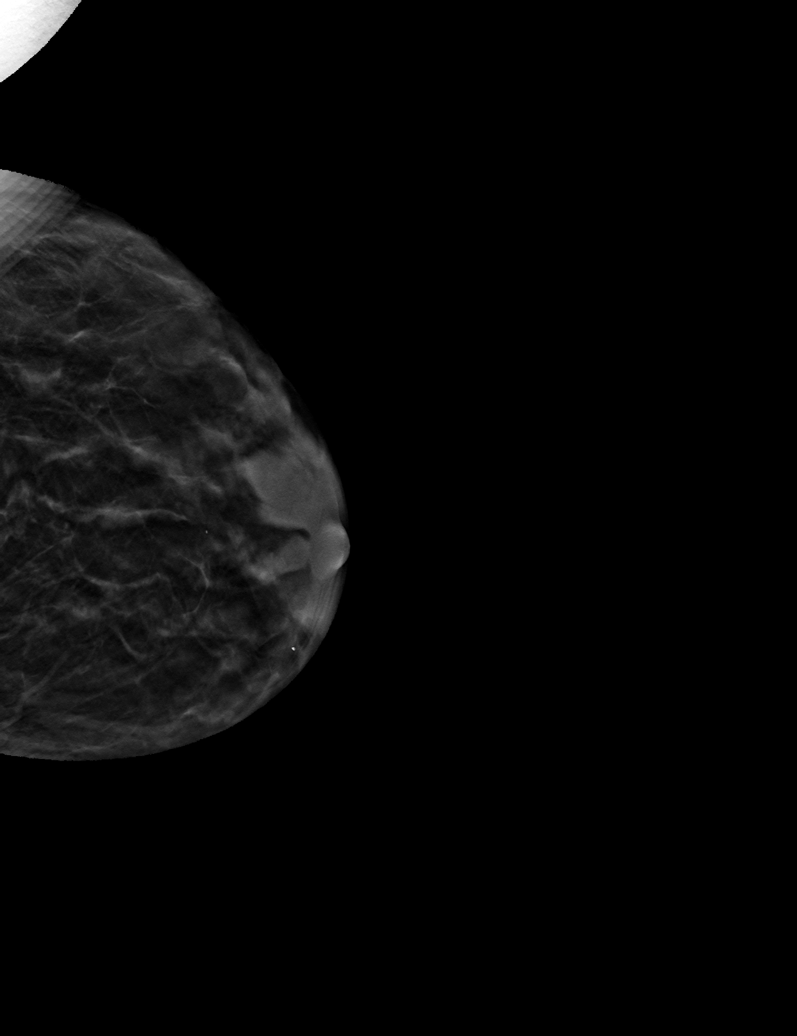

[6 of 30 positions shown; findings below may reference images not displayed]

ACR Breast Density Category c: The breast tissue is heterogeneously
dense, which may obscure small masses.
FINDINGS: There are no findings suspicious for malignancy.
IMPRESSION: No mammographic evidence of malignancy. A result letter of this
screening mammogram will be mailed directly to the patient.

RECOMMENDATION:
Screening mammogram in one year. (Code:[id number])

BI-RADS CATEGORY  1: Negative.

## 2023-01-22 ENCOUNTER — Other Ambulatory Visit: Payer: Self-pay | Admitting: Internal Medicine

## 2023-01-22 DIAGNOSIS — Z1231 Encounter for screening mammogram for malignant neoplasm of breast: Secondary | ICD-10-CM

## 2023-02-11 ENCOUNTER — Ambulatory Visit
Admission: RE | Admit: 2023-02-11 | Discharge: 2023-02-11 | Disposition: A | Discharge status: Home / Self Care | Payer: Medicare Other | Source: Ambulatory Visit | Attending: Internal Medicine | Admitting: Internal Medicine

## 2023-02-11 DIAGNOSIS — Z1231 Encounter for screening mammogram for malignant neoplasm of breast: Secondary | ICD-10-CM | POA: Diagnosis not present

## 2023-02-16 ENCOUNTER — Encounter: Payer: Self-pay | Admitting: Dermatology

## 2023-02-16 ENCOUNTER — Ambulatory Visit (INDEPENDENT_AMBULATORY_CARE_PROVIDER_SITE_OTHER): Payer: Medicare Other | Admitting: Dermatology

## 2023-02-16 VITALS — BP 144/81 | HR 72

## 2023-02-16 DIAGNOSIS — R21 Rash and other nonspecific skin eruption: Secondary | ICD-10-CM | POA: Diagnosis not present

## 2023-02-16 NOTE — Progress Notes (Signed)
   Follow Up Visit   Subjective  Audrey Hayden is a 72 y.o. female who presents for the following:Patient here concerning a itchy rash that started a week ago at legs extending to top of feet. Patient reports she has many allergies and sensitivities. Has sensitivity to gluten and did eat some breaded chicken nuggets at a party the day before rash. Has been using over the counter Vaseline intensive care lotion and reports rash has improved.   The following portions of the chart were reviewed this encounter and updated as appropriate: medications, allergies, medical history  Review of Systems:  No other skin or systemic complaints except as noted in HPI or Assessment and Plan.  Objective  Well appearing patient in no apparent distress; mood and affect are within normal limits.  A focused examination was performed of the following areas: Bilateral legs and feet  Relevant exam findings are noted in the Assessment and Plan.    Assessment & Plan     RASH- resolving Hx of itchy rash a week ago at legs extending to feet Patient has gluten sensitivity reports ate something with gluten the night before  Possible relation to gluten ingestion due to timing of rash and history of gluten sensitivity    Exam: faint dusky eyrthema at popliteal fossa light pink patches on dorsal feet   Treatment Plan: Recommend OTC Gold Bond Rapid Relief Anti-Itch cream (pramoxine + menthol), CeraVe Anti-itch cream or lotion (pramoxine), Sarna lotion (Original- menthol + camphor or Sensitive- pramoxine) or Eucerin 12 hour Itch Relief lotion (menthol) up to 3 times per day to areas on body that are itchy.  Gentle Skin Care Guide included in hand out    Return for keep follow up as scheduled .  I, Asher Muir, CMA, am acting as scribe for Willeen Niece, MD.   Documentation: I have reviewed the above documentation for accuracy and completeness, and I agree with the above.  Willeen Niece, MD

## 2023-02-16 NOTE — Patient Instructions (Addendum)
Recommend OTC Gold Bond Rapid Relief Anti-Itch cream (pramoxine + menthol), CeraVe Anti-itch cream or lotion (pramoxine), Sarna lotion (Original- menthol + camphor or Sensitive- pramoxine) or Eucerin 12 hour Itch Relief lotion (menthol) up to 3 times per day to areas on body that are itchy.    Gentle Skin Care Guide  1. Bathe no more than once a day.  2. Avoid bathing in hot water  3. Use a mild soap like Dove, Vanicream, Cetaphil, CeraVe. Can use Lever 2000 or Cetaphil antibacterial soap  4. Use soap only where you need it. On most days, use it under your arms, between your legs, and on your feet. Let the water rinse other areas unless visibly dirty.  5. When you get out of the bath/shower, use a towel to gently blot your skin dry, don't rub it.  6. While your skin is still a little damp, apply a moisturizing cream such as Vanicream, CeraVe, Cetaphil, Eucerin, Sarna lotion or plain Vaseline Jelly. For hands apply Neutrogena Norwegian Hand Cream or Excipial Hand Cream.  7. Reapply moisturizer any time you start to itch or feel dry.  8. Sometimes using free and clear laundry detergents can be helpful. Fabric softener sheets should be avoided. Downy Free & Gentle liquid, or any liquid fabric softener that is free of dyes and perfumes, it acceptable to use  9. If your doctor has given you prescription creams you may apply moisturizers over them     Due to recent changes in healthcare laws, you may see results of your pathology and/or laboratory studies on MyChart before the doctors have had a chance to review them. We understand that in some cases there may be results that are confusing or concerning to you. Please understand that not all results are received at the same time and often the doctors may need to interpret multiple results in order to provide you with the best plan of care or course of treatment. Therefore, we ask that you please give us 2 business days to thoroughly review all  your results before contacting the office for clarification. Should we see a critical lab result, you will be contacted sooner.   If You Need Anything After Your Visit  If you have any questions or concerns for your doctor, please call our main line at [phone] and press option 4 to reach your doctor's medical assistant. If no one answers, please leave a voicemail as directed and we will return your call as soon as possible. Messages left after 4 pm will be answered the following business day.   You may also send us a message via MyChart. We typically respond to MyChart messages within 1-2 business days.  For prescription refills, please ask your pharmacy to contact our office. Our fax number is [phone].  If you have an urgent issue when the clinic is closed that cannot wait until the next business day, you can page your doctor at the number below.    Please note that while we do our best to be available for urgent issues outside of office hours, we are not available 24/7.   If you have an urgent issue and are unable to reach us, you may choose to seek medical care at your doctor's office, retail clinic, urgent care center, or emergency room.  If you have a medical emergency, please immediately call 911 or go to the emergency department.  Pager Numbers  - Dr. Kowalski: [phone]  - Dr. Moye: [phone]  - Dr. Stewart:   [phone]  In the event of inclement weather, please call our main line at [phone] for an update on the status of any delays or closures.  Dermatology Medication Tips: Please keep the boxes that topical medications come in in order to help keep track of the instructions about where and how to use these. Pharmacies typically print the medication instructions only on the boxes and not directly on the medication tubes.   If your medication is too expensive, please contact our office at [phone] option 4 or send us a message through MyChart.   We  are unable to tell what your co-pay for medications will be in advance as this is different depending on your insurance coverage. However, we may be able to find a substitute medication at lower cost or fill out paperwork to get insurance to cover a needed medication.   If a prior authorization is required to get your medication covered by your insurance company, please allow us 1-2 business days to complete this process.  Drug prices often vary depending on where the prescription is filled and some pharmacies may offer cheaper prices.  The website www.goodrx.com contains coupons for medications through different pharmacies. The prices here do not account for what the cost may be with help from insurance (it may be cheaper with your insurance), but the website can give you the price if you did not use any insurance.  - You can print the associated coupon and take it with your prescription to the pharmacy.  - You may also stop by our office during regular business hours and pick up a GoodRx coupon card.  - If you need your prescription sent electronically to a different pharmacy, notify our office through [facility] MyChart or by phone at [phone] option 4.     Si Usted Necesita Algo Despus de Su Visita  Tambin puede enviarnos un mensaje a travs de MyChart. Por lo general respondemos a los mensajes de MyChart en el transcurso de 1 a 2 das hbiles.  Para renovar recetas, por favor pida a su farmacia que se ponga en contacto con nuestra oficina. Nuestro nmero de fax es el [phone].  Si tiene un asunto urgente cuando la clnica est cerrada y que no puede esperar hasta el siguiente da hbil, puede llamar/localizar a su doctor(a) al nmero que aparece a continuacin.   Por favor, tenga en cuenta que aunque hacemos todo lo posible para estar disponibles para asuntos urgentes fuera del horario de oficina, no estamos disponibles las 24 horas del da, los 7 das de la semana.   Si tiene  un problema urgente y no puede comunicarse con nosotros, puede optar por buscar atencin mdica  en el consultorio de su doctor(a), en una clnica privada, en un centro de atencin urgente o en una sala de emergencias.  Si tiene una emergencia mdica, por favor llame inmediatamente al 911 o vaya a la sala de emergencias.  Nmeros de bper  - Dr. Kowalski: [phone]  - Dra. Moye: [phone]  - Dra. Stewart: [phone]  En caso de inclemencias del tiempo, por favor llame a nuestra lnea principal al [phone] para una actualizacin sobre el estado de cualquier retraso o cierre.  Consejos para la medicacin en dermatologa: Por favor, guarde las cajas en las que vienen los medicamentos de uso tpico para ayudarle a seguir las instrucciones sobre dnde y cmo usarlos. Las farmacias generalmente imprimen las instrucciones del medicamento slo en las cajas y no directamente en los tubos del medicamento.     Si su medicamento es muy caro, por favor, pngase en contacto con nuestra oficina llamando al [phone] y presione la opcin 4 o envenos un mensaje a travs de MyChart.   No podemos decirle cul ser su copago por los medicamentos por adelantado ya que esto es diferente dependiendo de la cobertura de su seguro. Sin embargo, es posible que podamos encontrar un medicamento sustituto a menor costo o llenar un formulario para que el seguro cubra el medicamento que se considera necesario.   Si se requiere una autorizacin previa para que su compaa de seguros cubra su medicamento, por favor permtanos de 1 a 2 das hbiles para completar este proceso.  Los precios de los medicamentos varan con frecuencia dependiendo del lugar de dnde se surte la receta y alguna farmacias pueden ofrecer precios ms baratos.  El sitio web www.goodrx.com tiene cupones para medicamentos de diferentes farmacias. Los precios aqu no tienen en cuenta lo que podra costar con la ayuda del seguro (puede ser  ms barato con su seguro), pero el sitio web puede darle el precio si no utiliz ningn seguro.  - Puede imprimir el cupn correspondiente y llevarlo con su receta a la farmacia.  - Tambin puede pasar por nuestra oficina durante el horario de atencin regular y recoger una tarjeta de cupones de GoodRx.  - Si necesita que su receta se enve electrnicamente a una farmacia diferente, informe a nuestra oficina a travs de MyChart de [place] [place] o por telfono llamando al [phone] y presione la opcin 4.  

## 2023-02-26 ENCOUNTER — Encounter: Payer: Self-pay | Admitting: Internal Medicine

## 2023-02-26 ENCOUNTER — Ambulatory Visit (INDEPENDENT_AMBULATORY_CARE_PROVIDER_SITE_OTHER): Payer: Medicare Other | Admitting: Internal Medicine

## 2023-02-26 VITALS — BP 122/72 | HR 76 | Temp 98.3°F | Ht 68.0 in | Wt 129.2 lb

## 2023-02-26 DIAGNOSIS — J329 Chronic sinusitis, unspecified: Secondary | ICD-10-CM

## 2023-02-26 DIAGNOSIS — B9689 Other specified bacterial agents as the cause of diseases classified elsewhere: Secondary | ICD-10-CM

## 2023-02-26 MED ORDER — AZITHROMYCIN 250 MG PO TABS: ORAL_TABLET | ORAL | 0 refills | Status: AC

## 2023-02-26 NOTE — Progress Notes (Signed)
Date:  02/26/2023   Name:  Audrey Hayden   DOB:  Sep 11, 1950   MRN:  [id number]   Chief Complaint: Sinusitis  Sinusitis This is a new problem. The current episode started in the past 7 days. The problem has been waxing and waning since onset. There has been no fever. Associated symptoms include congestion, coughing, sinus pressure and a sore throat. Pertinent negatives include no headaches.  Covid test at home yesterday was negative.  Lab Results  Component Value Date   NA 137 03/27/2021   K 3.9 03/27/2021   CO2 28 03/27/2021   GLUCOSE 94 03/27/2021   BUN 16 03/27/2021   CREATININE 0.76 03/27/2021   CALCIUM 9.4 03/27/2021   GFRNONAA >60 03/27/2021   Lab Results  Component Value Date   CHOL 189 03/20/2021   TRIG 60 03/20/2021   Lab Results  Component Value Date   TSH 2.130 11/19/2017   Lab Results  Component Value Date   HGBA1C 5.6 08/21/2021   Lab Results  Component Value Date   WBC 6.6 03/27/2021   HGB 13.8 03/27/2021   HCT 41.6 03/27/2021   MCV 92.4 03/27/2021   PLT 187 03/27/2021   Lab Results  Component Value Date   ALT 37 03/26/2021   AST 34 03/26/2021   ALKPHOS 85 03/26/2021   BILITOT 1.4 (H) 03/26/2021   Lab Results  Component Value Date   VD25OH 127.0 (H) 08/21/2021     Review of Systems  Constitutional:  Negative for fatigue and fever.  HENT:  Positive for congestion, sinus pressure and sore throat.   Respiratory:  Positive for cough and wheezing (occasionally after a coughing spell). Negative for chest tightness.   Cardiovascular:  Negative for chest pain and palpitations.  Neurological:  Negative for dizziness, light-headedness and headaches.  Psychiatric/Behavioral:  Positive for sleep disturbance (due to cough).     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    Clarithromycin Other (See Comments)    Causes her to faint.   Codeine Other (See Comments)    Causes her GI upset.   Hydromorphone Hcl Other (See Comments)    Unsure of reaction   Hydromorphone Hcl Other (See Comments)   Ibuprofen Other (See Comments)   Meperidine Other (See Comments)    Unsure of reaction   Montelukast Other (See Comments)   Moxifloxacin Other (See Comments)    Dizziness    Nsaids     Other reaction(s): Unknown   Omeprazole Other (See Comments)  Ears ringing with more than 20mg /day   Other Other (See Comments)    All profins cause her to faint. All the mycins cause her to faint.   Prednisone Other (See Comments)    Bright red face and ankle swelling: possible reaction to the medicine.   Pseudoephedrine Other (See Comments)   Pseudoephedrine Hcl     Other reaction(s): Syncope   Ranitidine     Other reaction(s): Other (See Comments) SE noted with high dosage Dk urine and stools, HA   Salicylates Hives   Aspirin Hives and Rash   Dexilant [Dexlansoprazole] Rash    Peeling skin   Doxycycline Other (See Comments)    Headache. "Pressure on the brain''   Montelukast Sodium Palpitations and Other (See Comments)    Past  Surgical History:  Procedure Laterality Date   BREAST BIOPSY Left    neg   COLONOSCOPY  2004   COLONOSCOPY WITH PROPOFOL N/A 06/19/2021   Procedure: COLONOSCOPY WITH PROPOFOL;  Surgeon: Pasty Spillers, MD;  Location: [facility] ENDOSCOPY;  Service: Endoscopy;  Laterality: N/A;   ESOPHAGOGASTRODUODENOSCOPY (EGD) WITH PROPOFOL N/A 12/05/2020   Procedure: ESOPHAGOGASTRODUODENOSCOPY (EGD) WITH PROPOFOL;  Surgeon: Pasty Spillers, MD;  Location: [facility] ENDOSCOPY;  Service: Endoscopy;  Laterality: N/A;   TONSILLECTOMY     UPPER GI ENDOSCOPY  12/2018    Social History   Tobacco Use   Smoking status: Former    Packs/day: 1.50    Years: 5.00    Additional pack years: 0.00    Total pack years: 7.50    Types: Cigarettes    Start date: 56    Quit date: 04/02/1975    Years since quitting: 47.9   Smokeless tobacco: Never   Tobacco comments:    smoking cessation materials not required  Vaping Use   Vaping Use: Never used  Substance Use Topics   Alcohol use: No    Alcohol/week: 0.0 standard drinks of alcohol   Drug use: No     Medication list has been reviewed and updated.  Current Meds  Medication Sig   azithromycin (ZITHROMAX Z-PAK) 250 MG tablet UAD   Boswellia-Glucosamine-Vit D (OSTEO BI-FLEX ONE PER DAY PO) Take by mouth.   cetirizine (ZYRTEC) 10 MG tablet Take 10 mg by mouth daily.   Homeopathic Products (LIVER SUPPORT SL) Place under the tongue.   LYSINE PO Take by mouth.   Melatonin ([employer] MELATONIN GUMMIES) 1 MG CHEW Chew by mouth.   Multiple Vitamins-Minerals (ALIVE WOMENS 50+ PO)    Pancreatin 500 MG CAPS Take by mouth. Digestive enzymes   Probiotic Product (PROBIOTIC BLEND PO) in the morning, at noon, and at bedtime.   Quercetin 250 MG TABS Take 500 mg by mouth at bedtime.   [DISCONTINUED] methocarbamol (ROBAXIN) 500 MG tablet Take 1 tablet (500 mg total) by mouth every 8 (eight) hours as needed for muscle spasms.       02/26/2023   10:03 AM 07/11/2022   10:41 AM  03/10/2022    1:34 PM 09/06/2021   11:08 AM  GAD 7 : Generalized Anxiety Score  Nervous, Anxious, on Edge 0 0 0 0  Control/stop worrying  0 0 0  Worry too much - different things 0 0 0 0  Trouble relaxing 1 0 0 1  Restless 0 0 0 0  Easily annoyed or irritable 0 0 0 0  Afraid - awful might happen 0 0 0 0  Total GAD 7 Score  0 0 1  Anxiety Difficulty Not  difficult at all Not difficult at all Not difficult at all        02/26/2023   10:03 AM 07/16/2022    2:02 PM 07/11/2022   10:41 AM  Depression screen PHQ 2/9  Decreased Interest 0 0 0  Down, Depressed, Hopeless 0 0 0  PHQ - 2 Score 0 0 0  Altered sleeping 1 1 0  Tired, decreased energy 0 0 0  Change in appetite 0 0 0  Feeling bad or failure about yourself  0 0 0  Trouble concentrating 0 0 0  Moving slowly or fidgety/restless 0 0 0  Suicidal thoughts 0 0 0  PHQ-9 Score 1 1 0  Difficult doing work/chores Not difficult at all Not difficult at all Not difficult at all    BP Readings from Last 3 Encounters:  02/26/23 122/72  02/16/23 (!) 144/81  11/18/22 98/69    Physical Exam Constitutional:      Appearance: She is well-developed.  HENT:     Right Ear: Ear canal and external ear normal. Tympanic membrane is not erythematous or retracted.     Left Ear: Ear canal and external ear normal. Tympanic membrane is not erythematous or retracted.     Nose:     Right Sinus: Maxillary sinus tenderness and frontal sinus tenderness present.     Left Sinus: Maxillary sinus tenderness and frontal sinus tenderness present.     Mouth/Throat:     Mouth: Mucous membranes are moist. No oral lesions.     Pharynx: Uvula midline. Posterior oropharyngeal erythema present. No oropharyngeal exudate.  Cardiovascular:     Rate and Rhythm: Normal rate and regular rhythm.     Heart sounds: Normal heart sounds.  Pulmonary:     Breath sounds: Normal breath sounds. No wheezing, rhonchi or rales.  Lymphadenopathy:     Cervical: No cervical adenopathy.   Neurological:     Mental Status: She is alert and oriented to person, place, and time.     Wt Readings from Last 3 Encounters:  02/26/23 129 lb 3.2 oz (58.6 kg)  07/16/22 126 lb (57.2 kg)  07/11/22 126 lb (57.2 kg)    BP 122/72   Pulse 76   Temp 98.3 F (36.8 C) (Oral)   Ht 5\' 8"  (1.727 m)   Wt 129 lb 3.2 oz (58.6 kg)   SpO2 98%   BMI 19.64 kg/m   Assessment and Plan:  Problem List Items Addressed This Visit   None Visit Diagnoses     Bacterial sinusitis    -  Primary   continue fluids Flonase spray for nasal congestion Delsym for cough at night if needed   Relevant Medications   azithromycin (ZITHROMAX Z-PAK) 250 MG tablet       No follow-ups on file.   Partially dictated using Dragon software, any errors are not intentional.  Reubin Milan, MD [facility] [place], [place]

## 2023-02-26 NOTE — Patient Instructions (Signed)
Delsym cough suppressant over the counter.

## 2023-03-19 ENCOUNTER — Encounter: Payer: Self-pay | Admitting: Physician Assistant

## 2023-03-19 ENCOUNTER — Ambulatory Visit: Payer: Self-pay

## 2023-03-19 ENCOUNTER — Ambulatory Visit (INDEPENDENT_AMBULATORY_CARE_PROVIDER_SITE_OTHER): Payer: Medicare Other | Admitting: Physician Assistant

## 2023-03-19 VITALS — BP 100/64 | HR 78 | Temp 97.4°F | Ht 68.0 in | Wt 128.0 lb

## 2023-03-19 DIAGNOSIS — K760 Fatty (change of) liver, not elsewhere classified: Secondary | ICD-10-CM

## 2023-03-19 DIAGNOSIS — R195 Other fecal abnormalities: Secondary | ICD-10-CM | POA: Diagnosis not present

## 2023-03-19 DIAGNOSIS — R140 Abdominal distension (gaseous): Secondary | ICD-10-CM

## 2023-03-19 DIAGNOSIS — K449 Diaphragmatic hernia without obstruction or gangrene: Secondary | ICD-10-CM | POA: Diagnosis not present

## 2023-03-19 NOTE — Telephone Encounter (Signed)
Reason for Disposition . [1] MODERATE-SEVERE SWELLING of abdomen (e.g., looks very distended or swollen) AND [2] NEW-onset or much worse  Answer Assessment - Initial Assessment Questions 1. SYMPTOM: "What's the main symptom you're concerned about?" (e.g., abdomen bloating, swelling)     Bloating after eating 2. ONSET: "When did live, bloating  start?"     2 years - gotten  3. SEVERITY: "How bad is the bloating or swelling?"    - BLOATING: Feels gassy or bloated. No visible swelling.     - MILD SWELLING: Feels gassy or bloated. Abdomen looks mildly distended or swollen.    - MODERATE - SEVERE SWELLING: Abdomen looks very distended or swollen.      moderate 4. ABDOMEN PAIN:  "Is there any abdomen pain?" If Yes, ask: "How bad is the pain?"  (e.g., Scale 1-10; mild, moderate, or severe)   - NONE (0): No pain.   - MILD (1-3): Doesn't interfere with normal activities, abdomen soft and not tender to touch.    - MODERATE (4-7): Interferes with normal activities or awakens from sleep, abdomen tender to touch.    - SEVERE (8-10): Excruciating pain, doubled over, unable to do any normal activities.       discomfort 5. RELIEVING AND AGGRAVATING FACTORS: "What makes it better or worse?" (e.g., certain foods, lactose, medicines)     No difference 6. GI HISTORY: "Do you have any history of stomach or intestine problems?" (e.g., bowel obstruction, cancer, irritable bowel)      Yes - fatty liver 7. CAUSE: "What do you think is causing the bloating?"      Unsure 8. OTHER SYMPTOMS: "Do you have any other symptoms?" (e.g., belching, blood in stool, breathing difficulty, constipation, diarrhea, fever, passing gas, vomiting, weight loss, white of eyes have turned yellow)     Stools are yellow and mostly formed  Protocols used: Abdomen Bloating and Swelling-A-AH

## 2023-03-19 NOTE — Progress Notes (Signed)
Date:  03/19/2023   Name:  Audrey Hayden   DOB:  30-Aug-1951   MRN:  [id number]   Chief Complaint: Abdominal Swelling (X 2 weeks,Does not hurt, feels tight, getting bigger, goes down a little but after she eats gets bigger)  HPI Audrey Hayden is a pleasant 72 year old female with a history of many abdominal complaints new to me today, typically sees my colleague Dr. Bari Edward, MD, here to discuss acute on chronic abdominal distention/bloating.  She says this problem started about 2 years ago, around the time she was diagnosed with fatty liver disease, and the bloating was initially painful but she started taking a liver supplement from [employer] and currently states the bloating is painless.  Seems to be mainly postprandial.  She has regular BM, although she has noticed a stool color change over the last 2 weeks and they are now yellow and loose.  Chart review shows last hepatic function panel 2 years ago with slightly elevated total and indirect bilirubin.  Around the same time she had CT abdomen and pelvis with contrast as well as RUQ ultrasound; this revealed increased echogenicity of the liver, trace ascites, and periportal edema.  FibroSure panel run by GI provider at the time Dr. Maximino Hayden was fairly reassuring with no evidence of fibrosis/steatosis.  Colonoscopy October 2022 was unremarkable.  Have an interesting EGD April 2022 with finding of moderate Schatzki ring, mild hiatal hernia, various polyps and areas of erythema - biopsies were negative for malignancy, dysplasia, H. pylori.  Some concern for IBS vs SIBO on chart review  She requests a referral to GI Dr. Bing Hayden  Medication list has been reviewed and updated.  Current Meds  Medication Sig   cetirizine (ZYRTEC) 10 MG tablet Take 10 mg by mouth daily.   Homeopathic Products (LIVER SUPPORT SL) Place under the tongue.   LYSINE PO Take by mouth.   Multiple Vitamins-Minerals (ALIVE WOMENS 50+ PO)    Pancreatin  500 MG CAPS Take by mouth. Digestive enzymes   Probiotic Product (PROBIOTIC BLEND PO) in the morning, at noon, and at bedtime.   Quercetin 250 MG TABS Take 500 mg by mouth at bedtime.   [DISCONTINUED] Boswellia-Glucosamine-Vit D (OSTEO BI-FLEX ONE PER DAY PO) Take by mouth.   [DISCONTINUED] Melatonin ([employer] MELATONIN GUMMIES) 1 MG CHEW Chew by mouth.     Review of Systems  Constitutional:  Negative for fatigue and fever.  Respiratory:  Negative for chest tightness and shortness of breath.   Cardiovascular:  Negative for chest pain and palpitations.  Gastrointestinal:  Positive for abdominal distention. Negative for abdominal pain, blood in stool, constipation, diarrhea, nausea, rectal pain and vomiting.    Patient Active Problem List   Diagnosis Date Noted   Loose stools    Loss of weight    Thyroid nodule 05/03/2021   Allergic rhinitis 04/11/2021   Adverse reaction to food 04/11/2021   Esophageal dysphagia    Schatzki's ring    Gastric erythema    Asymptomatic spider veins of both lower extremities 02/25/2019   Chronic left-sided thoracic back pain 12/09/2017   Anxiety 12/09/2017   Environmental and seasonal allergies 06/19/2015   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/19/2015   Osteoporosis 06/19/2015   Abnormal Pap smear of cervix 06/19/2015   Elevated HDL 06/19/2015   History of gastritis 11/08/2014   Cough, persistent 08/14/2014    Allergies  Allergen Reactions   Egg-Derived Products Itching and Rash   Amoxicillin Other (See  Comments)    Tingling around the mouth   Azelastine Hcl Other (See Comments)   Cefdinir     Other reaction(s): Other (See Comments) Bright red face and ankle swelling: possible reaction to the medicine.    Clarithromycin Other (See Comments)    Causes her to faint.   Codeine Other (See Comments)    Causes her GI upset.   Hydromorphone Hcl Other (See Comments)    Unsure of reaction   Hydromorphone Hcl Other (See Comments)   Ibuprofen  Other (See Comments)   Meperidine Other (See Comments)    Unsure of reaction   Montelukast Other (See Comments)   Moxifloxacin Other (See Comments)    Dizziness    Nsaids     Other reaction(s): Unknown   Omeprazole Other (See Comments)    Ears ringing with more than 20mg /day   Other Other (See Comments)    All profins cause her to faint. All the mycins cause her to faint.   Prednisone Other (See Comments)    Bright red face and ankle swelling: possible reaction to the medicine.   Pseudoephedrine Other (See Comments)   Pseudoephedrine Hcl     Other reaction(s): Syncope   Ranitidine     Other reaction(s): Other (See Comments) SE noted with high dosage Dk urine and stools, HA   Salicylates Hives   Aspirin Hives and Rash   Dexilant [Dexlansoprazole] Rash    Peeling skin   Doxycycline Other (See Comments)    Headache. "Pressure on the brain''   Montelukast Sodium Palpitations and Other (See Comments)    Immunization History  Administered Date(s) Administered   Fluad Quad(high Dose 65+) 05/03/2021, 06/03/2022   Hep A / Hep B 05/15/2021, 06/17/2021, 11/14/2021   Influenza, Quadrivalent, Recombinant, Inj, Pf 07/21/2019, 06/12/2020   Influenza-Unspecified 07/06/2015   PFIZER(Purple Top)SARS-COV-2 Vaccination 10/21/2019, 11/11/2019   Pneumococcal Conjugate-13 11/09/2017   Tdap 10/27/2018    Past Surgical History:  Procedure Laterality Date   BREAST BIOPSY Left    neg   COLONOSCOPY  2004   COLONOSCOPY WITH PROPOFOL N/A 06/19/2021   Procedure: COLONOSCOPY WITH PROPOFOL;  Surgeon: Pasty Spillers, MD;  Location: [facility] ENDOSCOPY;  Service: Endoscopy;  Laterality: N/A;   ESOPHAGOGASTRODUODENOSCOPY (EGD) WITH PROPOFOL N/A 12/05/2020   Procedure: ESOPHAGOGASTRODUODENOSCOPY (EGD) WITH PROPOFOL;  Surgeon: Pasty Spillers, MD;  Location: [facility] ENDOSCOPY;  Service: Endoscopy;  Laterality: N/A;   TONSILLECTOMY     UPPER GI ENDOSCOPY  12/2018    Social History   Tobacco Use    Smoking status: Former    Current packs/day: 0.00    Average packs/day: 1.5 packs/day for 5.6 years (8.4 ttl pk-yrs)    Types: Cigarettes    Start date: 56    Quit date: 04/02/1975    Years since quitting: 47.9   Smokeless tobacco: Never   Tobacco comments:    smoking cessation materials not required  Vaping Use   Vaping status: Never Used  Substance Use Topics   Alcohol use: No    Alcohol/week: 0.0 standard drinks of alcohol   Drug use: No    Family History  Problem Relation Age of Onset   Hypertension Mother    CAD Father    Heart attack Father    Breast cancer Neg Hx    Colon cancer Neg Hx         03/19/2023    2:15 PM 02/26/2023   10:03 AM 07/11/2022   10:41 AM 03/10/2022    1:34 PM  GAD 7 : Generalized Anxiety Score  Nervous, Anxious, on Edge 1 0 0 0  Control/stop worrying 0  0 0  Worry too much - different things 0 0 0 0  Trouble relaxing 0 1 0 0  Restless 0 0 0 0  Easily annoyed or irritable 0 0 0 0  Afraid - awful might happen 0 0 0 0  Total GAD 7 Score 1  0 0  Anxiety Difficulty Not difficult at all Not difficult at all Not difficult at all Not difficult at all       03/19/2023    2:14 PM 02/26/2023   10:03 AM 07/16/2022    2:02 PM  Depression screen PHQ 2/9  Decreased Interest 0 0 0  Down, Depressed, Hopeless 0 0 0  PHQ - 2 Score 0 0 0  Altered sleeping 0 1 1  Tired, decreased energy 0 0 0  Change in appetite 1 0 0  Feeling bad or failure about yourself  0 0 0  Trouble concentrating 0 0 0  Moving slowly or fidgety/restless 0 0 0  Suicidal thoughts 0 0 0  PHQ-9 Score 1 1 1   Difficult doing work/chores Not difficult at all Not difficult at all Not difficult at all    BP Readings from Last 3 Encounters:  03/19/23 100/64  02/26/23 122/72  02/16/23 (!) 144/81    Wt Readings from Last 3 Encounters:  03/19/23 128 lb (58.1 kg)  02/26/23 129 lb 3.2 oz (58.6 kg)  07/16/22 126 lb (57.2 kg)    BP 100/64   Pulse 78   Temp (!) 97.4 F (36.3 C)  (Oral)   Ht 5\' 8"  (1.727 m)   Wt 128 lb (58.1 kg)   SpO2 95%   BMI 19.46 kg/m   Physical Exam Vitals and nursing note reviewed.  Constitutional:      Appearance: Normal appearance.  Cardiovascular:     Rate and Rhythm: Normal rate and regular rhythm.     Heart sounds: No murmur heard.    No friction rub. No gallop.  Pulmonary:     Effort: Pulmonary effort is normal.     Breath sounds: Normal breath sounds.  Abdominal:     General: Bowel sounds are normal. There is distension.     Palpations: Abdomen is soft. There is hepatomegaly (borderline). There is no fluid wave, mass or pulsatile mass.     Tenderness: There is no abdominal tenderness.     Hernia: No hernia is present.  Musculoskeletal:        General: Normal range of motion.  Skin:    General: Skin is warm and dry.  Neurological:     Mental Status: She is alert and oriented to person, place, and time.     Gait: Gait is intact.  Psychiatric:        Mood and Affect: Mood and affect normal.     Recent Labs     Component Value Date/Time   NA 137 03/27/2021 1556   NA 137 02/25/2019 1410   NA 138 12/05/2013 0800   K 3.9 03/27/2021 1556   K 3.6 12/05/2013 0800   CL 98 03/27/2021 1556   CL 105 12/05/2013 0800   CO2 28 03/27/2021 1556   CO2 28 12/05/2013 0800   GLUCOSE 94 03/27/2021 1556   GLUCOSE 111 (H) 12/05/2013 0800   BUN 16 03/27/2021 1556   BUN 18 02/25/2019 1410   BUN 9 12/05/2013 0800   CREATININE 0.76 03/27/2021 1556  CREATININE 0.82 12/05/2013 0800   CALCIUM 9.4 03/27/2021 1556   CALCIUM 8.5 12/05/2013 0800   PROT 7.1 03/26/2021 1406   PROT 6.9 02/25/2019 1410   PROT 6.6 12/05/2013 0800   ALBUMIN 4.1 03/26/2021 1406   ALBUMIN 4.5 02/25/2019 1410   ALBUMIN 3.4 12/05/2013 0800   AST 34 03/26/2021 1406   AST 29 12/05/2013 0800   ALT 37 03/26/2021 1406   ALT 27 12/05/2013 0800   ALKPHOS 85 03/26/2021 1406   ALKPHOS 66 12/05/2013 0800   BILITOT 1.4 (H) 03/26/2021 1406   BILITOT 0.5 02/25/2019  1410   BILITOT 1.4 (H) 12/05/2013 0800   GFRNONAA >60 03/27/2021 1556   GFRNONAA >60 12/05/2013 0800   GFRAA 72 02/25/2019 1410   GFRAA >60 12/05/2013 0800    Lab Results  Component Value Date   WBC 6.6 03/27/2021   HGB 13.8 03/27/2021   HCT 41.6 03/27/2021   MCV 92.4 03/27/2021   PLT 187 03/27/2021   Lab Results  Component Value Date   HGBA1C 5.6 08/21/2021   Lab Results  Component Value Date   CHOL 189 03/20/2021   TRIG 60 03/20/2021   Lab Results  Component Value Date   TSH 2.130 11/19/2017     Assessment and Plan:  1. Abdominal distention Check labs as below.  Referring to GI as requested by patient.  Obtain repeat RUQ ultrasound with elastography/FibroScan - CBC with Differential/Platelet - Comprehensive metabolic panel - Lipid panel - Lipase - Amylase - Ambulatory referral to Gastroenterology - US ABDOMEN RUQ W/ELASTOGRAPHY  2. Loose stools Check labs as below.  Referring to GI as requested by patient.  Obtain repeat RUQ ultrasound with elastography/FibroScan - CBC with Differential/Platelet - Comprehensive metabolic panel - Lipid panel - Lipase - Amylase - Ambulatory referral to Gastroenterology  3. NAFLD (nonalcoholic fatty liver disease) Check labs as below.  Referring to GI as requested by patient.  Obtain repeat RUQ ultrasound with elastography/FibroScan - CBC with Differential/Platelet - Comprehensive metabolic panel - Lipid panel - Ambulatory referral to Gastroenterology - US ABDOMEN RUQ W/ELASTOGRAPHY  4. Hiatal hernia Check labs as below.  Referring to GI as requested by patient.  Obtain repeat RUQ ultrasound with elastography/FibroScan   F/u TBD pending today's labs.    Alvester Morin, PA-C, DMSc, Nutritionist [facility] [phone]

## 2023-03-19 NOTE — Patient Instructions (Signed)
-  It was a pleasure to see you today! Please review your visit summary for helpful information -Lab results are usually available within 1-2 days and we will call once reviewed -I would encourage you to follow your care via MyChart where you can access lab results, notes, messages, and more -If you feel that we did a nice job today, please complete your after-visit survey and leave us a Google review! Your CMA today was Kieandra and your provider was Dan Waddell, PA-C, DMSc  

## 2023-03-19 NOTE — Telephone Encounter (Signed)
  Chief Complaint: Abdominal bloating - yellow stool Symptoms: above Frequency: 2 years - gotten worse the past 2 weeks Pertinent Negatives: Patient denies  Disposition: [] ED /[] Urgent Care (no appt availability in office) / [x] Appointment(In office/virtual)/ []  [facility]/ [] Home Care/ [] Refused Recommended Disposition /[] [facility]/ []  Follow-up with PCP Additional Notes: Pt states she was diagnosed with fatty liver 2 years ago. Pt has made dietary changes and started taking OTC supplement call [employer], which seems to help with pain.  Recently pt is again having abdominal discomfort, bloating and yellow stools. Pt would like liver labs run, and a referral to [facility] at the [place] clinic.

## 2023-03-20 LAB — CBC WITH DIFFERENTIAL/PLATELET
Basophils Absolute: 0 10*3/uL (ref 0.0–0.2)
Basos: 0 %
EOS (ABSOLUTE): 0.1 10*3/uL (ref 0.0–0.4)
Eos: 1 %
Hematocrit: 41.1 % (ref 34.0–46.6)
Hemoglobin: 13.3 g/dL (ref 11.1–15.9)
Immature Grans (Abs): 0 10*3/uL (ref 0.0–0.1)
Immature Granulocytes: 0 %
Lymphocytes Absolute: 1.3 10*3/uL (ref 0.7–3.1)
Lymphs: 20 %
MCH: 29.7 pg (ref 26.6–33.0)
MCHC: 32.4 g/dL (ref 31.5–35.7)
MCV: 92 fL (ref 79–97)
Monocytes Absolute: 0.4 10*3/uL (ref 0.1–0.9)
Monocytes: 6 %
Neutrophils Absolute: 4.6 10*3/uL (ref 1.4–7.0)
Neutrophils: 73 %
Platelets: 218 10*3/uL (ref 150–450)
RBC: 4.48 x10E6/uL (ref 3.77–5.28)
RDW: 11.9 % (ref 11.7–15.4)
WBC: 6.3 10*3/uL (ref 3.4–10.8)

## 2023-03-20 LAB — COMPREHENSIVE METABOLIC PANEL
ALT: 18 IU/L (ref 0–32)
AST: 24 IU/L (ref 0–40)
Albumin: 4.2 g/dL (ref 3.8–4.8)
Alkaline Phosphatase: 95 IU/L (ref 44–121)
BUN/Creatinine Ratio: 15 (ref 12–28)
BUN: 17 mg/dL (ref 8–27)
Bilirubin Total: 0.7 mg/dL (ref 0.0–1.2)
CO2: 27 mmol/L (ref 20–29)
Calcium: 9.4 mg/dL (ref 8.7–10.3)
Chloride: 97 mmol/L (ref 96–106)
Creatinine, Ser: 1.12 mg/dL — ABNORMAL HIGH (ref 0.57–1.00)
Globulin, Total: 2.3 g/dL (ref 1.5–4.5)
Glucose: 80 mg/dL (ref 70–99)
Potassium: 4.4 mmol/L (ref 3.5–5.2)
Sodium: 137 mmol/L (ref 134–144)
Total Protein: 6.5 g/dL (ref 6.0–8.5)
eGFR: 52 mL/min/{1.73_m2} — ABNORMAL LOW (ref >59)

## 2023-03-20 LAB — LIPID PANEL
Chol/HDL Ratio: 2.2 ratio (ref 0.0–4.4)
Cholesterol, Total: 185 mg/dL (ref 100–199)
HDL: 84 mg/dL (ref >39)
LDL Chol Calc (NIH): 88 mg/dL (ref 0–99)
Triglycerides: 67 mg/dL (ref 0–149)
VLDL Cholesterol Cal: 13 mg/dL (ref 5–40)

## 2023-03-20 LAB — AMYLASE: Amylase: 56 U/L (ref 31–110)

## 2023-03-20 LAB — LIPASE: Lipase: 24 U/L (ref 14–85)

## 2023-03-20 NOTE — Telephone Encounter (Signed)
FYI  KP

## 2023-03-20 NOTE — Progress Notes (Signed)
Pt viewed labs on Mychart. Pt sent message via mychart.  KP

## 2023-03-23 ENCOUNTER — Other Ambulatory Visit: Payer: Self-pay

## 2023-03-23 ENCOUNTER — Other Ambulatory Visit: Payer: Self-pay | Admitting: Internal Medicine

## 2023-03-23 DIAGNOSIS — R7989 Other specified abnormal findings of blood chemistry: Secondary | ICD-10-CM

## 2023-03-23 NOTE — Telephone Encounter (Signed)
Please review.  KP

## 2023-03-24 LAB — AMMONIA: Ammonia: 21 ug/dL — ABNORMAL LOW (ref 31–169)

## 2023-03-25 NOTE — Telephone Encounter (Signed)
FYI  KP

## 2023-04-02 ENCOUNTER — Ambulatory Visit
Admission: RE | Admit: 2023-04-02 | Discharge: 2023-04-02 | Disposition: A | Discharge status: Home / Self Care | Payer: Medicare Other | Source: Ambulatory Visit | Attending: Physician Assistant | Admitting: Physician Assistant

## 2023-04-02 DIAGNOSIS — K760 Fatty (change of) liver, not elsewhere classified: Secondary | ICD-10-CM | POA: Diagnosis not present

## 2023-04-02 DIAGNOSIS — K709 Alcoholic liver disease, unspecified: Secondary | ICD-10-CM | POA: Diagnosis not present

## 2023-04-02 DIAGNOSIS — R140 Abdominal distension (gaseous): Secondary | ICD-10-CM | POA: Insufficient documentation

## 2023-04-02 DIAGNOSIS — Z944 Liver transplant status: Secondary | ICD-10-CM | POA: Diagnosis not present

## 2023-04-02 DIAGNOSIS — R932 Abnormal findings on diagnostic imaging of liver and biliary tract: Secondary | ICD-10-CM | POA: Diagnosis not present

## 2023-04-08 NOTE — Telephone Encounter (Signed)
Please review.  KP

## 2023-04-21 ENCOUNTER — Ambulatory Visit: Payer: Self-pay

## 2023-04-21 NOTE — Telephone Encounter (Signed)
Chief Complaint: abdominal pain, lower Symptoms: 9/10 pain around to center of back, dark urine, swelling lower abdomen, coughing up blood sometimes after eating, not able to eat a lot Frequency: 3 weeks ago off and on Pertinent Negatives: Patient denies other symptoms Disposition: [] ED /[] Urgent Care (no appt availability in office) / [x] Appointment(In office/virtual)/ []  [facility]/ [] Home Care/ [] Refused Recommended Disposition /[] [facility]/ []  Follow-up with PCP Additional Notes: Advised OV tomorrow, agrees to schedule with Reuel Boom.   Reason for Disposition  [1] MODERATE pain (e.g., interferes with normal activities) AND [2] pain comes and goes (cramps) AND [3] present > 24 hours  (Exception: Pain with Vomiting or Diarrhea - see that Guideline.)  Answer Assessment - Initial Assessment Questions 1. LOCATION: "Where does it hurt?"      Lower abdomen 2. RADIATION: "Does the pain shoot anywhere else?" (e.g., chest, back)     Center of Back  3. ONSET: "When did the pain begin?" (e.g., minutes, hours or days ago)      3 weeks ago 4. SUDDEN: "Gradual or sudden onset?"     Gradual 5. PATTERN "Does the pain come and go, or is it constant?"    - If it comes and goes: "How long does it last?" "Do you have pain now?"     (Note: Comes and goes means the pain is intermittent. It goes away completely between bouts.)    - If constant: "Is it getting better, staying the same, or getting worse?"      (Note: Constant means the pain never goes away completely; most serious pain is constant and gets worse.)      Comes and goes 6. SEVERITY: "How bad is the pain?"  (e.g., Scale 1-10; mild, moderate, or severe)    - MILD (1-3): Doesn't interfere with normal activities, abdomen soft and not tender to touch.     - MODERATE (4-7): Interferes with normal activities or awakens from sleep, abdomen tender to touch.     - SEVERE (8-10): Excruciating pain, doubled over, unable to do any  normal activities.       9 7. RECURRENT SYMPTOM: "Have you ever had this type of stomach pain before?" If Yes, ask: "When was the last time?" and "What happened that time?"      No 8. CAUSE: "What do you think is causing the stomach pain?"     Not sure 9. RELIEVING/AGGRAVATING FACTORS: "What makes it better or worse?" (e.g., antacids, bending or twisting motion, bowel movement)     Taking a walk 10. OTHER SYMPTOMS: "Do you have any other symptoms?" (e.g., back pain, diarrhea, fever, urination pain, vomiting)       Coughing up blood after meals, abd swelling, flank pain, dark urine  Protocols used: Abdominal Pain - Female-A-AH

## 2023-04-22 ENCOUNTER — Ambulatory Visit (INDEPENDENT_AMBULATORY_CARE_PROVIDER_SITE_OTHER): Payer: Medicare Other | Admitting: Physician Assistant

## 2023-04-22 ENCOUNTER — Encounter: Payer: Self-pay | Admitting: Physician Assistant

## 2023-04-22 VITALS — BP 108/76 | HR 69 | Temp 98.0°F | Ht 68.0 in | Wt 124.0 lb

## 2023-04-22 DIAGNOSIS — R7989 Other specified abnormal findings of blood chemistry: Secondary | ICD-10-CM

## 2023-04-22 DIAGNOSIS — R195 Other fecal abnormalities: Secondary | ICD-10-CM

## 2023-04-22 DIAGNOSIS — R1030 Lower abdominal pain, unspecified: Secondary | ICD-10-CM

## 2023-04-22 DIAGNOSIS — Z1321 Encounter for screening for nutritional disorder: Secondary | ICD-10-CM | POA: Diagnosis not present

## 2023-04-22 DIAGNOSIS — M810 Age-related osteoporosis without current pathological fracture: Secondary | ICD-10-CM

## 2023-04-22 LAB — POCT URINALYSIS DIPSTICK
Bilirubin, UA: NEGATIVE
Glucose, UA: NEGATIVE
Ketones, UA: NEGATIVE
Leukocytes, UA: NEGATIVE
Nitrite, UA: NEGATIVE
Protein, UA: NEGATIVE
Spec Grav, UA: <=1.005 — AB (ref 1.010–1.025)
Urobilinogen, UA: 0.2 E.U./dL
pH, UA: 6 (ref 5.0–8.0)

## 2023-04-22 MED ORDER — DICYCLOMINE HCL 20 MG PO TABS
20.0000 mg | ORAL_TABLET | Freq: Four times a day (QID) | ORAL | 1 refills | Status: AC | PRN
Start: 2023-04-22 — End: ?

## 2023-04-22 NOTE — Patient Instructions (Signed)
-  It was a pleasure to see you today! Please review your visit summary for helpful information -Lab results are usually available within 1-2 days and we will call once reviewed -I would encourage you to follow your care via MyChart where you can access lab results, notes, messages, and more -If you feel that we did a nice job today, please complete your after-visit survey and leave us a Google review! Your CMA today was Kieandra and your provider was Dan Waddell, PA-C, DMSc  

## 2023-04-22 NOTE — Progress Notes (Signed)
Date:  04/22/2023   Name:  Audrey Hayden   DOB:  03/27/1951   MRN:  [id number]   Chief Complaint: Abdominal Pain (Wondering if it is her kidneys )  Abdominal Pain This is a recurrent problem. Episode frequency: comes and goes. The problem has been unchanged. The pain is located in the RLQ and LLQ. The pain is at a severity of 7/10. The pain is moderate. The quality of the pain is aching and dull. The abdominal pain does not radiate. Associated symptoms include vomiting. Pertinent negatives include no constipation, diarrhea, fever or nausea. Associated symptoms comments: After meals. Exacerbated by: eating fatty foods. Relieved by: walking. She has tried nothing for the symptoms.   Audrey Hayden returns for 1 month follow-up on abdominal bloating and lower abdominal pain, largely unchanged from her previous visit.  She now has an appoint with GI Dr. Bing Plume, but is not until January 2025.  Incidental finding of elevated creatinine on last labs, due for repeat today, patient somewhat concerned by this.  Occasional nausea/vomiting.  Normal RUQ ultrasound 04/02/2023.  Currently complains of lower abdominal pain, would like to check for UTI today.  Denies vaginal bleeding, hematuria, dysuria.  Says her urine seems little dark.  BM typically once per day, occasionally twice per day, loose, yellowish.  Last colonoscopy normal 2022, negative for IBD or microscopic colitis.  IBS has been mentioned on prior chart notes but she has never tried medication for this.  She says symptoms are affecting her regular activities.   (PRIOR NOTE 03/19/23): Audrey Hayden is a pleasant 72 year old female with a history of many abdominal complaints new to me today, typically sees my colleague Dr. Bari Edward, MD, here to discuss acute on chronic abdominal distention/bloating.  She says this problem started about 2 years ago, around the time she was diagnosed with fatty liver disease, and the bloating was initially painful but she  started taking a liver supplement from [employer] and currently states the bloating is painless.  Seems to be mainly postprandial. She has regular BM, although she has noticed a stool color change over the last 2 weeks and they are now yellow and loose. Chart review shows last hepatic function panel 2 years ago with slightly elevated total and indirect bilirubin.  Around the same time she had CT abdomen and pelvis with contrast as well as RUQ ultrasound; this revealed increased echogenicity of the liver, trace ascites, and periportal edema.  FibroSure panel run by GI provider at the time Dr. Maximino Greenland was fairly reassuring with no evidence of fibrosis/steatosis.  Colonoscopy October 2022 was unremarkable.  Have an interesting EGD April 2022 with finding of moderate Schatzki ring, mild hiatal hernia, various polyps and areas of erythema - biopsies were negative for malignancy, dysplasia, H. pylori.  Some concern for IBS vs SIBO on chart review   Medication list has been reviewed and updated.  Current Meds  Medication Sig   cetirizine (ZYRTEC) 10 MG tablet Take 10 mg by mouth daily.   dicyclomine (BENTYL) 20 MG tablet Take 1 tablet (20 mg total) by mouth every 6 (six) hours as needed (abdominal bloating, cramping, or pain).   Homeopathic Products (LIVER SUPPORT SL) Place under the tongue.   Multiple Vitamins-Minerals (ALIVE WOMENS 50+ PO)    Pancreatin 500 MG CAPS Take by mouth. Digestive enzymes   Probiotic Product (PROBIOTIC BLEND PO) in the morning, at noon, and at bedtime.   Quercetin 250 MG TABS Take 500 mg by mouth at  bedtime.   [DISCONTINUED] LYSINE PO Take by mouth.     Review of Systems  Constitutional:  Negative for fatigue and fever.  Respiratory:  Negative for chest tightness and shortness of breath.   Cardiovascular:  Negative for chest pain and palpitations.  Gastrointestinal:  Positive for abdominal distention, abdominal pain and vomiting. Negative for blood in stool,  constipation, diarrhea, nausea and rectal pain.    Patient Active Problem List   Diagnosis Date Noted   Hiatal hernia 03/19/2023   Loose stools    Loss of weight    Thyroid nodule 05/03/2021   Allergic rhinitis 04/11/2021   Adverse reaction to food 04/11/2021   Esophageal dysphagia    Schatzki's ring    Gastric erythema    Asymptomatic spider veins of both lower extremities 02/25/2019   Chronic left-sided thoracic back pain 12/09/2017   Anxiety 12/09/2017   Environmental and seasonal allergies 06/19/2015   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/19/2015   Osteoporosis 06/19/2015   Abnormal Pap smear of cervix 06/19/2015   Elevated HDL 06/19/2015   History of gastritis 11/08/2014   Cough, persistent 08/14/2014    Allergies  Allergen Reactions   Egg-Derived Products Itching and Rash   Amoxicillin Other (See Comments)    Tingling around the mouth   Azelastine Hcl Other (See Comments)   Cefdinir     Other reaction(s): Other (See Comments) Bright red face and ankle swelling: possible reaction to the medicine.    Clarithromycin Other (See Comments)    Causes her to faint.   Codeine Other (See Comments)    Causes her GI upset.   Hydromorphone Hcl Other (See Comments)    Unsure of reaction   Hydromorphone Hcl Other (See Comments)   Ibuprofen Other (See Comments)   Meperidine Other (See Comments)    Unsure of reaction   Montelukast Other (See Comments)   Moxifloxacin Other (See Comments)    Dizziness    Nsaids     Other reaction(s): Unknown   Omeprazole Other (See Comments)    Ears ringing with more than 20mg /day   Other Other (See Comments)    All profins cause her to faint. All the mycins cause her to faint.   Prednisone Other (See Comments)    Bright red face and ankle swelling: possible reaction to the medicine.   Pseudoephedrine Other (See Comments)   Pseudoephedrine Hcl     Other reaction(s): Syncope   Ranitidine     Other reaction(s): Other (See  Comments) SE noted with high dosage Dk urine and stools, HA   Salicylates Hives   Aspirin Hives and Rash   Dexilant [Dexlansoprazole] Rash    Peeling skin   Doxycycline Other (See Comments)    Headache. "Pressure on the brain''   Montelukast Sodium Palpitations and Other (See Comments)    Immunization History  Administered Date(s) Administered   Fluad Quad(high Dose 65+) 05/03/2021, 06/03/2022   Hep A / Hep B 05/15/2021, 06/17/2021, 11/14/2021   Influenza, Quadrivalent, Recombinant, Inj, Pf 07/21/2019, 06/12/2020   Influenza-Unspecified 07/06/2015   PFIZER(Purple Top)SARS-COV-2 Vaccination 10/21/2019, 11/11/2019   Pneumococcal Conjugate-13 11/09/2017   Tdap 10/27/2018    Past Surgical History:  Procedure Laterality Date   BREAST BIOPSY Left    neg   COLONOSCOPY  2004   COLONOSCOPY WITH PROPOFOL N/A 06/19/2021   Procedure: COLONOSCOPY WITH PROPOFOL;  Surgeon: Pasty Spillers, MD;  Location: [facility] ENDOSCOPY;  Service: Endoscopy;  Laterality: N/A;   ESOPHAGOGASTRODUODENOSCOPY (EGD) WITH PROPOFOL N/A  12/05/2020   Procedure: ESOPHAGOGASTRODUODENOSCOPY (EGD) WITH PROPOFOL;  Surgeon: Pasty Spillers, MD;  Location: [facility] ENDOSCOPY;  Service: Endoscopy;  Laterality: N/A;   TONSILLECTOMY     UPPER GI ENDOSCOPY  12/2018    Social History   Tobacco Use   Smoking status: Former    Current packs/day: 0.00    Average packs/day: 1.5 packs/day for 5.6 years (8.4 ttl pk-yrs)    Types: Cigarettes    Start date: 3    Quit date: 04/02/1975    Years since quitting: 48.0   Smokeless tobacco: Never   Tobacco comments:    smoking cessation materials not required  Vaping Use   Vaping status: Never Used  Substance Use Topics   Alcohol use: No    Alcohol/week: 0.0 standard drinks of alcohol   Drug use: No    Family History  Problem Relation Age of Onset   Hypertension Mother    CAD Father    Heart attack Father    Breast cancer Neg Hx    Colon cancer Neg Hx          04/22/2023   10:54 AM 03/19/2023    2:15 PM 02/26/2023   10:03 AM 07/11/2022   10:41 AM  GAD 7 : Generalized Anxiety Score  Nervous, Anxious, on Edge 0 1 0 0  Control/stop worrying 0 0  0  Worry too much - different things 0 0 0 0  Trouble relaxing 1 0 1 0  Restless 0 0 0 0  Easily annoyed or irritable 0 0 0 0  Afraid - awful might happen 0 0 0 0  Total GAD 7 Score 1 1  0  Anxiety Difficulty Not difficult at all Not difficult at all Not difficult at all Not difficult at all       04/22/2023   10:54 AM 03/19/2023    2:14 PM 02/26/2023   10:03 AM  Depression screen PHQ 2/9  Decreased Interest 0 0 0  Down, Depressed, Hopeless 0 0 0  PHQ - 2 Score 0 0 0  Altered sleeping 0 0 1  Tired, decreased energy 0 0 0  Change in appetite 0 1 0  Feeling bad or failure about yourself  0 0 0  Trouble concentrating 0 0 0  Moving slowly or fidgety/restless 0 0 0  Suicidal thoughts 0 0 0  PHQ-9 Score 0 1 1  Difficult doing work/chores Not difficult at all Not difficult at all Not difficult at all    BP Readings from Last 3 Encounters:  04/22/23 108/76  03/19/23 100/64  02/26/23 122/72    Wt Readings from Last 3 Encounters:  04/22/23 124 lb (56.2 kg)  03/19/23 128 lb (58.1 kg)  02/26/23 129 lb 3.2 oz (58.6 kg)    BP 108/76   Pulse 69   Temp 98 F (36.7 C) (Oral)   Ht 5\' 8"  (1.727 m)   Wt 124 lb (56.2 kg)   SpO2 98%   BMI 18.85 kg/m   Physical Exam Vitals and nursing note reviewed.  Constitutional:      Appearance: Normal appearance.  Cardiovascular:     Rate and Rhythm: Normal rate and regular rhythm.     Heart sounds: No murmur heard.    No friction rub. No gallop.  Pulmonary:     Effort: Pulmonary effort is normal.     Breath sounds: Normal breath sounds.  Abdominal:     General: Abdomen is flat. Bowel sounds are increased. There is  no distension or abdominal bruit.     Palpations: Abdomen is soft. There is hepatomegaly. There is no fluid wave, mass or pulsatile mass.      Tenderness: There is abdominal tenderness in the left lower quadrant. There is no right CVA tenderness, left CVA tenderness, guarding or rebound. Negative signs include Murphy's sign, Rovsing's sign and McBurney's sign.     Hernia: No hernia is present.  Musculoskeletal:        General: Normal range of motion.  Skin:    General: Skin is warm and dry.  Neurological:     Mental Status: She is alert and oriented to person, place, and time.     Gait: Gait is intact.  Psychiatric:        Mood and Affect: Mood and affect normal.       Recent Labs     Component Value Date/Time   NA 137 03/19/2023 1455   NA 138 12/05/2013 0800   K 4.4 03/19/2023 1455   K 3.6 12/05/2013 0800   CL 97 03/19/2023 1455   CL 105 12/05/2013 0800   CO2 27 03/19/2023 1455   CO2 28 12/05/2013 0800   GLUCOSE 80 03/19/2023 1455   GLUCOSE 94 03/27/2021 1556   GLUCOSE 111 (H) 12/05/2013 0800   BUN 17 03/19/2023 1455   BUN 9 12/05/2013 0800   CREATININE 1.12 (H) 03/19/2023 1455   CREATININE 0.82 12/05/2013 0800   CALCIUM 9.4 03/19/2023 1455   CALCIUM 8.5 12/05/2013 0800   PROT 6.5 03/19/2023 1455   PROT 6.6 12/05/2013 0800   ALBUMIN 4.2 03/19/2023 1455   ALBUMIN 3.4 12/05/2013 0800   AST 24 03/19/2023 1455   AST 29 12/05/2013 0800   ALT 18 03/19/2023 1455   ALT 27 12/05/2013 0800   ALKPHOS 95 03/19/2023 1455   ALKPHOS 66 12/05/2013 0800   BILITOT 0.7 03/19/2023 1455   BILITOT 1.4 (H) 12/05/2013 0800   GFRNONAA >60 03/27/2021 1556   GFRNONAA >60 12/05/2013 0800   GFRAA 72 02/25/2019 1410   GFRAA >60 12/05/2013 0800    Lab Results  Component Value Date   WBC 6.3 03/19/2023   HGB 13.3 03/19/2023   HCT 41.1 03/19/2023   MCV 92 03/19/2023   PLT 218 03/19/2023   Lab Results  Component Value Date   HGBA1C 5.6 08/21/2021   Lab Results  Component Value Date   CHOL 185 03/19/2023   HDL 84 03/19/2023   LDLCALC 88 03/19/2023   TRIG 67 03/19/2023   CHOLHDL 2.2 03/19/2023   Lab Results   Component Value Date   TSH 2.130 11/19/2017     Assessment and Plan:  1. Lower abdominal pain Based on symptoms and hyperactive bowel sounds on today's exam, IBS is high in the differential.  Try dicyclomine up to QID PRN to see if any improvement.   Urine dipstick with small blood but otherwise normal. No suspicion for UTI.   - dicyclomine (BENTYL) 20 MG tablet; Take 1 tablet (20 mg total) by mouth every 6 (six) hours as needed (abdominal bloating, cramping, or pain).  Dispense: 120 tablet; Refill: 1 - POCT urinalysis dipstick  2. Loose stools Plan as above - dicyclomine (BENTYL) 20 MG tablet; Take 1 tablet (20 mg total) by mouth every 6 (six) hours as needed (abdominal bloating, cramping, or pain).  Dispense: 120 tablet; Refill: 1  3. Elevated serum creatinine Repeat BMP today.  No CVA tenderness - Basic metabolic panel  4. Age-related osteoporosis without current pathological  fracture Check vitamin D, as it has been almost 2 years since last check with history of vitamin D deficiency. - VITAMIN D 25 Hydroxy (Vit-D Deficiency, Fractures)  5. Encounter for vitamin deficiency screening Check vitamin D, as it has been almost 2 years since last check with history of vitamin D deficiency. - VITAMIN D 25 Hydroxy (Vit-D Deficiency, Fractures)   F/u PRN/TBD pending labs   Alvester Morin, PA-C, DMSc, Nutritionist [facility] Primary Care and Sports Medicine [facility] [phone]

## 2023-04-23 LAB — BASIC METABOLIC PANEL
BUN/Creatinine Ratio: 18 (ref 12–28)
BUN: 16 mg/dL (ref 8–27)
CO2: 27 mmol/L (ref 20–29)
Calcium: 9.8 mg/dL (ref 8.7–10.3)
Chloride: 96 mmol/L (ref 96–106)
Creatinine, Ser: 0.88 mg/dL (ref 0.57–1.00)
Glucose: 94 mg/dL (ref 70–99)
Potassium: 4 mmol/L (ref 3.5–5.2)
Sodium: 136 mmol/L (ref 134–144)
eGFR: 70 mL/min/{1.73_m2} (ref >59)

## 2023-04-23 LAB — VITAMIN D 25 HYDROXY (VIT D DEFICIENCY, FRACTURES): Vit D, 25-Hydroxy: 82.7 ng/mL (ref 30.0–100.0)

## 2023-04-24 NOTE — Telephone Encounter (Signed)
fyi

## 2023-05-26 ENCOUNTER — Encounter: Payer: Self-pay | Admitting: Dermatology

## 2023-05-26 ENCOUNTER — Ambulatory Visit (INDEPENDENT_AMBULATORY_CARE_PROVIDER_SITE_OTHER): Payer: Medicare Other | Admitting: Dermatology

## 2023-05-26 DIAGNOSIS — R238 Other skin changes: Secondary | ICD-10-CM | POA: Diagnosis not present

## 2023-05-26 DIAGNOSIS — D1801 Hemangioma of skin and subcutaneous tissue: Secondary | ICD-10-CM | POA: Diagnosis not present

## 2023-05-26 DIAGNOSIS — W908XXA Exposure to other nonionizing radiation, initial encounter: Secondary | ICD-10-CM

## 2023-05-26 DIAGNOSIS — D692 Other nonthrombocytopenic purpura: Secondary | ICD-10-CM | POA: Diagnosis not present

## 2023-05-26 DIAGNOSIS — L821 Other seborrheic keratosis: Secondary | ICD-10-CM | POA: Diagnosis not present

## 2023-05-26 DIAGNOSIS — D229 Melanocytic nevi, unspecified: Secondary | ICD-10-CM

## 2023-05-26 DIAGNOSIS — I8393 Asymptomatic varicose veins of bilateral lower extremities: Secondary | ICD-10-CM

## 2023-05-26 DIAGNOSIS — Z85820 Personal history of malignant melanoma of skin: Secondary | ICD-10-CM | POA: Diagnosis not present

## 2023-05-26 DIAGNOSIS — I781 Nevus, non-neoplastic: Secondary | ICD-10-CM | POA: Diagnosis not present

## 2023-05-26 DIAGNOSIS — L817 Pigmented purpuric dermatosis: Secondary | ICD-10-CM | POA: Diagnosis not present

## 2023-05-26 DIAGNOSIS — L814 Other melanin hyperpigmentation: Secondary | ICD-10-CM | POA: Diagnosis not present

## 2023-05-26 DIAGNOSIS — Z1283 Encounter for screening for malignant neoplasm of skin: Secondary | ICD-10-CM

## 2023-05-26 DIAGNOSIS — L578 Other skin changes due to chronic exposure to nonionizing radiation: Secondary | ICD-10-CM | POA: Diagnosis not present

## 2023-05-26 DIAGNOSIS — Z86018 Personal history of other benign neoplasm: Secondary | ICD-10-CM

## 2023-05-26 NOTE — Patient Instructions (Signed)

## 2023-05-26 NOTE — Progress Notes (Signed)
Follow-Up Visit   Subjective  Audrey Hayden is a 72 y.o. female who presents for the following: Skin Cancer Screening and Full Body Skin Exam  The patient presents for Total-Body Skin Exam (TBSE) for skin cancer screening and mole check. The patient has spots, moles and lesions to be evaluated, some may be new or changing and the patient may have concern these could be cancer.  Patient with hx of melanoma, dysplastic nevus. She does have a spot at left ankle that is red and brown, noticed recently.   The following portions of the chart were reviewed this encounter and updated as appropriate: medications, allergies, medical history  Review of Systems:  No other skin or systemic complaints except as noted in HPI or Assessment and Plan.  Objective  Well appearing patient in no apparent distress; mood and affect are within normal limits.  A full examination was performed including scalp, head, eyes, ears, nose, lips, neck, chest, axillae, abdomen, back, buttocks, bilateral upper extremities, bilateral lower extremities, hands, feet, fingers, toes, fingernails, and toenails. All findings within normal limits unless otherwise noted below.   Relevant physical exam findings are noted in the Assessment and Plan.    Assessment & Plan   SKIN CANCER SCREENING PERFORMED TODAY.  ACTINIC DAMAGE - Chronic condition, secondary to cumulative UV/sun exposure - diffuse scaly erythematous macules with underlying dyspigmentation - Recommend daily broad spectrum sunscreen SPF 30+ to sun-exposed areas, reapply every 2 hours as needed.  - Staying in the shade or wearing long sleeves, sun glasses (UVA+UVB protection) and wide brim hats (4-inch brim around the entire circumference of the hat) are also recommended for sun protection.  - Call for new or changing lesions.  LENTIGINES, SEBORRHEIC KERATOSES, HEMANGIOMAS - Benign normal skin lesions - Benign-appearing - Call for any changes  MELANOCYTIC  NEVI - Tan-brown and/or pink-flesh-colored symmetric macules and papules - Benign appearing on exam today - Observation - Call clinic for new or changing moles - Recommend daily use of broad spectrum spf 30+ sunscreen to sun-exposed areas.   History of Dysplastic Nevi - No evidence of recurrence today at left shoulder, moderate - Recommend regular full body skin exams - Recommend daily broad spectrum sunscreen SPF 30+ to sun-exposed areas, reapply every 2 hours as needed.  - Call if any new or changing lesions are noted between office visits  History of melanoma right scapula- linear scar clear   CLARK/ANATOMIC LEVEL: II, Breslows AT LEAST 0.3 MM, WLE 09/16/21 No lymphadenopathy. Clear. Observe for recurrence. Call clinic for new or changing lesions.  Recommend regular skin exams, daily broad-spectrum spf 30+ sunscreen use, and photoprotection.    Varicose Veins/Spider Veins - Dilated blue, purple or red veins at the lower extremities - Reassured - Smaller vessels can be treated by sclerotherapy (a procedure to inject a medicine into the veins to make them disappear) if desired, but the treatment is not covered by insurance. Larger vessels may be covered if symptomatic and we would refer to vascular surgeon if treatment desired.  Congenital nevus Exam: 8 mm medium light pink tan speckled papule with emerging hair at left lower anterior thigh   Benign-appearing. Stable. Observation.  Call clinic for new or changing moles.  Recommend daily use of broad spectrum spf 30+ sunscreen to sun-exposed areas.     Nevus vs SK Exam: 5 mm x 4 mm speckled brown papule with darker edge at lower sternum intermammary   Benign-appearing. Stable compared to previous visit. Observation.  Call  clinic for new or changing moles.  Recommend daily use of broad spectrum spf 30+ sunscreen to sun-exposed areas.    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  Benign. Observe. Continue daily compression stockings  Purpura  - Violaceous macule at left lower calf/ankle area - Benign - Related to trauma, age, sun damage and/or use of blood thinners, chronic use of topical and/or oral steroids - Observe - Can use OTC arnica containing moisturizer such as Dermend Bruise Formula if desired - Call for worsening or other concerns  Return in about 6 months (around 11/23/2023) for with Dr. Roseanne Reno, TBSE, Hx MM.  Anise Salvo, RMA, am acting as scribe for Willeen Niece, MD .   Documentation: I have reviewed the above documentation for accuracy and completeness, and I agree with the above.  Willeen Niece, MD

## 2023-12-01 ENCOUNTER — Encounter: Payer: Medicare Other | Admitting: Dermatology

## 2024-01-31 ENCOUNTER — Ambulatory Visit: Admission: EM | Admit: 2024-01-31 | Discharge: 2024-01-31 | Disposition: A | Discharge status: Home / Self Care

## 2024-01-31 ENCOUNTER — Ambulatory Visit: Payer: Self-pay | Admitting: Internal Medicine

## 2024-01-31 ENCOUNTER — Ambulatory Visit (INDEPENDENT_AMBULATORY_CARE_PROVIDER_SITE_OTHER)

## 2024-01-31 ENCOUNTER — Ambulatory Visit
Admission: EM | Admit: 2024-01-31 | Discharge: 2024-01-31 | Disposition: A | Discharge status: Home / Self Care | Attending: Internal Medicine | Admitting: Internal Medicine

## 2024-01-31 DIAGNOSIS — M25521 Pain in right elbow: Secondary | ICD-10-CM | POA: Diagnosis not present

## 2024-01-31 DIAGNOSIS — M7989 Other specified soft tissue disorders: Secondary | ICD-10-CM | POA: Diagnosis not present

## 2024-01-31 NOTE — Discharge Instructions (Signed)
 Please go to our [facility] urgent care location to have x-ray completed. I will call only if it is abnormal so please stay by your phone today. Recommend follow up if symptoms persist or worsen. You may also call primary care to set up an appointment. Use cool compresses to help decrease inflammation.

## 2024-01-31 NOTE — ED Triage Notes (Signed)
 Pt c/o right elbow pain and swelling since Thursday. Put cold compress.

## 2024-01-31 NOTE — ED Provider Notes (Signed)
 [name]    CSN: [id number] Arrival date & time: 01/31/24  1056      History   Chief Complaint No chief complaint on file.   HPI Audrey Hayden is a 73 y.o. female.   Patient presents with redness, warmth, pain, swelling to right elbow that started about 3-4 days ago. States that she was carrying boxes of books and cleaning a lot recently but denies any obvious injury. Denies any history of chronic pain in that elbow. Denies any associated fever. She has not taken any medication for pain.      Past Medical History:  Diagnosis Date   Allergy    Bronchiectasis (HCC)    Celiac disease    Dysplastic nevus 11/18/2022   left shoulder, moderate   GERD (gastroesophageal reflux disease)    Gross hematuria 02/25/2019   IBS (irritable bowel syndrome)    Melanoma (HCC) 09/03/2021   R scapula, CLARK/ANATOMIC LEVEL: II, Breslows AT LEAST 0.3 MM, WLE 09/16/21   Osteoporosis     Patient Active Problem List   Diagnosis Date Noted   Hiatal hernia 03/19/2023   Loose stools    Loss of weight    Thyroid  nodule 05/03/2021   Allergic rhinitis 04/11/2021   Adverse reaction to food 04/11/2021   Esophageal dysphagia    Schatzki's ring    Gastric erythema    Asymptomatic spider veins of both lower extremities 02/25/2019   Chronic left-sided thoracic back pain 12/09/2017   Anxiety 12/09/2017   Environmental and seasonal allergies 06/19/2015   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/19/2015   Osteoporosis 06/19/2015   Abnormal Pap smear of cervix 06/19/2015   Elevated HDL 06/19/2015   History of gastritis 11/08/2014   Cough, persistent 08/14/2014    Past Surgical History:  Procedure Laterality Date   BREAST BIOPSY Left    neg   COLONOSCOPY  2004   COLONOSCOPY WITH PROPOFOL  N/A 06/19/2021   Procedure: COLONOSCOPY WITH PROPOFOL ;  Surgeon: Irby Mannan, MD;  Location: [facility];  Service: Endoscopy;  Laterality: N/A;   ESOPHAGOGASTRODUODENOSCOPY (EGD)  WITH PROPOFOL  N/A 12/05/2020   Procedure: ESOPHAGOGASTRODUODENOSCOPY (EGD) WITH PROPOFOL ;  Surgeon: Irby Mannan, MD;  Location: [facility];  Service: Endoscopy;  Laterality: N/A;   TONSILLECTOMY     UPPER GI ENDOSCOPY  12/2018    OB History   No obstetric history on file.      Home Medications    Prior to Admission medications   Medication Sig Start Date End Date Taking? Authorizing Provider  cetirizine (ZYRTEC) 10 MG tablet Take 10 mg by mouth daily.    [provider]  dicyclomine  (BENTYL ) 20 MG tablet Take 1 tablet (20 mg total) by mouth every 6 (six) hours as needed (abdominal bloating, cramping, or pain). 04/22/23   Leopoldo Rancher, PA  Homeopathic Products (LIVER SUPPORT SL) Place under the tongue.    [provider]  Magnesium Oxide -Mg Supplement ([employer] MAGNESIUM) 500 MG TABS 1 tablet with a meal Orally Twice a day    [provider]  Multiple Vitamins-Minerals (ALIVE WOMENS 50+ PO)     [provider]  Pancreatin 500 MG CAPS Take by mouth. Digestive enzymes    [provider]  Probiotic Product (PROBIOTIC BLEND PO) in the morning, at noon, and at bedtime.    [provider]  Quercetin 250 MG TABS Take 500 mg by mouth at bedtime.    [provider]    Family History  Family History  Problem Relation Age of Onset   Hypertension Mother    CAD Father    Heart attack Father    Breast cancer Neg Hx    Colon cancer Neg Hx     Social History Social History   Tobacco Use   Smoking status: Former    Current packs/day: 0.00    Average packs/day: 1.5 packs/day for 5.6 years (8.4 ttl pk-yrs)    Types: Cigarettes    Start date: 36    Quit date: 04/02/1975    Years since quitting: 48.8   Smokeless tobacco: Never   Tobacco comments:    smoking cessation materials not required  Vaping Use   Vaping status: Never Used  Substance Use Topics   Alcohol use: No    Alcohol/week: 0.0 standard drinks of  alcohol   Drug use: No     Allergies   Egg-derived products, Amoxicillin, Azelastine  hcl, Cefdinir, Clarithromycin, Codeine, Hydromorphone hcl, Hydromorphone hcl, Ibuprofen, Meperidine, Montelukast, Moxifloxacin, Nsaids, Omeprazole , Other, Prednisone, Pseudoephedrine, Pseudoephedrine hcl, Ranitidine , Salicylates, Aspirin, Dexilant [dexlansoprazole], Doxycycline , and Montelukast sodium   Review of Systems Review of Systems Per HPI  Physical Exam Triage Vital Signs ED Triage Vitals  Encounter Vitals Group     BP 01/31/24 1112 121/76     Systolic BP Percentile --      Diastolic BP Percentile --      Pulse Rate 01/31/24 1112 68     Resp 01/31/24 1112 18     Temp 01/31/24 1112 97.9 F (36.6 C)     Temp Source 01/31/24 1112 Oral     SpO2 01/31/24 1112 96 %     Weight --      Height --      Head Circumference --      Peak Flow --      Pain Score 01/31/24 1108 8     Pain Loc --      Pain Education --      Exclude from Growth Chart --    No data found.  Updated Vital Signs BP 121/76 (BP Location: Left Arm)   Pulse 68   Temp 97.9 F (36.6 C) (Oral)   Resp 18   SpO2 96%   Visual Acuity Right Eye Distance:   Left Eye Distance:   Bilateral Distance:    Right Eye Near:   Left Eye Near:    Bilateral Near:     Physical Exam Constitutional:      General: She is not in acute distress.    Appearance: Normal appearance. She is not toxic-appearing or diaphoretic.  HENT:     Head: Normocephalic and atraumatic.  Eyes:     Extraocular Movements: Extraocular movements intact.     Conjunctiva/sclera: Conjunctivae normal.  Pulmonary:     Effort: Pulmonary effort is normal.  Musculoskeletal:     Comments: Patient has small area of redness, warmth, and mild swelling that is approximately 2 inches in diameter present overlying area of lateral epicondyle of right elbow. There is tenderness to palpation but patient has full ROM of elbow. Neurovascularly intact.   Neurological:      General: No focal deficit present.     Mental Status: She is alert and oriented to person, place, and time. Mental status is at baseline.  Psychiatric:        Mood and Affect: Mood normal.        Behavior: Behavior normal.        Thought Content: Thought content normal.  Judgment: Judgment normal.      UC Treatments / Results  Labs (all labs ordered are listed, but only abnormal results are displayed) Labs Reviewed - No data to display  EKG   Radiology DG Elbow Complete Right Result Date: 01/31/2024 CLINICAL DATA:  Pain and swelling to the elbow. EXAM: RIGHT ELBOW - COMPLETE 3+ VIEW COMPARISON:  None. FINDINGS: No evidence for an acute fracture. No subluxation or dislocation. No fat pad elevation to suggest joint effusion. IMPRESSION: Negative. Electronically Signed   By: Donnal Fusi M.D.   On: 01/31/2024 13:38    Procedures Procedures (including critical care time)  Medications Ordered in UC Medications - No data to display  Initial Impression / Assessment and Plan / UC Course  I have reviewed the triage vital signs and the nursing notes.  Pertinent labs & imaging results that were available during my care of the patient were reviewed by me and considered in my medical decision making (see chart for details).     Differential diagnoses include insect bite, spider bite, tendonitis, osteoarthritis, gout, septic joint. X-ray of elbow was completed today to rule out septic joint, and it was negative for any acute abnormality. I highly suspect insect versus spider bite given appearance on physical exam. No sign of abscess or infection so antibiotic therapy is not warranted at this time.No signs of systemic symptoms at this time as well. Advised patient to use cool compresses to help decrease swelling and to help with comfort. Patient has an extensive list of allergies so advised tylenol for pain as she reports that she can safely take this. Recommended strict follow up  precautions and encouraged patient to make an appointment with PCP. Patient verbalized understanding and was agreeable with plan.  Final Clinical Impressions(s) / UC Diagnoses   Final diagnoses:  Right elbow pain     Discharge Instructions      Please go to our [facility] urgent care location to have x-ray completed. I will call only if it is abnormal so please stay by your phone today. Recommend follow up if symptoms persist or worsen. You may also call primary care to set up an appointment. Use cool compresses to help decrease inflammation.   ED Prescriptions   None    PDMP not reviewed this encounter.   Dodson Fre[place], [place] 01/31/24 1357

## 2024-02-02 ENCOUNTER — Telehealth: Payer: Self-pay

## 2024-06-23 ENCOUNTER — Ambulatory Visit: Admission: EM | Admit: 2024-06-23 | Discharge: 2024-06-23 | Disposition: A | Discharge status: Home / Self Care

## 2024-06-23 DIAGNOSIS — M79604 Pain in right leg: Secondary | ICD-10-CM

## 2024-06-23 DIAGNOSIS — I8391 Asymptomatic varicose veins of right lower extremity: Secondary | ICD-10-CM

## 2024-06-23 DIAGNOSIS — I8392 Asymptomatic varicose veins of left lower extremity: Secondary | ICD-10-CM | POA: Diagnosis not present

## 2024-06-23 NOTE — Discharge Instructions (Signed)
 I suspect your pain is due to a vein issue. A referral to a vein specialist has been placed. Please follow up with them. They should call  you but if they do not, please call them yourself to schedule the appointment.

## 2024-06-23 NOTE — ED Provider Notes (Signed)
 [name]    CSN: [id number] Arrival date & time: 06/23/24  1119      History   Chief Complaint Chief Complaint  Patient presents with   Insect Bite    HPI Audrey Hayden is a 73 y.o. female.   Patient presents with a concern of bug bite behind right knee that she noticed around October 9. She did not feel or witness anything bite her. She is concerned for a spider bite given she was seen at urgent care a few months prior with the same concern. She has taken some benadryl which she thinks has provided some relief.      Past Medical History:  Diagnosis Date   Allergy    Bronchiectasis (HCC)    Celiac disease    Dysplastic nevus 11/18/2022   left shoulder, moderate   GERD (gastroesophageal reflux disease)    Gross hematuria 02/25/2019   IBS (irritable bowel syndrome)    Melanoma (HCC) 09/03/2021   R scapula, CLARK/ANATOMIC LEVEL: II, Breslows AT LEAST 0.3 MM, WLE 09/16/21   Osteoporosis     Patient Active Problem List   Diagnosis Date Noted   Hiatal hernia 03/19/2023   Loose stools    Loss of weight    Thyroid  nodule 05/03/2021   Allergic rhinitis 04/11/2021   Adverse reaction to food 04/11/2021   Esophageal dysphagia    Schatzki's ring    Gastric erythema    Asymptomatic spider veins of both lower extremities 02/25/2019   Chronic left-sided thoracic back pain 12/09/2017   Anxiety 12/09/2017   Environmental and seasonal allergies 06/19/2015   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/19/2015   Osteoporosis 06/19/2015   Abnormal Pap smear of cervix 06/19/2015   Elevated HDL 06/19/2015   History of gastritis 11/08/2014   Cough, persistent 08/14/2014    Past Surgical History:  Procedure Laterality Date   BREAST BIOPSY Left    neg   COLONOSCOPY  2004   COLONOSCOPY WITH PROPOFOL  N/A 06/19/2021   Procedure: COLONOSCOPY WITH PROPOFOL ;  Surgeon: Janalyn Keene NOVAK, MD;  Location: [facility];  Service: Endoscopy;  Laterality: N/A;    ESOPHAGOGASTRODUODENOSCOPY (EGD) WITH PROPOFOL  N/A 12/05/2020   Procedure: ESOPHAGOGASTRODUODENOSCOPY (EGD) WITH PROPOFOL ;  Surgeon: Janalyn Keene NOVAK, MD;  Location: [facility];  Service: Endoscopy;  Laterality: N/A;   TONSILLECTOMY     UPPER GI ENDOSCOPY  12/2018    OB History   No obstetric history on file.      Home Medications    Prior to Admission medications   Medication Sig Start Date End Date Taking? Authorizing Provider  cetirizine (ZYRTEC) 10 MG tablet Take 10 mg by mouth daily.   Yes [provider]  Homeopathic Products (LIVER SUPPORT SL) Place under the tongue.   Yes [provider]  LYSINE ACETATE PO Take by mouth.   Yes [provider]  Magnesium Oxide -Mg Supplement ([employer] MAGNESIUM) 500 MG TABS 1 tablet with a meal Orally Twice a day   Yes [provider]  Multiple Vitamins-Minerals (ALIVE WOMENS 50+ PO)    Yes [provider]  Pancreatin 500 MG CAPS Take by mouth. Digestive enzymes   Yes [provider]  Probiotic Product (PROBIOTIC BLEND PO) in the morning, at noon, and at bedtime.   Yes [provider]  Sodium Hyaluronate, oral, (HYALURONIC ACID PO) Take by mouth.   Yes [provider]  dicyclomine  (BENTYL ) 20 MG tablet Take 1 tablet (20 mg total) by mouth every  6 (six) hours as needed (abdominal bloating, cramping, or pain). 04/22/23   Manya Toribio SQUIBB, PA  Quercetin 250 MG TABS Take 500 mg by mouth at bedtime.    [provider]    Family History Family History  Problem Relation Age of Onset   Hypertension Mother    CAD Father    Heart attack Father    Breast cancer Neg Hx    Colon cancer Neg Hx     Social History Social History   Tobacco Use   Smoking status: Former    Current packs/day: 0.00    Average packs/day: 1.5 packs/day for 5.6 years (8.4 ttl pk-yrs)    Types: Cigarettes    Start date: 39    Quit date: 04/02/1975    Years since quitting: 49.2   Smokeless  tobacco: Never   Tobacco comments:    smoking cessation materials not required  Vaping Use   Vaping status: Never Used  Substance Use Topics   Alcohol use: No    Alcohol/week: 0.0 standard drinks of alcohol   Drug use: No     Allergies   Egg protein-containing drug products, Amoxicillin, Azelastine  hcl, Cefdinir, Clarithromycin, Codeine, Hydromorphone hcl, Hydromorphone hcl, Ibuprofen, Meperidine, Montelukast, Moxifloxacin, Nsaids, Omeprazole , Other, Prednisone, Pseudoephedrine, Pseudoephedrine hcl, Ranitidine , Salicylates, Aspirin, Dexilant [dexlansoprazole], Doxycycline , and Montelukast sodium   Review of Systems Review of Systems Per HPI  Physical Exam Triage Vital Signs ED Triage Vitals [06/23/24 1129]  Encounter Vitals Group     BP (!) 148/76     Girls Systolic BP Percentile      Girls Diastolic BP Percentile      Boys Systolic BP Percentile      Boys Diastolic BP Percentile      Pulse Rate 70     Resp 18     Temp 97.9 F (36.6 C)     Temp Source Oral     SpO2 96 %     Weight      Height      Head Circumference      Peak Flow      Pain Score 0     Pain Loc      Pain Education      Exclude from Growth Chart    No data found.  Updated Vital Signs BP (!) 148/76 (BP Location: Left Arm)   Pulse 70   Temp 97.9 F (36.6 C) (Oral)   Resp 18   SpO2 96%   Visual Acuity Right Eye Distance:   Left Eye Distance:   Bilateral Distance:    Right Eye Near:   Left Eye Near:    Bilateral Near:     Physical Exam Constitutional:      General: She is not in acute distress.    Appearance: Normal appearance. She is not toxic-appearing or diaphoretic.  HENT:     Head: Normocephalic and atraumatic.  Eyes:     Extraocular Movements: Extraocular movements intact.     Conjunctiva/sclera: Conjunctivae normal.  Pulmonary:     Effort: Pulmonary effort is normal.  Skin:        Comments: There is no obvious evidence of insect or spider bite. No swelling or redness  noted. Patient points to right lower posterior knee when asked where pain is occurring. There is some bluish discoloration suspicious of varicose veins present in this area. Multiple other areas of varicose veins noted throughout as well. Pedal pulses and capillary refill normal. Patient is able to ambulate without  difficulty.   Neurological:     General: No focal deficit present.     Mental Status: She is alert and oriented to person, place, and time. Mental status is at baseline.  Psychiatric:        Mood and Affect: Mood normal.        Behavior: Behavior normal.        Thought Content: Thought content normal.        Judgment: Judgment normal.      UC Treatments / Results  Labs (all labs ordered are listed, but only abnormal results are displayed) Labs Reviewed - No data to display  EKG   Radiology No results found.  Procedures Procedures (including critical care time)  Medications Ordered in UC Medications - No data to display  Initial Impression / Assessment and Plan / UC Course  I have reviewed the triage vital signs and the nursing notes.  Pertinent labs & imaging results that were available during my care of the patient were reviewed by me and considered in my medical decision making (see chart for details).     I do not see any evidence of spider or insect bite on exam. I suspect pain is result of varicose vein complication. Do not have any concern for DVT or cellulitis given there is no redness or swelling on exam and patient is neurovascularly intact. I do think patient would benefit from seeing a vein specialist given I have concern of pain being caused by varicose veins. Therefore, ambulatory referral to vein clinic was placed. Patient was advised that if they do not call to schedule the appointment, she is to call them at provided contact information. Advised supportive care and strict return precautions. Patient verbalized understanding and was agreeable with plan.   Final Clinical Impressions(s) / UC Diagnoses   Final diagnoses:  Varicose veins of left lower extremity, unspecified whether complicated  Right leg pain     Discharge Instructions      I suspect your pain is due to a vein issue. A referral to a vein specialist has been placed. Please follow up with them. They should call  you but if they do not, please call them yourself to schedule the appointment.    ED Prescriptions   None    PDMP not reviewed this encounter.   Hazen Darryle BRAVO, [place] 06/23/24 1159

## 2024-06-23 NOTE — ED Triage Notes (Signed)
 Pt c/o suspected insect bite behind right knee.First noticed it October 9.Took benadryl with some relief

## 2024-07-14 DIAGNOSIS — M79661 Pain in right lower leg: Secondary | ICD-10-CM | POA: Diagnosis not present

## 2024-07-14 DIAGNOSIS — I83893 Varicose veins of bilateral lower extremities with other complications: Secondary | ICD-10-CM | POA: Diagnosis not present

## 2024-07-14 DIAGNOSIS — I87393 Chronic venous hypertension (idiopathic) with other complications of bilateral lower extremity: Secondary | ICD-10-CM | POA: Diagnosis not present

## 2024-07-14 DIAGNOSIS — I8311 Varicose veins of right lower extremity with inflammation: Secondary | ICD-10-CM | POA: Diagnosis not present

## 2024-07-14 DIAGNOSIS — I8312 Varicose veins of left lower extremity with inflammation: Secondary | ICD-10-CM | POA: Diagnosis not present
